# Patient Record
Sex: Female | Born: 1940
Health system: Southern US, Community
[De-identification: ages and names within clinical notes are randomized; demographics above are authoritative.]

## PROBLEM LIST (undated history)

## (undated) DIAGNOSIS — F411 Generalized anxiety disorder: Secondary | ICD-10-CM

## (undated) DIAGNOSIS — I10 Essential (primary) hypertension: Secondary | ICD-10-CM

## (undated) DIAGNOSIS — J189 Pneumonia, unspecified organism: Secondary | ICD-10-CM

## (undated) DIAGNOSIS — F329 Major depressive disorder, single episode, unspecified: Secondary | ICD-10-CM

## (undated) DIAGNOSIS — N289 Disorder of kidney and ureter, unspecified: Secondary | ICD-10-CM

## (undated) DIAGNOSIS — F419 Anxiety disorder, unspecified: Secondary | ICD-10-CM

## (undated) DIAGNOSIS — M47812 Spondylosis without myelopathy or radiculopathy, cervical region: Secondary | ICD-10-CM

## (undated) DIAGNOSIS — F32A Depression, unspecified: Secondary | ICD-10-CM

## (undated) DIAGNOSIS — H269 Unspecified cataract: Secondary | ICD-10-CM

## (undated) HISTORY — DX: Major depressive disorder, single episode, unspecified: F32.9

## (undated) HISTORY — PX: EYE SURGERY: SHX253

## (undated) HISTORY — DX: Depression, unspecified: F32.A

## (undated) HISTORY — DX: Disorder of kidney and ureter, unspecified: N28.9

## (undated) HISTORY — DX: Generalized anxiety disorder: F41.1

## (undated) HISTORY — PX: TONSILLECTOMY: SUR1361

## (undated) HISTORY — DX: Anxiety disorder, unspecified: F41.9

## (undated) HISTORY — DX: Unspecified cataract: H26.9

## (undated) HISTORY — PX: CLAVICLE SURGERY: SHX598

## (undated) HISTORY — DX: Essential (primary) hypertension: I10

## (undated) HISTORY — DX: Spondylosis without myelopathy or radiculopathy, cervical region: M47.812

## (undated) HISTORY — PX: COLONOSCOPY: SHX174

---

## 1998-01-04 ENCOUNTER — Other Ambulatory Visit: Admission: RE | Admit: 1998-01-04 | Discharge: 1998-01-04 | Payer: Self-pay | Admitting: *Deleted

## 1998-11-09 ENCOUNTER — Ambulatory Visit (HOSPITAL_COMMUNITY): Admission: RE | Admit: 1998-11-09 | Discharge: 1998-11-09 | Payer: Self-pay | Admitting: Gastroenterology

## 1999-04-19 ENCOUNTER — Other Ambulatory Visit: Admission: RE | Admit: 1999-04-19 | Discharge: 1999-04-19 | Payer: Self-pay | Admitting: *Deleted

## 2000-01-24 ENCOUNTER — Encounter: Admission: RE | Admit: 2000-01-24 | Discharge: 2000-01-24 | Payer: Self-pay | Admitting: *Deleted

## 2000-01-24 ENCOUNTER — Encounter: Payer: Self-pay | Admitting: *Deleted

## 2000-10-31 ENCOUNTER — Other Ambulatory Visit: Admission: RE | Admit: 2000-10-31 | Discharge: 2000-10-31 | Payer: Self-pay | Admitting: *Deleted

## 2001-05-15 ENCOUNTER — Encounter: Payer: Self-pay | Admitting: *Deleted

## 2001-05-15 ENCOUNTER — Encounter: Admission: RE | Admit: 2001-05-15 | Discharge: 2001-05-15 | Payer: Self-pay | Admitting: *Deleted

## 2001-10-16 ENCOUNTER — Other Ambulatory Visit: Admission: RE | Admit: 2001-10-16 | Discharge: 2001-10-16 | Payer: Self-pay | Admitting: *Deleted

## 2002-11-05 ENCOUNTER — Other Ambulatory Visit: Admission: RE | Admit: 2002-11-05 | Discharge: 2002-11-05 | Payer: Self-pay | Admitting: Family Medicine

## 2002-11-24 ENCOUNTER — Encounter: Admission: RE | Admit: 2002-11-24 | Discharge: 2002-11-24 | Payer: Self-pay

## 2003-10-05 ENCOUNTER — Other Ambulatory Visit: Admission: RE | Admit: 2003-10-05 | Discharge: 2003-10-05 | Payer: Self-pay | Admitting: Obstetrics and Gynecology

## 2011-11-13 ENCOUNTER — Other Ambulatory Visit: Payer: Self-pay | Admitting: Family Medicine

## 2011-11-13 NOTE — Telephone Encounter (Signed)
Pt left medicines in [place],  Needs a few pills to get through to Friday when she can come to see Dr. Charna Elizabeth 50-12.5  [facility]  Phone [phone]

## 2011-11-18 ENCOUNTER — Other Ambulatory Visit: Payer: Self-pay | Admitting: Family Medicine

## 2011-11-30 ENCOUNTER — Encounter: Payer: Self-pay | Admitting: Physician Assistant

## 2011-11-30 ENCOUNTER — Ambulatory Visit (INDEPENDENT_AMBULATORY_CARE_PROVIDER_SITE_OTHER): Payer: Medicare Other | Admitting: Physician Assistant

## 2011-11-30 VITALS — BP 146/85 | HR 68 | Temp 97.8°F | Resp 18 | Ht 61.5 in | Wt 139.0 lb

## 2011-11-30 DIAGNOSIS — N289 Disorder of kidney and ureter, unspecified: Secondary | ICD-10-CM | POA: Insufficient documentation

## 2011-11-30 DIAGNOSIS — I10 Essential (primary) hypertension: Secondary | ICD-10-CM

## 2011-11-30 DIAGNOSIS — F341 Dysthymic disorder: Secondary | ICD-10-CM

## 2011-11-30 DIAGNOSIS — F329 Major depressive disorder, single episode, unspecified: Secondary | ICD-10-CM

## 2011-11-30 LAB — POCT URINALYSIS DIPSTICK
Bilirubin, UA: NEGATIVE
Ketones, UA: NEGATIVE
Leukocytes, UA: NEGATIVE
Spec Grav, UA: 1.01

## 2011-11-30 LAB — POCT UA - MICROSCOPIC ONLY
Bacteria, U Microscopic: NEGATIVE
Crystals, Ur, HPF, POC: NEGATIVE
WBC, Ur, HPF, POC: NEGATIVE

## 2011-11-30 LAB — POCT CBC
Granulocyte percent: 52.8 %G (ref 37–80)
HCT, POC: 38.5 % (ref 37.7–47.9)
Hemoglobin: 12.3 g/dL (ref 12.2–16.2)
Lymph, poc: 2.6 (ref 0.6–3.4)
MCH, POC: 30.7 pg (ref 27–31.2)
MCHC: 31.9 g/dL (ref 31.8–35.4)
MCV: 96 fL (ref 80–97)
MID (cbc): 0.9 (ref 0–0.9)
MPV: 8.3 fL (ref 0–99.8)
POC Granulocyte: 3.9 (ref 2–6.9)
POC LYMPH PERCENT: 35.7 %L (ref 10–50)
POC MID %: 11.5 %M (ref 0–12)
Platelet Count, POC: 330 10*3/uL (ref 142–424)
RBC: 4.01 M/uL — AB (ref 4.04–5.48)
RDW, POC: 12.5 %
WBC: 7.4 10*3/uL (ref 4.6–10.2)

## 2011-11-30 MED ORDER — LOSARTAN POTASSIUM-HCTZ 50-12.5 MG PO TABS: 1.0000 | ORAL_TABLET | Freq: Every day | ORAL | Status: DC

## 2011-11-30 MED ORDER — CITALOPRAM HYDROBROMIDE 40 MG PO TABS: 40.0000 mg | ORAL_TABLET | Freq: Every day | ORAL | Status: DC

## 2011-11-30 NOTE — Progress Notes (Signed)
Subjective:    Patient ID: Allison Chavez, female    DOB: Jan 20, 1941, 71 y.o.   MRN: [id number]  HPI  Presents for follow-up of HTN and anxiety and depression.  Last seen here 08/29/2010.  I last saw her in 09/2009. Had a mammogram last month. She discontinued the cholesterol medication after a physician friend told her she didn't need it and a physician here told her of the potential adverse effects of the statins. She took zolpidem briefly, but decided it "wasn't for me." Has been taking citalopram intermittently x 6-8 weeks, notices that she feels "wound up."  Would like to restart it, but doesn't plan to restart the tranxene if she doesn't have to. All three of her children are living in the [place] area now.  Her daughter is in the process of converting to Judaism, which the patient is happy with.  She has a great relationship with her granddaughters. She is still in relationship with her 3rd husband, though they have not lived together for some years. He's doing some neat work Agricultural consultant at [employer], non[place] [place] of the [place]. 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  Review of Systems No chest pain, SOB, HA, dizziness, vision change, N/V, diarrhea, dysuria, or rash.  Intermittent cramps in the outer aspects of both legs at night.     Objective:   Physical Exam  Vital signs noted. Well-developed, well nourished WF who is awake, alert and oriented, in NAD. HEENT: [place]/AT, PERRL, EOMI.  Sclera and conjunctiva are clear.  Funduscopic exam is normal bilaterally.EAC are patent, TMs are normal in appearance. Nasal mucosa is pink and moist. OP is clear. Neck: supple, non-tender, no lymphadenopathey, thyromegaly. Heart: RRR, no murmur Lungs: CTA Extremities: no cyanosis, clubbing or edema. Skin: warm and dry without rash.  Results for orders placed in visit on 11/30/11  POCT CBC      Component  Value Range   WBC 7.4  4.6 - 10.2 (K/uL)   Lymph, poc 2.6  0.6 - 3.4    POC LYMPH PERCENT 35.7  10 - 50 (%L)   MID (cbc) 0.9  0 - 0.9    POC MID % 11.5  0 - 12 (%M)   POC Granulocyte 3.9  2 - 6.9    Granulocyte percent 52.8  37 - 80 (%G)   RBC 4.01 (*) 4.04 - 5.48 (M/uL)   Hemoglobin 12.3  12.2 - 16.2 (g/dL)   HCT, POC 40.9  81.1 - 47.9 (%)   MCV 96.0  80 - 97 (fL)   MCH, POC 30.7  27 - 31.2 (pg)   MCHC 31.9  31.8 - 35.4 (g/dL)   RDW, POC 91.4     Platelet Count, POC 330  142 - 424 (K/uL)   MPV 8.3  0 - 99.8 (fL)  POCT UA - MICROSCOPIC ONLY      Component Value Range   WBC, Ur, HPF, POC neg     RBC, urine, microscopic 0-1     Bacteria, U Microscopic neg     Mucus, UA neg     Epithelial cells, urine per micros neg     Crystals, Ur, HPF, POC neg     Casts, Ur, LPF, POC neg     Yeast, UA neg    POCT URINALYSIS DIPSTICK      Component Value Range   Color, UA yellow     Clarity, UA  clear     Glucose, UA neg     Bilirubin, UA neg     Ketones, UA neg     Spec Grav, UA 1.010     Blood, UA trace-lysed     pH, UA 7.0     Protein, UA neg     Urobilinogen, UA 0.2     Nitrite, UA neg     Leukocytes, UA Negative          Assessment & Plan:   1. HTN (hypertension)  losartan-hydrochlorothiazide (HYZAAR) 50-12.5 MG per tablet, POCT CBC, POCT UA - Microscopic Only, POCT urinalysis dipstick, Comprehensive metabolic panel, Lipid panel, TSH  2. Anxiety and depression  citalopram (CELEXA) 40 MG tablet   Recheck and CPE in 3 months. If sBP remains above 140, will increase losartanHCT dose.

## 2011-11-30 NOTE — Patient Instructions (Signed)
Regular exercise, healthy eating, regular sunshine and fun!

## 2011-12-01 ENCOUNTER — Encounter: Payer: Self-pay | Admitting: Physician Assistant

## 2011-12-01 LAB — COMPREHENSIVE METABOLIC PANEL
ALT: 15 U/L (ref 0–35)
Albumin: 4.4 g/dL (ref 3.5–5.2)
CO2: 27 mEq/L (ref 19–32)
Calcium: 9.4 mg/dL (ref 8.4–10.5)
Chloride: 102 mEq/L (ref 96–112)
Glucose, Bld: 87 mg/dL (ref 70–99)
Potassium: 4.5 mEq/L (ref 3.5–5.3)
Sodium: 137 mEq/L (ref 135–145)
Total Protein: 6.8 g/dL (ref 6.0–8.3)

## 2011-12-01 LAB — LIPID PANEL
Cholesterol: 212 mg/dL — ABNORMAL HIGH (ref 0–200)
Triglycerides: 70 mg/dL (ref <150)
VLDL: 14 mg/dL (ref 0–40)

## 2011-12-05 NOTE — Progress Notes (Signed)
CPE scheduled with Dr. Audria Nine for 12/26/11. Allison Chavez

## 2011-12-26 ENCOUNTER — Encounter: Payer: Medicare Other | Admitting: Family Medicine

## 2012-02-18 ENCOUNTER — Telehealth: Payer: Self-pay

## 2012-02-18 NOTE — Telephone Encounter (Signed)
There should be 3 refills. If not we can authorize a refill to get her through until August, but have her check with the pharmacy.

## 2012-02-18 NOTE — Telephone Encounter (Signed)
PT CALLED AND STATES SHE WILL BE OUT OF GENERIC CELEXA BEFORE HER APPT ON 03/07/12. PT IS ASKING OF CHELLE CAN REFILL UNTIL THE APPT PT USES [facility] ON [address]

## 2012-02-19 NOTE — Telephone Encounter (Signed)
Called [facility] to check on refills, spoke with Rosanne Ashing she has refills and one is ready for her to pick up now. Spoke with pt advised to pick up RX

## 2012-03-07 ENCOUNTER — Ambulatory Visit (INDEPENDENT_AMBULATORY_CARE_PROVIDER_SITE_OTHER): Payer: Medicare Other | Admitting: Family Medicine

## 2012-03-07 ENCOUNTER — Encounter: Payer: Self-pay | Admitting: Family Medicine

## 2012-03-07 VITALS — BP 114/77 | HR 64 | Temp 97.5°F | Resp 16 | Ht 62.0 in | Wt 138.0 lb

## 2012-03-07 DIAGNOSIS — Z0000 Encounter for general adult medical examination without abnormal findings: Secondary | ICD-10-CM

## 2012-03-07 DIAGNOSIS — I10 Essential (primary) hypertension: Secondary | ICD-10-CM | POA: Insufficient documentation

## 2012-03-07 DIAGNOSIS — F341 Dysthymic disorder: Secondary | ICD-10-CM

## 2012-03-07 DIAGNOSIS — F418 Other specified anxiety disorders: Secondary | ICD-10-CM

## 2012-03-07 HISTORY — DX: Essential (primary) hypertension: I10

## 2012-03-07 LAB — POCT URINALYSIS DIPSTICK
Bilirubin, UA: NEGATIVE
Glucose, UA: NEGATIVE
Ketones, UA: NEGATIVE
Leukocytes, UA: NEGATIVE
Protein, UA: NEGATIVE

## 2012-03-07 LAB — IFOBT (OCCULT BLOOD): IFOBT: NEGATIVE

## 2012-03-07 MED ORDER — IBUPROFEN 600 MG PO TABS: 600.0000 mg | ORAL_TABLET | Freq: Three times a day (TID) | ORAL | Status: AC | PRN

## 2012-03-07 MED ORDER — ZOLPIDEM TARTRATE 5 MG PO TABS: 5.0000 mg | ORAL_TABLET | Freq: Every evening | ORAL | Status: DC | PRN

## 2012-03-07 NOTE — Patient Instructions (Signed)
Keeping You Healthy  Get These Tests  Blood Pressure- Have your blood pressure checked by your healthcare provider at least once a year.  Normal blood pressure is 120/80.  Weight- Have your body mass index (BMI) calculated to screen for obesity.  BMI is a measure of body fat based on height and weight.  You can calculate your own BMI at www.nhlbisupport.com/bmi/  Cholesterol- Have your cholesterol checked every year.  Diabetes- Have your blood sugar checked every year if you have high blood pressure, high cholesterol, a family history of diabetes or if you are overweight.  Pap Smear- Have a pap smear every 1 to 3 years if you have been sexually active.  If you are older than 65 and recent pap smears have been normal you may not need additional pap smears.  In addition, if you have had a hysterectomy  For benign disease additional pap smears are not necessary.  Mammogram-Yearly mammograms are essential for early detection of breast cancer  Screening for Colon Cancer- Colonoscopy starting at age 50. Screening may begin sooner depending on your family history and other health conditions.  Follow up colonoscopy as directed by your Gastroenterologist.  Screening for Osteoporosis- Screening begins at age 65 with bone density scanning, sooner if you are at higher risk for developing Osteoporosis.  Get these medicines  Calcium with Vitamin D- Your body requires 1200-1500 mg of Calcium a day and 800-1000 IU of Vitamin D a day.  You can only absorb 500 mg of Calcium at a time therefore Calcium must be taken in 2 or 3 separate doses throughout the day.  Hormones- Hormone therapy has been associated with increased risk for certain cancers and heart disease.  Talk to your healthcare provider about if you need relief from menopausal symptoms.  Aspirin- Ask your healthcare provider about taking Aspirin to prevent Heart Disease and Stroke.  Get these Immuniztions  Flu shot- Every fall  Pneumonia  shot- Once after the age of 65; if you are younger ask your healthcare provider if you need a pneumonia shot.  Tetanus- Every ten years.  Zostavax- Once after the age of 60 to prevent shingles.  Take these steps  Don't smoke- Your healthcare provider can help you quit. For tips on how to quit, ask your healthcare provider or go to www.smokefree.gov or call 1-800 QUIT-NOW.  Be physically active- Exercise 5 days a week for a minimum of 30 minutes.  If you are not already physically active, start slow and gradually work up to 30 minutes of moderate physical activity.  Try walking, dancing, bike riding, swimming, etc.  Eat a healthy diet- Eat a variety of healthy foods such as fruits, vegetables, whole grains, low fat milk, low fat cheeses, yogurt, lean meats, chicken, fish, eggs, dried beans, tofu, etc.  For more information go to www.thenutritionsource.org  Dental visit- Brush and floss teeth twice daily; visit your dentist twice a year.  Eye exam- Visit your Optometrist or Ophthalmologist yearly.  Drink alcohol in moderation- Limit alcohol intake to one drink or less a day.  Never drink and drive.  Depression- Your emotional health is as important as your physical health.  If you're feeling down or losing interest in things you normally enjoy, please talk to your healthcare provider.  Seat Belts- can save your life; always wear one  Smoke/Carbon Monoxide detectors- These detectors need to be installed on the appropriate level of your home.  Replace batteries at least once a year.  Violence- If anyone   is threatening or hurting you, please tell your healthcare provider.  Living Will/ Health care power of attorney- Discuss with your healthcare provider and family.    Depression  Depression is a strong emotion of feeling unhappy that can last for weeks, months, or even longer. Depression causes problems with the ability to function in life. It upsets your:   Relationships.   Sleep.     Eating habits.   Work habits.  HOME CARE  Take all medicine as told by your doctor.   Talk with a therapist, counselor, or friend.   Eat a healthy diet.   Exercise regularly.   Do not drink alcohol or use drugs.  GET HELP RIGHT AWAY IF: You start to have thoughts about hurting yourself or others. MAKE SURE YOU:  Understand these instructions.   Will watch your condition.   Will get help right away if you are not doing well or get worse.  Document Released: 08/18/2010 Document Revised: 07/05/2011 Document Reviewed: 08/18/2010 [facility] Patient Information 2012 [place], [place].   Anxiety and Panic Attacks Anxiety is your body's way of reacting to real danger or something you think is a danger. It may be fear or worry over a situation like losing your job. Sometimes the cause is not known. A panic attack is made up of physical signs like sweating, shaking, or chest pain. Anxiety and panic attacks may start suddenly. They may be strong. They may come at any time of day, even while sleeping. They may come at any time of life. Panic attacks are scary, but they do not harm you physically.  HOME CARE  Avoid any known causes of your anxiety.   Try to relax. Yoga may help. Tell yourself everything will be okay.   Exercise often.   Get expert advice and help (therapy) to stop anxiety or attacks from happening.   Avoid caffeine, alcohol, and drugs.   Only take medicine as told by your doctor.  GET HELP RIGHT AWAY IF:  Your attacks seem different than normal attacks.   Your problems are getting worse or concern you.  MAKE SURE YOU:  Understand these instructions.   Will watch your condition.   Will get help right away if you are not doing well or get worse.  Document Released: 08/18/2010 Document Revised: 07/05/2011 Document Reviewed: 08/18/2010 [facility] Patient Information 2012 [place], [place].

## 2012-03-18 NOTE — Progress Notes (Signed)
Subjective:    Patient ID: Allison Chavez, female    DOB: 1940/09/10, 71 y.o.   MRN: [id number]  HPI  This 71 y.o. Cauc female is here for Annual Wellness Visit- Medicare (no PAP). She has HTN  and depression with anxiety and is compliant with medications without side effects. She has a  history of elevated lipids.  She is a nonsmoker and does consume alcohol in moderate amount. She is not sexually active  though she is married.    Last PAP: ? (normal)- has been in last 10 years  Last MMG: 2013 (normal)  Last Colonoscopy: 2008 (normal)   Review of Systems  Constitutional: Negative for activity change, appetite change, fatigue and unexpected weight change.  Eyes: Negative for visual disturbance.  Respiratory: Negative for chest tightness and shortness of breath.   Cardiovascular: Negative for chest pain and palpitations.  Gastrointestinal: Negative for abdominal pain.  Neurological: Negative for dizziness, syncope, weakness, light-headedness, numbness and headaches.  All other systems reviewed and are negative.       Objective:   Physical Exam  Nursing note and vitals reviewed. Constitutional: She is oriented to person, place, and time. She appears well-developed and well-nourished. No distress.  HENT:  Head: Normocephalic and atraumatic.  Right Ear: Hearing, tympanic membrane, external ear and ear canal normal.  Left Ear: Hearing, tympanic membrane, external ear and ear canal normal.  Nose: Nose normal. No nasal deformity or septal deviation.  Mouth/Throat: Uvula is midline, oropharynx is clear and moist and mucous membranes are normal. No oral lesions. No dental caries.  Eyes: Conjunctivae, EOM and lids are normal. Pupils are equal, round, and reactive to light. Right eye exhibits no discharge. Left eye exhibits no discharge. Right conjunctiva is not injected. Left conjunctiva is not injected. No scleral icterus.  Fundoscopic exam:      The right eye shows no exudate, no  hemorrhage and no papilledema.       The left eye shows no exudate, no hemorrhage and no papilledema.  Neck: Normal range of motion. Neck supple. No JVD present. No thyromegaly present.  Cardiovascular: Normal rate, regular rhythm, normal heart sounds and intact distal pulses.  Exam reveals no gallop and no friction rub.   No murmur heard. Pulmonary/Chest: Effort normal and breath sounds normal. No respiratory distress. She has no rales. Right breast exhibits no inverted nipple, no mass, no nipple discharge, no skin change and no tenderness. Left breast exhibits no inverted nipple, no mass, no nipple discharge, no skin change and no tenderness.  Abdominal: Soft. Bowel sounds are normal. She exhibits no distension and no mass. There is no hepatosplenomegaly. There is no tenderness. There is no guarding and no CVA tenderness.  Genitourinary: Rectal exam shows external hemorrhoid. Rectal exam shows no fissure, no mass, no tenderness and anal tone normal. Guaiac negative stool. There is no rash, tenderness or lesion on the right labia. There is no rash, tenderness or lesion on the left labia.  Musculoskeletal: Normal range of motion. She exhibits no edema and no tenderness.  Lymphadenopathy:    She has no cervical adenopathy.  Neurological: She is alert and oriented to person, place, and time. She has normal reflexes. No cranial nerve deficit. She exhibits normal muscle tone. Coordination normal.  Skin: Skin is warm and dry. No rash noted. No pallor.  Psychiatric: She has a normal mood and affect. Her behavior is normal. Judgment and thought content normal.    BECK'S SCALE: score= 1  Assessment & Plan:   1. Routine general medical examination at a health care facility  POCT urinalysis dipstick, IFOBT POC (occult bld, rslt in office)  2. HTN (hypertension) - stable Continue Losartan- HCTZ  50-12.5 mg daily CMET in May 2013- normal labs Lipids in May 2013- elevated TC=212 , HDL= 89 LDL=109    3. Depression with anxiety  Continue Citalopram 40 mg daily

## 2012-04-09 ENCOUNTER — Other Ambulatory Visit: Payer: Self-pay | Admitting: Physician Assistant

## 2012-05-25 ENCOUNTER — Ambulatory Visit (INDEPENDENT_AMBULATORY_CARE_PROVIDER_SITE_OTHER): Payer: Medicare Other | Admitting: Family Medicine

## 2012-05-25 VITALS — BP 153/76 | HR 66 | Temp 98.7°F | Resp 16 | Ht 62.0 in | Wt 138.0 lb

## 2012-05-25 DIAGNOSIS — S61209A Unspecified open wound of unspecified finger without damage to nail, initial encounter: Secondary | ICD-10-CM

## 2012-05-25 DIAGNOSIS — M79609 Pain in unspecified limb: Secondary | ICD-10-CM

## 2012-05-25 DIAGNOSIS — M79646 Pain in unspecified finger(s): Secondary | ICD-10-CM

## 2012-05-25 NOTE — Progress Notes (Signed)
  Subjective:    Patient ID: Allison Chavez, female    DOB: [birth year], 71 y.o.   MRN: [id number]  HPI Allison Chavez is a 71 y.o. female  Today at 2 pm - trying to cut out pit of [place]. Knife cut under knuckle on undersurface of L 3rd finger. Trouble in keeping it from bleeding - with pressure stops, then restarts.  Not taking any blood thinners.  R hand dominant. Last tetanus within 10/25/09.    Review of Systems  Constitutional: Negative for fever and chills.  Skin: Positive for wound. Negative for rash.       Objective:   Physical Exam  Constitutional: She is oriented to person, place, and time. She appears well-developed and well-nourished.  HENT:  Head: Atraumatic.  Pulmonary/Chest: Effort normal.  Musculoskeletal:       Hands: Neurological: She is alert and oriented to person, place, and time.       nvi distally -   Skin: Skin is warm and dry.  Psychiatric: She has a normal mood and affect. Her behavior is normal.          Assessment & Plan:  Allison Chavez is a 71 y.o. female 1. Pain in finger   2. Wound, open, finger    Up to date on td.  Repaired per procedure note.  Aftercare and rtc precautions discussed. Anticipate sr in 10 days  Patient Instructions  See wound care below.  Keep clean/covered at work, but open to air during most of day when not in dirty environment. Return to the clinic or go to the nearest emergency room if any of your symptoms worsen or new symptoms occur.    WOUND CARE Please return in 10 days to have your stitches/staples removed or sooner if you have concerns. Marland Kitchen Keep area clean and dry for 24 hours. Do not remove bandage, if applied. . After 24 hours, remove bandage and wash wound gently with mild soap and warm water. Reapply a new bandage after cleaning wound, if directed. . Continue daily cleansing with soap and water until stitches/staples are removed. . Do not apply any ointments or creams to the wound while stitches/staples  are in place, as this may cause delayed healing. . Notify the office if you experience any of the following signs of infection: Swelling, redness, pus drainage, streaking, fever >101.0 F . Notify the office if you experience excessive bleeding that does not stop after 15-20 minutes of constant, firm pressure.

## 2012-05-25 NOTE — Patient Instructions (Addendum)
See wound care below.  Keep clean/covered at work, but open to air during most of day when not in dirty environment. Return to the clinic or go to the nearest emergency room if any of your symptoms worsen or new symptoms occur.    WOUND CARE Please return in 10 days to have your stitches/staples removed or sooner if you have concerns. Marland Kitchen Keep area clean and dry for 24 hours. Do not remove bandage, if applied. . After 24 hours, remove bandage and wash wound gently with mild soap and warm water. Reapply a new bandage after cleaning wound, if directed. . Continue daily cleansing with soap and water until stitches/staples are removed. . Do not apply any ointments or creams to the wound while stitches/staples are in place, as this may cause delayed healing. . Notify the office if you experience any of the following signs of infection: Swelling, redness, pus drainage, streaking, fever >101.0 F . Notify the office if you experience excessive bleeding that does not stop after 15-20 minutes of constant, firm pressure.

## 2012-05-26 NOTE — Progress Notes (Signed)
Patient ID: ANNALYSA MOHAMMAD MRN: [id number], DOB: Jan 16, 1941, 71 y.o. Date of Encounter: 05/26/2012, 8:03 AM   PROCEDURE NOTE: Verbal consent obtained. Sterile technique employed. Numbing: Anesthesia obtained with 2% lidocaine plain.  Cleansed with soap and water. Irrigated.  Wound explored, no deep structures involved, no foreign bodies.   Wound repaired with # 1 HM.  Hemostasis obtained. Wound cleansed and dressed.  Wound care instructions including precautions covered with patient. Handout given.  Anticipate suture removal in 10 days  Rhoderick Moody, PA-C 05/26/2012 8:03 AM

## 2012-06-04 ENCOUNTER — Ambulatory Visit (INDEPENDENT_AMBULATORY_CARE_PROVIDER_SITE_OTHER): Payer: Medicare Other | Admitting: Emergency Medicine

## 2012-06-04 VITALS — BP 116/73 | HR 73 | Temp 98.7°F | Resp 16

## 2012-06-04 DIAGNOSIS — Z4889 Encounter for other specified surgical aftercare: Secondary | ICD-10-CM

## 2012-06-04 NOTE — Progress Notes (Signed)
  Subjective:    Patient ID: Allison Chavez, female    DOB: 10-15-1940, 71 y.o.   MRN: [id number]  Suture / Staple Removal The sutures were placed 7 to 10 days ago. She tried regular soap and water washings since the wound repair. The treatment provided significant relief. Her temperature was unmeasured prior to arrival. There has been no drainage from the wound. There is no redness present. There is no swelling present. The pain has no pain.      Review of Systems  Constitutional: Negative.   Musculoskeletal: Negative.   Skin: Negative.        Objective:   Physical Exam  Constitutional: She appears well-developed and well-nourished.  Musculoskeletal: Normal range of motion.  Skin: Skin is warm and dry. No rash noted. No erythema.          Assessment & Plan:   One suture removed from finger.  FULL ROM

## 2012-06-05 NOTE — Progress Notes (Signed)
Reviewed and agree.

## 2012-06-06 ENCOUNTER — Encounter: Payer: Self-pay | Admitting: Family Medicine

## 2012-06-06 ENCOUNTER — Ambulatory Visit (INDEPENDENT_AMBULATORY_CARE_PROVIDER_SITE_OTHER): Payer: Medicare Other | Admitting: Family Medicine

## 2012-06-06 VITALS — BP 126/82 | HR 61 | Temp 98.4°F | Resp 16 | Ht 61.75 in | Wt 137.2 lb

## 2012-06-06 DIAGNOSIS — F329 Major depressive disorder, single episode, unspecified: Secondary | ICD-10-CM

## 2012-06-06 DIAGNOSIS — F32A Depression, unspecified: Secondary | ICD-10-CM

## 2012-06-06 DIAGNOSIS — R1011 Right upper quadrant pain: Secondary | ICD-10-CM

## 2012-06-06 DIAGNOSIS — I10 Essential (primary) hypertension: Secondary | ICD-10-CM

## 2012-06-06 DIAGNOSIS — Z23 Encounter for immunization: Secondary | ICD-10-CM

## 2012-06-06 MED ORDER — CITALOPRAM HYDROBROMIDE 40 MG PO TABS: ORAL_TABLET | ORAL | Status: DC

## 2012-06-11 NOTE — Progress Notes (Signed)
S:  Pt is here for Depression and HTN follow-up; no problems with current medication and no new cardiovascular symptoms. Current Citalopram dose is currently 60 mg which works well for pt.   Main complaint today is achiness in RUQ and right flank for several months. It is intermittent and may be related to certain foods though pt cannot say for sure. She tries to avoid fatty and greasy foods. She has not noticed any change in color of stools (but not really looking at [facility] color). She denies BRBPR and melena. She has some mild nausea. She denies fever/ chills, epig pain, dyspepsia, reflux, dysuria or other GU symptoms. She does not recall any acute trauma or overexertion.  ROS: As per HPI.  O: Filed Vitals:   06/06/12 0831  BP: 126/82  Pulse: 61  Temp: 98.4 F (36.9 C)  Resp: 16   GEN: In NAD; WN,WD. HEENT: [place]/AT; EOMI w/ clear conj/scl. Oroph benign. NECK: No LAN or TMG. BACK: No CVAT. COR: RRR. LUNGS: Normal resp rate and effort. ABD: Normal BS; soft with mild RUQ tenderness; no distention,rebound or guarding  Murphy's sign equivocal. No HSM . NEURO: A&O x 3; CNs intact. Otherwise nonfocal. Mentation- pleasant mood and appropriate affect.  A/P: 1. Abdominal pain, acute, right upper quadrant  US Abdomen Limited RUQ  2. Depression - stable. Continue Citalopram 60 mg daily  3. HTN (hypertension) -stable Continue Losartan- HCTZ 50-12.5 mg 1 tablet daily  4. Need for prophylactic vaccination and inoculation against influenza  Flu vaccine greater than or equal to 3yo preservative free IM

## 2012-06-12 ENCOUNTER — Ambulatory Visit
Admission: RE | Admit: 2012-06-12 | Discharge: 2012-06-12 | Disposition: A | Discharge status: Home / Self Care | Payer: Medicare Other | Source: Ambulatory Visit | Attending: Family Medicine | Admitting: Family Medicine

## 2012-06-12 DIAGNOSIS — R1011 Right upper quadrant pain: Secondary | ICD-10-CM

## 2012-06-13 NOTE — Progress Notes (Signed)
Quick Note:  Your recent imaging study (abd ultrasound) is normal. ______

## 2012-09-27 ENCOUNTER — Other Ambulatory Visit: Payer: Self-pay | Admitting: Family Medicine

## 2012-09-27 NOTE — Telephone Encounter (Signed)
Please pull paper chart. Not on med list

## 2012-09-29 NOTE — Telephone Encounter (Signed)
Chart pulled to PA pool [id number]

## 2012-09-29 NOTE — Telephone Encounter (Signed)
Please advise on this refill. Last seen by Dr. Audria Nine but this medication was prescribed by Dr. Milus Glazier in 07/2010. Likely will need a OV, but will leave to your discretion.

## 2012-10-01 ENCOUNTER — Telehealth: Payer: Self-pay | Admitting: Radiology

## 2012-10-01 NOTE — Telephone Encounter (Signed)
Please advise on Ambien 5mg  rx for patient

## 2012-10-02 MED ORDER — ZOLPIDEM TARTRATE 5 MG PO TABS: 5.0000 mg | ORAL_TABLET | Freq: Every evening | ORAL | Status: DC | PRN

## 2012-10-02 NOTE — Telephone Encounter (Signed)
I called Zolpidem refill to pt's pharmacy. You can notify her.

## 2012-10-03 ENCOUNTER — Ambulatory Visit: Payer: Medicare Other | Admitting: Family Medicine

## 2012-10-29 ENCOUNTER — Ambulatory Visit (INDEPENDENT_AMBULATORY_CARE_PROVIDER_SITE_OTHER): Payer: Medicare Other | Admitting: Family Medicine

## 2012-10-29 ENCOUNTER — Encounter: Payer: Self-pay | Admitting: Family Medicine

## 2012-10-29 VITALS — BP 118/72 | HR 77 | Temp 98.0°F | Resp 16 | Ht 61.5 in | Wt 137.0 lb

## 2012-10-29 DIAGNOSIS — Z760 Encounter for issue of repeat prescription: Secondary | ICD-10-CM

## 2012-10-29 DIAGNOSIS — I10 Essential (primary) hypertension: Secondary | ICD-10-CM

## 2012-10-29 MED ORDER — LOSARTAN POTASSIUM-HCTZ 50-12.5 MG PO TABS: 1.0000 | ORAL_TABLET | Freq: Every day | ORAL | Status: DC

## 2012-10-31 NOTE — Progress Notes (Signed)
S:  This 72 y.o. Cauc female has HTN and returns for f/u. She is compliant w/ medication and does not report any side effects. 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 She works part-time but is considering hour reduction; she continues to work mainly for financial reasons. Her mortgage "won't be paid off until age 15"; she prefers to live alone. Her husband maintains a separate household near his job in the music department at a local community college. Pt denies fatigue, diaphoresis, abnormal weight change, CP or tightness, palpitations, edema, SOB or DOE, cough, n/v, constipation, myalgias, HA, dizziness, numbness, weakness or syncope. No mental status changes.  ROS: AS per HPI.  Filed Vitals:   10/29/12 1145  BP: 118/72  Pulse: 77  Temp: 98 F (36.7 C)  Resp: 16   GEN: In NAD; WN,WD. HENT: [place]/AT; EOMI w/ clear conj/ sclerae. EACs/nose/ oroph normal. COR: RRR; no m/g/r. LUNGS: CTA; normal resp rate and effort. SKIN: W&D; no pallor. NEURO: A&O x 3; CNs intact. Nonfocal.  A/P: HTN (hypertension) -  I am concerned that pt's BP is low normal; I have advised her to maintain adequate hydration.  Plan: losartan-hydrochlorothiazide (HYZAAR) 50-12.5 MG per tablet  Encounter for medication refill  Meds ordered this encounter  Medications  . losartan-hydrochlorothiazide (HYZAAR) 50-12.5 MG per tablet    Sig: Take 1 tablet by mouth daily.    Dispense:  90 tablet    Refill:  3

## 2013-02-04 ENCOUNTER — Other Ambulatory Visit: Payer: Self-pay | Admitting: Family Medicine

## 2013-03-08 ENCOUNTER — Other Ambulatory Visit: Payer: Self-pay | Admitting: Family Medicine

## 2013-03-09 NOTE — Telephone Encounter (Signed)
Clorazepate 7.5 mg refill phoned to pt's pharmacy.

## 2013-03-25 ENCOUNTER — Ambulatory Visit (INDEPENDENT_AMBULATORY_CARE_PROVIDER_SITE_OTHER): Payer: Medicare Other | Admitting: Family Medicine

## 2013-03-25 VITALS — BP 128/74 | HR 66 | Temp 98.2°F | Resp 18 | Ht 62.5 in | Wt 138.0 lb

## 2013-03-25 DIAGNOSIS — H01139 Eczematous dermatitis of unspecified eye, unspecified eyelid: Secondary | ICD-10-CM

## 2013-03-25 DIAGNOSIS — R21 Rash and other nonspecific skin eruption: Secondary | ICD-10-CM

## 2013-03-25 MED ORDER — DESONIDE 0.05 % EX CREA: TOPICAL_CREAM | Freq: Two times a day (BID) | CUTANEOUS | Status: DC

## 2013-03-25 NOTE — Progress Notes (Signed)
[facility]:  Office Visit  Chief Complaint:  Chief Complaint  Patient presents with  . Rash    forehead area off and on x10 days     HPI: Allison Chavez is a 72 y.o. female who complains of 10 day history of rash on both her eyelids, she doe snot know where it comes from. She has tried new soaps, new make up, shampoo but she has not been able to determine which one. She has not tried any medicines for this. She has a dermatology appt in October. She has no itching, no swelling.   Past Medical History  Diagnosis Date  . Hypertension   . Anxiety   . Depression   . DJD (degenerative joint disease) of cervical spine   . DJD of shoulder, right   . Renal insufficiency, mild   . Cataract    Past Surgical History  Procedure Laterality Date  . Tonsillectomy     History   Social History  . Marital Status: Married    Spouse Name: N/A    Number of Children: N/A  . Years of Education: N/A   Social History Main Topics  . Smoking status: Former Smoker -- 0.30 packs/day for 45 years    Types: Cigarettes    Quit date: 11/29/2001  . Smokeless tobacco: None  . Alcohol Use: Yes  . Drug Use: No  . Sexual Activity: Yes    Partners: Male    Birth Control/ Protection: Post-menopausal   Other Topics Concern  . None   Social History Narrative   Has not lived with husband (3rd marriage) for many years, though they maintain an intermittently sexual relationship. They talk almost daily.  He lives with his female partner.   Family History  Problem Relation Age of Onset  . Cancer Father     Brain  . Diverticulitis Father   . Diabetes Sister   . Cancer Sister     colon   Allergies  Allergen Reactions  . Ace Inhibitors     Cough   . Sulfa Antibiotics Hives  . Wellbutrin [Bupropion] Rash    Only with generic, immediate release   Prior to Admission medications   Medication Sig Start Date End Date Taking? Authorizing Provider  citalopram (CELEXA) 40 MG tablet Take  1 1/2 tablets daily or as directed. 06/06/12  Yes Maurice March, MD  clorazepate (TRANXENE) 7.5 MG tablet TAKE (1) TABLET DAILY AS NEEDED. 03/08/13  Yes Maurice March, MD  losartan-hydrochlorothiazide [facility]) 50-12.5 MG per tablet Take 1 tablet by mouth daily. 10/29/12  Yes Maurice March, MD  zolpidem (AMBIEN) 5 MG tablet TAKE 1 TABLET AT BEDTIME IF NEEDED FOR SLEEP. 02/04/13  Yes Maurice March, MD     ROS: The patient denies fevers, chills, night sweats, unintentional weight loss, chest pain, palpitations, wheezing, dyspnea on exertion, nausea, vomiting, abdominal pain, dysuria, hematuria, melena, numbness, weakness, or tingling.   All other systems have been reviewed and were otherwise negative with the exception of those mentioned in the HPI and as above.    PHYSICAL EXAM: Filed Vitals:   03/25/13 1441  BP: 128/74  Pulse: 66  Temp: 98.2 F (36.8 C)  Resp: 18   Filed Vitals:   03/25/13 1441  Height: 5' 2.5" (1.588 m)  Weight: 138 lb (62.596 kg)   Body mass index is 24.82 kg/(m^2).  General: Alert, no acute distress HEENT:  Normocephalic, atraumatic, oropharynx patent. EOMI, PERRLA Cardiovascular:  Regular  rate and rhythm, no rubs murmurs or gallops.  No Carotid bruits, radial pulse intact. No pedal edema.  Respiratory: Clear to auscultation bilaterally.  No wheezes, rales, or rhonchi.  No cyanosis, no use of accessory musculature GI: No organomegaly, abdomen is soft and non-tender, positive bowel sounds.  No masses. Skin: + eczematous rash on upper eyelids and also neck Neurologic: Facial musculature symmetric. Psychiatric: Patient is appropriate throughout our interaction. Lymphatic: No cervical lymphadenopathy Musculoskeletal: Gait intact.   LABS: Results for orders placed in visit on 03/07/12  POCT URINALYSIS DIPSTICK      Result Value Range   Color, UA pale yellow     Clarity, UA clear     Glucose, UA neg     Bilirubin, UA neg     Ketones, UA  neg     Spec Grav, UA <=1.005     Blood, UA small     pH, UA 6.5     Protein, UA neg     Urobilinogen, UA 0.2     Nitrite, UA neg     Leukocytes, UA Negative    IFOBT (OCCULT BLOOD)      Result Value Range   IFOBT Negative       EKG/XRAY:   Primary read interpreted by Dr. Conley Rolls at [facility].   ASSESSMENT/PLAN: Encounter Diagnoses  Name Primary?  . Eczematous dermatitis of eyelid, unspecified laterality Yes  . Rash and nonspecific skin eruption    Rx desonide cream, apply x 2 weeks max prn DC all new detergents, makeup, shampoo  Use hypoallergenic soaps, lotions Gross sideeffects, risk and benefits, and alternatives of medications d/w patient. Patient is aware that all medications have potential sideeffects and we are unable to predict every sideeffect or drug-drug interaction that may occur.  Lin Hackmann PHUONG, DO 03/25/2013 3:10 PM

## 2013-04-08 ENCOUNTER — Ambulatory Visit (INDEPENDENT_AMBULATORY_CARE_PROVIDER_SITE_OTHER): Payer: Medicare Other | Admitting: Family Medicine

## 2013-04-08 ENCOUNTER — Encounter: Payer: Self-pay | Admitting: Family Medicine

## 2013-04-08 VITALS — BP 130/82 | HR 60 | Temp 97.9°F | Resp 16 | Ht 61.75 in | Wt 136.6 lb

## 2013-04-08 DIAGNOSIS — Z139 Encounter for screening, unspecified: Secondary | ICD-10-CM

## 2013-04-08 DIAGNOSIS — Z0000 Encounter for general adult medical examination without abnormal findings: Secondary | ICD-10-CM

## 2013-04-08 DIAGNOSIS — F418 Other specified anxiety disorders: Secondary | ICD-10-CM

## 2013-04-08 DIAGNOSIS — I10 Essential (primary) hypertension: Secondary | ICD-10-CM

## 2013-04-08 LAB — POCT URINALYSIS DIPSTICK
Glucose, UA: NEGATIVE
Nitrite, UA: NEGATIVE
Urobilinogen, UA: 0.2

## 2013-04-08 LAB — BASIC METABOLIC PANEL
Calcium: 9 mg/dL (ref 8.4–10.5)
Sodium: 127 mEq/L — ABNORMAL LOW (ref 135–145)

## 2013-04-08 LAB — IFOBT (OCCULT BLOOD): IFOBT: NEGATIVE

## 2013-04-08 MED ORDER — CLORAZEPATE DIPOTASSIUM 7.5 MG PO TABS: ORAL_TABLET | ORAL | Status: DC

## 2013-04-08 MED ORDER — CITALOPRAM HYDROBROMIDE 40 MG PO TABS: ORAL_TABLET | ORAL | Status: DC

## 2013-04-08 MED ORDER — ZOLPIDEM TARTRATE 5 MG PO TABS: ORAL_TABLET | ORAL | Status: DC

## 2013-04-08 NOTE — Progress Notes (Signed)
Subjective:    Patient ID: Allison Chavez, female    DOB: November 16, 1940, 72 y.o.   MRN: [id number]  HPI  This 72 y.o Cauc female is here for [facility] Annual Subsequent CPE. She no longer gets  MMG or PAPs. Labs performed previously are reviewed and pt sees no need to repeat Lipids.  Immunizations are current. She has chronic anxiety disorder which she acknowledges requires  chronic medications. She thinks she is doing much better since resigning herself to taking meds.  No medication adverse reactions are reported. She completed HANDS Questionnaire for  Depression Screening w/ score= 0.  HCM: IMM- current.           CRS- current (2007; next due 2017).  Patient Active Problem List   Diagnosis Date Noted  . HTN (hypertension) 03/07/2012  . Depression with anxiety 03/07/2012  . Renal insufficiency, mild     PMHx, Soc Hx and Fam Hx reviewed.  Medications reconciled.   Review of Systems  Constitutional: Negative.   HENT: Negative.   Eyes: Negative.   Respiratory: Negative.   Cardiovascular: Negative.   Gastrointestinal: Negative.   Endocrine: Negative.   Genitourinary: Negative.   Musculoskeletal: Negative.   Skin: Negative.   Allergic/Immunologic: Negative.   Neurological: Negative.   Hematological: Negative.   Psychiatric/Behavioral: Negative.        Objective:   Physical Exam  Nursing note and vitals reviewed. Constitutional: She is oriented to person, place, and time. Vital signs are normal. She appears well-developed and well-nourished. No distress.  HENT:  Head: Normocephalic and atraumatic.  Right Ear: Hearing, external ear and ear canal normal. Tympanic membrane is scarred. Tympanic membrane is not erythematous and not bulging. No middle ear effusion. No decreased hearing is noted.  Left Ear: Hearing, external ear and ear canal normal. Tympanic membrane is scarred. Tympanic membrane is not erythematous and not bulging.  No middle ear effusion. No decreased hearing is noted.   Nose: Nose normal. No mucosal edema, rhinorrhea, nasal deformity or septal deviation.  Mouth/Throat: Uvula is midline, oropharynx is clear and moist and mucous membranes are normal. No oral lesions. Normal dentition.  Eyes: Conjunctivae, EOM and lids are normal. Pupils are equal, round, and reactive to light. No scleral icterus.  Fundoscopic exam:      The right eye shows no papilledema. The right eye shows red reflex.       The left eye shows no papilledema. The left eye shows red reflex.  Pt has periodic vision eval and no diseases found.  Neck: Trachea normal, normal range of motion, full passive range of motion without pain and phonation normal. Neck supple. No JVD present. No spinous process tenderness and no muscular tenderness present. Carotid bruit is not present. No erythema and normal range of motion present. No mass and no thyromegaly present.  Cardiovascular: Normal rate, regular rhythm, S1 normal, S2 normal, normal heart sounds and normal pulses.   No extrasystoles are present. PMI is not displaced.  Exam reveals no gallop, no distant heart sounds and no friction rub.   No murmur heard. Pulmonary/Chest: Effort normal and breath sounds normal. No respiratory distress. Right breast exhibits no inverted nipple, no mass, no nipple discharge, no skin change and no tenderness. Left breast exhibits no inverted nipple, no mass, no nipple discharge, no skin change and no tenderness. Breasts are symmetrical.  Abdominal: Soft. Normal appearance and normal aorta. She exhibits no distension, no abdominal bruit, no pulsatile midline mass and no mass. There is no  hepatosplenomegaly. There is no tenderness. There is no guarding and no CVA tenderness. No hernia.  Genitourinary: Rectum normal. Rectal exam shows no external hemorrhoid, no fissure, no mass, no tenderness and anal tone normal. Guaiac negative stool.  NEFG; pelvic exam deferred.  Musculoskeletal: Normal range of motion. She exhibits no edema  and no tenderness.  Lymphadenopathy:       Head (right side): No submental, no submandibular, no posterior auricular and no occipital adenopathy present.       Head (left side): No submental, no submandibular, no posterior auricular and no occipital adenopathy present.    She has no cervical adenopathy.    She has no axillary adenopathy.       Right: No inguinal and no supraclavicular adenopathy present.       Left: No inguinal and no supraclavicular adenopathy present.  Neurological: She is alert and oriented to person, place, and time. She has normal strength and normal reflexes. She is not disoriented. She displays no atrophy and no tremor. No cranial nerve deficit or sensory deficit. She exhibits normal muscle tone. Coordination and gait normal.  Skin: Skin is warm, dry and intact. No bruising, no ecchymosis and no rash noted. She is not diaphoretic. No cyanosis or erythema. No pallor. Nails show no clubbing.  Psychiatric: She has a normal mood and affect. Her speech is normal and behavior is normal. Judgment and thought content normal. Cognition and memory are normal.    Results for orders placed in visit on 04/08/13  IFOBT (OCCULT BLOOD)      Result Value Range   IFOBT Negative    POCT URINALYSIS DIPSTICK      Result Value Range   Color, UA yellow     Clarity, UA clear     Glucose, UA neg     Bilirubin, UA neg     Ketones, UA neg     Spec Grav, UA 1.010     Blood, UA trace     pH, UA 7.0     Protein, UA neg     Urobilinogen, UA 0.2     Nitrite, UA neg     Leukocytes, UA Negative      ECG: NSR; nonspecific ST-TW changes. No ectopy.     Assessment & Plan:  Routine general medical examination at a health care facility - Plan: POCT urinalysis dipstick  HTN (hypertension) -Stable and well controlled on current medications.   Plan: EKG 12-Lead, Basic metabolic panel  Depression with anxiety- Stable on current medications; no changes at this time.  Screening - Plan: IFOBT  POC (occult bld, rslt in office)  Meds ordered this encounter  Medications  . citalopram (CELEXA) 40 MG tablet    Sig: Take 1 1/2 tablets daily or as directed.    Dispense:  135 tablet    Refill:  3  . clorazepate (TRANXENE) 7.5 MG tablet    Sig: TAKE (1) TABLET DAILY AS NEEDED.    Dispense:  30 tablet    Refill:  3  . zolpidem (AMBIEN) 5 MG tablet    Sig: TAKE 1 TABLET AT BEDTIME IF NEEDED FOR SLEEP.    Dispense:  15 tablet    Refill:  3

## 2013-04-08 NOTE — Patient Instructions (Signed)
Keeping You Healthy  Get These Tests  Blood Pressure- Have your blood pressure checked by your healthcare provider at least once a year.  Normal blood pressure is 120/80.  Weight- Have your body mass index (BMI) calculated to screen for obesity.  BMI is a measure of body fat based on height and weight.  You can calculate your own BMI at https://www.west-esparza.com/  Cholesterol- Have your cholesterol checked every year.   Diabetes- Have your blood sugar checked every year if you have high blood pressure, high cholesterol, a family history of diabetes or if you are overweight.  Pap Smear- Have a pap smear every 1 to 3 years if you have been sexually active.  If you are older than 65 and recent pap smears have been normal you may not need additional pap smears.  In addition, if you have had a hysterectomy  For benign disease additional pap smears are not necessary.  Mammogram-Yearly mammograms are essential for early detection of breast cancer. You no longer need to have this test. I found no abnormality on breast exam today.  Screening for Colon Cancer- Colonoscopy starting at age 32. Screening may begin sooner depending on your family history and other health conditions.  Follow up colonoscopy as directed by your Gastroenterologist. Due in 2017.  Screening for Osteoporosis- Screening begins at age 29 with bone density scanning, sooner if you are at higher risk for developing Osteoporosis.  Get these medicines  Calcium with Vitamin D- Your body requires 1200-1500 mg of Calcium a day and [phone] IU of Vitamin D a day.  You can only absorb 500 mg of Calcium at a time therefore Calcium must be taken in 2 or 3 separate doses throughout the day. Continue taking this as you have Osteopenia.  Hormones- Hormone therapy has been associated with increased risk for certain cancers and heart disease.  Talk to your healthcare provider about if you need relief from menopausal symptoms.  Aspirin- Ask your  healthcare provider about taking Aspirin to prevent Heart Disease and Stroke.  Get these Immuniztions  Flu shot- Every fall Due in November if you decide to get this vaccine.  Pneumonia shot- Once after the age of 49; if you are younger ask your healthcare provider if you need a pneumonia shot. Done in 2008.  Tetanus- Every ten years. This is current.  Zostavax- Once after the age of 60 to prevent shingles. Done in 2011.  Take these steps  Don't smoke- Your healthcare provider can help you quit. For tips on how to quit, ask your healthcare provider or go to www.smokefree.gov or call 1-800 QUIT-NOW.  Be physically active- Exercise 5 days a week for a minimum of 30 minutes.  If you are not already physically active, start slow and gradually work up to 30 minutes of moderate physical activity.  Try walking, dancing, bike riding, swimming, etc.  Eat a healthy diet- Eat a variety of healthy foods such as fruits, vegetables, whole grains, low fat milk, low fat cheeses, yogurt, lean meats, chicken, fish, eggs, dried beans, tofu, etc.  For more information go to www.thenutritionsource.org  Dental visit- Brush and floss teeth twice daily; visit your dentist twice a year.  Eye exam- Visit your Optometrist or Ophthalmologist yearly.  Drink alcohol in moderation- Limit alcohol intake to one drink or less a day.  Never drink and drive.  Depression- Your emotional health is as important as your physical health.  If you're feeling down or losing interest in things you normally  enjoy, please talk to your healthcare provider.  Seat Belts- can save your life; always wear one  Smoke/Carbon Monoxide detectors- These detectors need to be installed on the appropriate level of your home.  Replace batteries at least once a year.  Violence- If anyone is threatening or hurting you, please tell your healthcare provider.  Living Will/ Health care power of attorney- Discuss with your healthcare provider and  family.

## 2013-04-12 NOTE — Progress Notes (Signed)
Quick Note:  Please contact pt and advise that the following labs are abnormal...  Sodium and chloride are below normal; advise pt that she can have a little more salt in her diet. All other labs are normal.  Copy to pt. ______

## 2013-04-13 ENCOUNTER — Encounter: Payer: Self-pay | Admitting: Family Medicine

## 2013-05-14 ENCOUNTER — Other Ambulatory Visit: Payer: Self-pay | Admitting: Family Medicine

## 2013-05-26 ENCOUNTER — Other Ambulatory Visit: Payer: Self-pay

## 2013-05-26 MED ORDER — IBUPROFEN 600 MG PO TABS: 600.0000 mg | ORAL_TABLET | Freq: Three times a day (TID) | ORAL | Status: DC | PRN

## 2013-05-26 NOTE — Telephone Encounter (Signed)
Ibuprofen refills authorized. Please notify pt.

## 2013-05-26 NOTE — Telephone Encounter (Signed)
Dr Audria Nine, you have seen pt recently, but I don't see ibuprofen 600 mg , take 1 tab TID prn on pt's med list. Do you want to RF for pt? I have pended it w/ 4 RFs for your review.

## 2013-05-27 NOTE — Telephone Encounter (Signed)
LMOM for pt that RFs were sent

## 2013-05-30 ENCOUNTER — Other Ambulatory Visit: Payer: Self-pay | Admitting: Family Medicine

## 2013-06-02 ENCOUNTER — Telehealth: Payer: Self-pay

## 2013-06-02 NOTE — Telephone Encounter (Signed)
Pharm requests RF of zolpidem 5 mg. Do you want to RF? I noticed that quantity is written for #15 which may be why pt is running out of RFs. Did you want #30 for 30 days, or was #15 for 30 intentional?

## 2013-06-09 MED ORDER — ZOLPIDEM TARTRATE 5 MG PO TABS: ORAL_TABLET | ORAL | Status: DC

## 2013-06-09 NOTE — Telephone Encounter (Signed)
I phoned refill for Zolpidem 5 mg  #30 w/ 3 refills to pt's pharmacy.

## 2013-08-31 ENCOUNTER — Ambulatory Visit (INDEPENDENT_AMBULATORY_CARE_PROVIDER_SITE_OTHER): Payer: Medicare Other | Admitting: Internal Medicine

## 2013-08-31 ENCOUNTER — Encounter: Payer: Self-pay | Admitting: Internal Medicine

## 2013-08-31 VITALS — BP 122/64 | HR 71 | Temp 97.8°F | Resp 16 | Ht 61.0 in | Wt 140.0 lb

## 2013-08-31 DIAGNOSIS — L309 Dermatitis, unspecified: Secondary | ICD-10-CM

## 2013-08-31 DIAGNOSIS — R21 Rash and other nonspecific skin eruption: Secondary | ICD-10-CM

## 2013-08-31 DIAGNOSIS — L42 Pityriasis rosea: Secondary | ICD-10-CM

## 2013-08-31 DIAGNOSIS — L259 Unspecified contact dermatitis, unspecified cause: Secondary | ICD-10-CM

## 2013-08-31 LAB — POCT SKIN KOH: SKIN KOH, POC: NEGATIVE

## 2013-08-31 MED ORDER — CETIRIZINE HCL 10 MG PO TABS: 10.0000 mg | ORAL_TABLET | Freq: Every day | ORAL | Status: DC

## 2013-08-31 NOTE — Progress Notes (Signed)
   Subjective:    Patient ID: Allison Chavez, female    DOB: 1941-03-18, 73 y.o.   MRN: [id number]  HPI Pt complains of pruritic rash on back, both legs, buttocks. She was seen by Dr. Marin Comment in August for a rash of the face, she states this is under control now, but seems to have spread. She was given Desonide cream for the face rash, which seemed to help. She states that some are irritated and "crusty". She does have a history of eczema, but does not think this is eczema. She states she would have gone to her Dermatologist; Dr. Radford Pax, but he recently retired.  The rash present for months. Patches and bumps trunk to thighs and now some distal extremitiy. Wants dermatology referral.   Review of Systems     Objective:   Physical Exam  Vitals reviewed. Constitutional: She is oriented to person, place, and time. She appears well-developed and well-nourished. No distress.  HENT:  Head: Normocephalic.  Eyes: EOM are normal.  Neck: Normal range of motion.  Cardiovascular: Normal rate.   Pulmonary/Chest: Effort normal.  Neurological: She is alert and oriented to person, place, and time. She exhibits normal muscle tone. Coordination normal.  Skin: Rash noted. Rash is maculopapular. There is erythema.  Discreet lesions in fern shape pattern across back. Many nodular lesions legs and arms--itchy red bumps.  Some scaling  KOH done  Psychiatric: She has a normal mood and affect. Her behavior is normal. Thought content normal.    Results for orders placed in visit on 08/31/13  POCT SKIN KOH      Result Value Range   Skin KOH, POC Negative           Assessment & Plan:  Scaly itchy plaques Nodular bumps red Refer to dermatology Dr. Christean Leaf group

## 2013-08-31 NOTE — Progress Notes (Signed)
   Subjective:    Patient ID: Allison Chavez, female    DOB: 02/28/1941, 73 y.o.   MRN: [id number]  HPI    Review of Systems     Objective:   Physical Exam        Assessment & Plan:

## 2013-08-31 NOTE — Patient Instructions (Signed)
Eczema Eczema, also called atopic dermatitis, is a skin disorder that causes inflammation of the skin. It causes a red rash and dry, scaly skin. The skin becomes very itchy. Eczema is generally worse during the cooler winter months and often improves with the warmth of summer. Eczema usually starts showing signs in infancy. Some children outgrow eczema, but it may last through adulthood.  CAUSES  The exact cause of eczema is not known, but it appears to run in families. People with eczema often have a family history of eczema, allergies, asthma, or hay fever. Eczema is not contagious. Flare-ups of the condition may be caused by:   Contact with something you are sensitive or allergic to.   Stress. SIGNS AND SYMPTOMS  Dry, scaly skin.   Red, itchy rash.   Itchiness. This may occur before the skin rash and may be very intense.  DIAGNOSIS  The diagnosis of eczema is usually made based on symptoms and medical history. TREATMENT  Eczema cannot be cured, but symptoms usually can be controlled with treatment and other strategies. A treatment plan might include:  Controlling the itching and scratching.   Use over-the-counter antihistamines as directed for itching. This is especially useful at night when the itching tends to be worse.   Use over-the-counter steroid creams as directed for itching.   Avoid scratching. Scratching makes the rash and itching worse. It may also result in a skin infection (impetigo) due to a break in the skin caused by scratching.   Keeping the skin well moisturized with creams every day. This will seal in moisture and help prevent dryness. Lotions that contain alcohol and water should be avoided because they can dry the skin.   Limiting exposure to things that you are sensitive or allergic to (allergens).   Recognizing situations that cause stress.   Developing a plan to manage stress.  HOME CARE INSTRUCTIONS   Only take over-the-counter or  prescription medicines as directed by your health care provider.   Do not use anything on the skin without checking with your health care provider.   Keep baths or showers short (5 minutes) in warm (not hot) water. Use mild cleansers for bathing. These should be unscented. You may add nonperfumed bath oil to the bath water. It is best to avoid soap and bubble bath.   Immediately after a bath or shower, when the skin is still damp, apply a moisturizing ointment to the entire body. This ointment should be a petroleum ointment. This will seal in moisture and help prevent dryness. The thicker the ointment, the better. These should be unscented.   Keep fingernails cut short. Children with eczema may need to wear soft gloves or mittens at night after applying an ointment.   Dress in clothes made of cotton or cotton blends. Dress lightly, because heat increases itching.   A child with eczema should stay away from anyone with fever blisters or cold sores. The virus that causes fever blisters (herpes simplex) can cause a serious skin infection in children with eczema. SEEK MEDICAL CARE IF:   Your itching interferes with sleep.   Your rash gets worse or is not better within 1 week after starting treatment.   You see pus or soft yellow scabs in the rash area.   You have a fever.   You have a rash flare-up after contact with someone who has fever blisters.  Document Released: 07/13/2000 Document Revised: 05/06/2013 Document Reviewed: 02/16/2013 [facility] Patient Information 2014 [place]. Pityriasis Rosea  Pityriasis rosea is a rash which is probably caused by a virus. It generally starts as a scaly, red patch on the trunk (the area of the body that a t-shirt would cover) but does not appear on sun exposed areas. The rash is usually preceded by an initial larger spot called the "herald patch" a week or more before the rest of the rash appears. Generally within one to two days the rash  appears rapidly on the trunk, upper arms, and sometimes the upper legs. The rash usually appears as flat, oval patches of scaly pink color. The rash can also be raised and one is able to feel it with a finger. The rash can also be finely crinkled and may slough off leaving a ring of scale around the spot. Sometimes a mild sore throat is present with the rash. It usually affects children and young adults in the spring and autumn. Women are more frequently affected than men. TREATMENT  Pityriasis rosea is a self-limited condition. This means it goes away within 4 to 8 weeks without treatment. The spots may persist for several months, especially in darker-colored skin after the rash has resolved and healed. Benadryl and steroid creams may be used if itching is a problem. SEEK MEDICAL CARE IF:   Your rash does not go away or persists longer than three months.  You develop fever and joint pain.  You develop severe headache and confusion.  You develop breathing difficulty, vomiting and/or extreme weakness. Document Released: 08/22/2001 Document Revised: 10/08/2011 Document Reviewed: 09/10/2008 [facility] Patient Information 2014 [place].

## 2013-10-07 ENCOUNTER — Encounter: Payer: Self-pay | Admitting: Family Medicine

## 2013-10-07 ENCOUNTER — Ambulatory Visit (INDEPENDENT_AMBULATORY_CARE_PROVIDER_SITE_OTHER): Payer: Medicare Other | Admitting: Family Medicine

## 2013-10-07 VITALS — BP 136/74 | HR 65 | Temp 97.7°F | Resp 16 | Ht 61.5 in | Wt 139.5 lb

## 2013-10-07 DIAGNOSIS — E7800 Pure hypercholesterolemia, unspecified: Secondary | ICD-10-CM

## 2013-10-07 DIAGNOSIS — F341 Dysthymic disorder: Secondary | ICD-10-CM

## 2013-10-07 DIAGNOSIS — I10 Essential (primary) hypertension: Secondary | ICD-10-CM

## 2013-10-07 DIAGNOSIS — E871 Hypo-osmolality and hyponatremia: Secondary | ICD-10-CM

## 2013-10-07 DIAGNOSIS — F418 Other specified anxiety disorders: Secondary | ICD-10-CM

## 2013-10-07 LAB — COMPLETE METABOLIC PANEL WITH GFR
ALT: 14 U/L (ref 0–35)
AST: 17 U/L (ref 0–37)
Albumin: 4 g/dL (ref 3.5–5.2)
Alkaline Phosphatase: 57 U/L (ref 39–117)
BILIRUBIN TOTAL: 0.8 mg/dL (ref 0.2–1.2)
BUN: 22 mg/dL (ref 6–23)
CO2: 25 mEq/L (ref 19–32)
CREATININE: 1.07 mg/dL (ref 0.50–1.10)
Calcium: 9 mg/dL (ref 8.4–10.5)
Chloride: 90 mEq/L — ABNORMAL LOW (ref 96–112)
GFR, Est African American: 61 mL/min
GFR, Est Non African American: 53 mL/min — ABNORMAL LOW
Glucose, Bld: 81 mg/dL (ref 70–99)
Potassium: 4.3 mEq/L (ref 3.5–5.3)
Sodium: 126 mEq/L — ABNORMAL LOW (ref 135–145)
Total Protein: 6.5 g/dL (ref 6.0–8.3)

## 2013-10-07 LAB — LIPID PANEL
CHOLESTEROL: 247 mg/dL — AB (ref 0–200)
HDL: 104 mg/dL (ref >39)
LDL CALC: 134 mg/dL — AB (ref 0–99)
TRIGLYCERIDES: 46 mg/dL (ref <150)
Total CHOL/HDL Ratio: 2.4 Ratio
VLDL: 9 mg/dL (ref 0–40)

## 2013-10-07 MED ORDER — LOSARTAN POTASSIUM-HCTZ 50-12.5 MG PO TABS: 1.0000 | ORAL_TABLET | Freq: Every day | ORAL | Status: DC

## 2013-10-07 NOTE — Patient Instructions (Signed)
For sleep- try over-the-counter MELATONIN 3 mg; it comes as a minty flavored melt that you put on your tongue at bedtime.  The brand is NATURE MADE.  It may be available at [facility]. If not, ask the pharmacist what brand would he/she recommend.  I will se you in 6 months for your annual Medicare physical exam.

## 2013-10-08 ENCOUNTER — Encounter: Payer: Self-pay | Admitting: Family Medicine

## 2013-10-08 DIAGNOSIS — G4700 Insomnia, unspecified: Secondary | ICD-10-CM

## 2013-10-08 HISTORY — DX: Insomnia, unspecified: G47.00

## 2013-10-08 NOTE — Progress Notes (Signed)
S:  This 73 y.o. Cauc female is here for HTN follow-up; medication compliance is excellent and she has no adverse med reactions. She has had low sodium in the past so she has liberalized her sodium intake. Nutrition is good and she is increasing activity in the yard as the weather warms. She denies any fatigue, diaphoresis, vision disturbances, CP or tightness, Palpitations, edema, SOB or DOE, cough, abd/ back pain, HA, dizziness, asymmetric numbness or weakness or syncope.  Pt has been seen by DERM and rash treated w/ steroid topical. Cetirizine was somewhat effective but made her sleepy. She continues to use Tranxene prn for intermittent insomnia. She is separated from her 3rd husband but they maintain a close relationship. He currently lives with hs female partner.Current antidepressant, Citalopram, is very effective. Pt has good mood, no crying spells, maintains pleasure in her daily activities, has no thoughts of hopelessness/ self harm, SI/HI. Pt is active outside the home.  Pt has mild lipid disorder but declines to start a statin.There is not a strong family hx of heart disease.  Patient Active Problem List   Diagnosis Date Noted  . HTN (hypertension) 03/07/2012  . Depression with anxiety 03/07/2012  . Renal insufficiency, mild     Prior to Admission medications   Medication Sig Start Date End Date Taking? Authorizing Provider  citalopram (CELEXA) 40 MG tablet Take 1 1/2 tablets daily or as directed. 04/08/13  Yes Barton Fanny, MD  clorazepate (TRANXENE) 7.5 MG tablet TAKE (1) TABLET DAILY AS NEEDED. 04/08/13  Yes Barton Fanny, MD  desonide (DESOWEN) 0.05 % cream Apply topically 2 (two) times daily. 03/25/13  Yes Thao P Le, DO  losartan-hydrochlorothiazide (HYZAAR) 50-12.5 MG per tablet Take 1 tablet by mouth daily. 10/07/13  Yes Barton Fanny, MD  triamcinolone cream (KENALOG) 0.1 % Apply 1 application topically 2 (two) times daily.   Yes Historical Provider, MD  zolpidem  (AMBIEN) 5 MG tablet TAKE 1 TABLET AT BEDTIME IF NEEDED FOR SLEEP. 06/09/13  Yes Barton Fanny, MD  cetirizine (ZYRTEC) 10 MG tablet Take 1 tablet (10 mg total) by mouth daily. 08/31/13   Orma Flaming, MD   PMHx, Surg Hx, Soc and Fam Hx reviewed.  ROS: As per HPI.  O: Filed Vitals:   10/07/13 0849  BP: 136/74  Pulse: 65  Temp: 97.7 F (36.5 C)  Resp: 16   GEN: in NAD; WN,WD. HENT: [place]/AT; EOMI w/ clear conj/sclerae. Otherwise unremarkable. NECK: Supple w/o LAN or TMG. COR: RRR. Normal S1 and S2. LUNGS: CTA; normal resp rate and effort. ABD: Normal appearnace; soft, NT, no guarding. No HSM or masses.  MS: MAEs; no deformities or atrophy. Trace ankle edema. NEURO: A&O x 3; CNs intact. Nonfocal.  A/P: HTN (hypertension) -  Stable and well controlled on current medications. Plan: losartan-hydrochlorothiazide (HYZAAR) 50-12.5 MG per tablet, COMPLETE METABOLIC PANEL WITH GFR  Hyponatremia- Recheck CMET.  Depression with anxiety- Stable on Citalopram 30-40 mg daily. Use Tranxene prn. Pt states she rarely uses Ambien for sleep.  Pure hypercholesterolemia - Plan: Lipid panel  Meds ordered this encounter  Medications          . losartan-hydrochlorothiazide (HYZAAR) 50-12.5 MG per tablet    Sig: Take 1 tablet by mouth daily.    Dispense:  90 tablet    Refill:  3

## 2013-10-09 NOTE — Progress Notes (Signed)
Quick Note:  Please advise pt regarding following labs... Your sodium is still low but all other results on the metabolic panel are normal. Return to the clinic within the next month so we can discuss this problem and what other evaluation you may need.  Your cholesterol numbers (total and LDL "bad") are above normal. But your HDL "good" cholesterol is so high = 104 that your heart disease risk remain below average!  Copy to pt. ______

## 2013-10-12 ENCOUNTER — Telehealth: Payer: Self-pay

## 2013-10-12 NOTE — Telephone Encounter (Signed)
Pt is returning our call for lab results

## 2013-12-09 ENCOUNTER — Encounter: Payer: Self-pay | Admitting: Family Medicine

## 2013-12-09 ENCOUNTER — Ambulatory Visit (INDEPENDENT_AMBULATORY_CARE_PROVIDER_SITE_OTHER): Payer: Medicare Other | Admitting: Family Medicine

## 2013-12-09 VITALS — BP 132/70 | HR 61 | Temp 98.2°F | Resp 16 | Ht 61.75 in | Wt 142.8 lb

## 2013-12-09 DIAGNOSIS — E871 Hypo-osmolality and hyponatremia: Secondary | ICD-10-CM

## 2013-12-09 NOTE — Progress Notes (Signed)
S:  This 73 y.o. Cauc female has HTN, well controlled and stable. She has chronically low sodium and admits drinking significant amounts of water. She is asymptomatic and takes Citalopram 1 tablet daily. She has depression and anxiety, which she has accepted and symptoms lessen during spring and summer months. Salt-restriction- pt is not as stringent.  Pt has appt w/ Dr. Katy Fitch but is concerned about small hard cyst on L eyelid.    Patient Active Problem List   Diagnosis Date Noted  . Insomnia 10/08/2013  . HTN (hypertension) 03/07/2012  . Depression with anxiety 03/07/2012  . Renal insufficiency, mild     Prior to Admission medications   Medication Sig Start Date End Date Taking? Authorizing Provider  cetirizine (ZYRTEC) 10 MG tablet Take 1 tablet (10 mg total) by mouth daily. 08/31/13  Yes Orma Flaming, MD  citalopram (CELEXA) 40 MG tablet Take 1 1/2 tablets daily or as directed. 04/08/13  Yes Barton Fanny, MD  clorazepate (TRANXENE) 7.5 MG tablet TAKE (1) TABLET DAILY AS NEEDED. 04/08/13  Yes Barton Fanny, MD  desonide (DESOWEN) 0.05 % cream Apply topically 2 (two) times daily. 03/25/13  Yes Thao P Le, DO  losartan-hydrochlorothiazide (HYZAAR) 50-12.5 MG per tablet Take 1 tablet by mouth daily. 10/07/13  Yes Barton Fanny, MD  triamcinolone cream (KENALOG) 0.1 % Apply 1 application topically 2 (two) times daily.   Yes Historical Provider, MD  zolpidem (AMBIEN) 5 MG tablet TAKE 1 TABLET AT BEDTIME IF NEEDED FOR SLEEP. 06/09/13  Yes Barton Fanny, MD   PMHX, Surg Hx, Soc and Fam Hx reviewed.  ROS: As per HPI.  O: Filed Vitals:   12/09/13 1021  BP: 132/70  Pulse: 61  Temp: 98.2 F (36.8 C)  Resp: 16   GEN: In NAD; WN, WD. HENT: [place]/AT; EOMI w/ clear conj/sclerae. L eye-Tiny pinpoint papule above lash line of upper eyelid. Otherwise unremarkable. COR: RRR. LUNGS: Unlabored resp. SKIN: W&D; intact w/ good turgor. No diaphoresis, erythema or pallor. NEURO: A7O  x 3; CNs intact. Nonfocal.  A/P: Hyponatremia - Suspect medication side effect (Citalopram) and salt restriction w/ excess water intake.         Plan: Basic metabolic panel

## 2013-12-09 NOTE — Patient Instructions (Signed)
Hyponatremia  Hyponatremia is when the salt (sodium) in your blood is low. When salt becomes low, your cells take in extra water and puff up (swell). The puffiness can happen in the whole body. It mostly affects the brain and is very serious.  HOME CARE  Only take medicine as told by your doctor.  Follow any diet instructions you were given. This includes limiting how much fluid you drink.  Keep all doctor visits for tests as told.  Avoid alcohol and drugs. GET HELP RIGHT AWAY IF:  You start to twitch and shake (seize).  You pass out (faint).  You continue to have watery poop (diarrhea) or you throw up (vomit).  You feel sick to your stomach (nauseous).  You are tired (fatigued), have a headache, are confused, or feel weak.  Your problems that first brought you to the doctor come back.  You have trouble following your diet instructions. MAKE SURE YOU:   Understand these instructions.  Will watch your condition.  Will get help right away if you are not doing well or get worse. Document Released: 03/28/2011 Document Revised: 10/08/2011 Document Reviewed: 03/28/2011 [employer] Patient Information 2014 [employer].  

## 2013-12-10 LAB — BASIC METABOLIC PANEL
BUN: 15 mg/dL (ref 6–23)
CALCIUM: 8.7 mg/dL (ref 8.4–10.5)
CHLORIDE: 96 meq/L (ref 96–112)
CO2: 25 meq/L (ref 19–32)
CREATININE: 1.02 mg/dL (ref 0.50–1.10)
Glucose, Bld: 84 mg/dL (ref 70–99)
Potassium: 4.3 mEq/L (ref 3.5–5.3)
Sodium: 131 mEq/L — ABNORMAL LOW (ref 135–145)

## 2013-12-10 NOTE — Progress Notes (Signed)
Quick Note:  Please advise pt regarding following labs... Sodium is almost back to normal. All other labs normal.  Copy to pt. ______

## 2013-12-18 ENCOUNTER — Telehealth: Payer: Self-pay | Admitting: Radiology

## 2013-12-18 NOTE — Telephone Encounter (Signed)
Called about lab results/ patient had left message on voice mil

## 2014-01-16 ENCOUNTER — Other Ambulatory Visit: Payer: Self-pay | Admitting: Family Medicine

## 2014-01-17 NOTE — Telephone Encounter (Signed)
Clorazepate 7.5 mg tablet  #30 w/ 1 RF phoned to pt's pharmacy.

## 2014-02-22 ENCOUNTER — Other Ambulatory Visit: Payer: Self-pay | Admitting: Family Medicine

## 2014-02-23 NOTE — Telephone Encounter (Signed)
Zolpidem refill phoned to pharmacy.

## 2014-02-26 ENCOUNTER — Ambulatory Visit (INDEPENDENT_AMBULATORY_CARE_PROVIDER_SITE_OTHER): Payer: Medicare Other | Admitting: Family Medicine

## 2014-02-26 VITALS — BP 126/70 | HR 69 | Temp 97.6°F | Resp 16 | Ht 61.5 in | Wt 141.8 lb

## 2014-02-26 DIAGNOSIS — R319 Hematuria, unspecified: Secondary | ICD-10-CM

## 2014-02-26 DIAGNOSIS — R309 Painful micturition, unspecified: Secondary | ICD-10-CM

## 2014-02-26 DIAGNOSIS — R350 Frequency of micturition: Secondary | ICD-10-CM

## 2014-02-26 DIAGNOSIS — N3090 Cystitis, unspecified without hematuria: Secondary | ICD-10-CM

## 2014-02-26 DIAGNOSIS — R300 Dysuria: Secondary | ICD-10-CM

## 2014-02-26 LAB — POCT UA - MICROSCOPIC ONLY
Bacteria, U Microscopic: NEGATIVE
Casts, Ur, LPF, POC: NEGATIVE
Crystals, Ur, HPF, POC: NEGATIVE
Epithelial cells, urine per micros: NEGATIVE
Mucus, UA: NEGATIVE
Yeast, UA: NEGATIVE

## 2014-02-26 LAB — POCT URINALYSIS DIPSTICK
Bilirubin, UA: NEGATIVE
Glucose, UA: NEGATIVE
Ketones, UA: NEGATIVE
Leukocytes, UA: NEGATIVE
Nitrite, UA: NEGATIVE
Protein, UA: NEGATIVE
Spec Grav, UA: 1.01
Urobilinogen, UA: 0.2
pH, UA: 8

## 2014-02-26 MED ORDER — CIPROFLOXACIN HCL 250 MG PO TABS: 250.0000 mg | ORAL_TABLET | Freq: Two times a day (BID) | ORAL | Status: DC

## 2014-02-26 NOTE — Progress Notes (Signed)
Chief Complaint:  Chief Complaint  Patient presents with  . Urinary Frequency    painful to urinate  x 2-3 days    HPI: Allison Chavez is a 73 y.o. female who is here for  3 day history of urinary frequency and urgency. She has tried otc cranberry juice. She is not getting better. Has had no fevers, chills, n/v/abd pain// She feels like it is going from her urethera up. She has no history of bladder prolapse. Has a hsitoryof cycstitis and has had a urethral widening due to stenosis in the past by Dr Gaynelle Arabian,.   Past Medical History  Diagnosis Date  . Hypertension   . Anxiety   . Depression   . DJD (degenerative joint disease) of cervical spine   . DJD of shoulder, right   . Renal insufficiency, mild   . Cataract    Past Surgical History  Procedure Laterality Date  . Tonsillectomy    . Eye surgery     History   Social History  . Marital Status: Married    Spouse Name: N/A    Number of Children: N/A  . Years of Education: N/A   Occupational History  . 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 Smoking status: Former Smoker -- 0.30 packs/day for 45 years    Types: Cigarettes    Quit date: 11/29/2001  . Smokeless tobacco: None  . Alcohol Use: Yes     Comment: 2-5  . Drug Use: No  . Sexual Activity: Yes    Partners: Male    Birth Control/ Protection: Post-menopausal   Other Topics Concern  . None   Social History Narrative   Has not lived with husband (3rd marriage) for many years, though they maintain an intermittently sexual relationship. They talk almost daily.  He lives with his female partner. Education: College/Other. Exercise: Walks 5 days a week for 1 hour.   Family History  Problem Relation Age of Onset  . Cancer Father     Brain  . Diverticulitis Father   . Diabetes Sister   . Cancer Sister     colon  . Cancer Maternal Grandfather   . Stroke Paternal Grandmother   . Heart disease Paternal Grandfather     Allergies  Allergen Reactions  . Ace Inhibitors     Cough   . Sulfa Antibiotics Hives  . Wellbutrin [Bupropion] Rash    Only with generic, immediate release   Prior to Admission medications   Medication Sig Start Date End Date Taking? Authorizing Provider  citalopram (CELEXA) 40 MG tablet Take 1 1/2 tablets daily or as directed. 04/08/13  Yes Barton Fanny, MD  clorazepate (TRANXENE) 7.5 MG tablet TAKE (1) TABLET DAILY AS NEEDED.   Yes Barton Fanny, MD  desonide (DESOWEN) 0.05 % cream Apply topically 2 (two) times daily. 03/25/13  Yes Azelie Noguera P Sharnise Blough, DO  losartan-hydrochlorothiazide (HYZAAR) 50-12.5 MG per tablet Take 1 tablet by mouth daily. 10/07/13  Yes Barton Fanny, MD  triamcinolone cream (KENALOG) 0.1 % Apply 1 application topically 2 (two) times daily.   Yes Historical Provider, MD  zolpidem (AMBIEN) 5 MG tablet TAKE 1 TABLET AT BEDTIME IF NEEDED FOR SLEEP.   Yes Barton Fanny, MD  cetirizine (ZYRTEC) 10 MG tablet Take 1 tablet (10 mg total) by mouth daily. 08/31/13   Orma Flaming, MD     ROS: The patient denies  fevers, chills, night sweats, unintentional weight loss, chest pain, palpitations, wheezing, dyspnea on exertion, nausea, vomiting, abdominal pain,  hematuria, melena, numbness, weakness, or tingling.   All other systems have been reviewed and were otherwise negative with the exception of those mentioned in the HPI and as above.    PHYSICAL EXAM: Filed Vitals:   02/26/14 1041  BP: 126/70  Pulse: 69  Temp: 97.6 F (36.4 C)  Resp: 16   Filed Vitals:   02/26/14 1041  Height: 5' 1.5" (1.562 m)  Weight: 141 lb 12.8 oz (64.32 kg)   Body mass index is 26.36 kg/(m^2).  General: Alert, no acute distress HEENT:  Normocephalic, atraumatic, oropharynx patent. EOMI, PERRLA Cardiovascular:  Regular rate and rhythm, no rubs murmurs or gallops.  No Carotid bruits, radial pulse intact. No pedal edema.  Respiratory: Clear to auscultation bilaterally.  No  wheezes, rales, or rhonchi.  No cyanosis, no use of accessory musculature GI: No organomegaly, abdomen is soft and non-tender, positive bowel sounds.  No masses. Skin: No rashes. Neurologic: Facial musculature symmetric. Psychiatric: Patient is appropriate throughout our interaction. Lymphatic: No cervical lymphadenopathy Musculoskeletal: Gait intact.   LABS: Results for orders placed in visit on 02/26/14  POCT URINALYSIS DIPSTICK      Result Value Ref Range   Color, UA light yellow     Clarity, UA clear     Glucose, UA neg     Bilirubin, UA neg     Ketones, UA neg     Spec Grav, UA 1.010     Blood, UA trace-intact     pH, UA 8.0     Protein, UA neg     Urobilinogen, UA 0.2     Nitrite, UA neg     Leukocytes, UA Negative    POCT UA - MICROSCOPIC ONLY      Result Value Ref Range   WBC, Ur, HPF, POC 1-2     RBC, urine, microscopic 1-2     Bacteria, U Microscopic neg     Mucus, UA neg     Epithelial cells, urine per micros neg     Crystals, Ur, HPF, POC neg     Casts, Ur, LPF, POC neg     Yeast, UA neg     Renal tubular cells         EKG/XRAY:   Primary read interpreted by Dr. Marin Comment at [facility].   ASSESSMENT/PLAN: Encounter Diagnoses  Name Primary?  . Urinary frequency   . Painful urination Yes  . Cystitis   . Hematuria    Rx Cipro with precautions since has sulfa allergies since symptomatic She will use for 3 days, urine cx pending  F/u prn   Gross sideeffects, risk and benefits, and alternatives of medications d/w patient. Patient is aware that all medications have potential sideeffects and we are unable to predict every sideeffect or drug-drug interaction that may occur.  Emilyrose Darrah, [place], DO 02/26/2014 11:53 AM

## 2014-02-26 NOTE — Patient Instructions (Signed)
AZO otc for bladder spasms

## 2014-02-27 LAB — URINE CULTURE
Colony Count: NO GROWTH
Organism ID, Bacteria: NO GROWTH

## 2014-03-02 ENCOUNTER — Telehealth: Payer: Self-pay | Admitting: Family Medicine

## 2014-03-02 NOTE — Telephone Encounter (Signed)
Lm about normal urin cx

## 2014-04-14 ENCOUNTER — Ambulatory Visit (INDEPENDENT_AMBULATORY_CARE_PROVIDER_SITE_OTHER): Payer: Medicare Other | Admitting: Family Medicine

## 2014-04-14 ENCOUNTER — Encounter: Payer: Self-pay | Admitting: Family Medicine

## 2014-04-14 VITALS — BP 112/76 | HR 67 | Temp 98.5°F | Resp 16 | Ht 61.75 in | Wt 139.8 lb

## 2014-04-14 DIAGNOSIS — Z0000 Encounter for general adult medical examination without abnormal findings: Secondary | ICD-10-CM

## 2014-04-14 DIAGNOSIS — I10 Essential (primary) hypertension: Secondary | ICD-10-CM

## 2014-04-14 DIAGNOSIS — G4700 Insomnia, unspecified: Secondary | ICD-10-CM

## 2014-04-14 DIAGNOSIS — Z760 Encounter for issue of repeat prescription: Secondary | ICD-10-CM

## 2014-04-14 LAB — CBC WITH DIFFERENTIAL/PLATELET
Basophils Absolute: 0.1 10*3/uL (ref 0.0–0.1)
Basophils Relative: 1 % (ref 0–1)
EOS PCT: 4 % (ref 0–5)
Eosinophils Absolute: 0.2 10*3/uL (ref 0.0–0.7)
HEMATOCRIT: 36.3 % (ref 36.0–46.0)
Hemoglobin: 12.4 g/dL (ref 12.0–15.0)
LYMPHS ABS: 1.3 10*3/uL (ref 0.7–4.0)
Lymphocytes Relative: 23 % (ref 12–46)
MCH: 31.9 pg (ref 26.0–34.0)
MCHC: 34.2 g/dL (ref 30.0–36.0)
MCV: 93.3 fL (ref 78.0–100.0)
Monocytes Absolute: 0.4 10*3/uL (ref 0.1–1.0)
Monocytes Relative: 7 % (ref 3–12)
NEUTROS ABS: 3.8 10*3/uL (ref 1.7–7.7)
Neutrophils Relative %: 65 % (ref 43–77)
Platelets: 312 10*3/uL (ref 150–400)
RBC: 3.89 MIL/uL (ref 3.87–5.11)
RDW: 13.6 % (ref 11.5–15.5)
WBC: 5.8 10*3/uL (ref 4.0–10.5)

## 2014-04-14 LAB — THYROID PANEL WITH TSH
FREE THYROXINE INDEX: 1.8 (ref 1.4–3.8)
T3 Uptake: 27 % (ref 22.0–35.0)
T4, Total: 6.5 ug/dL (ref 4.5–12.0)
TSH: 3.78 u[IU]/mL (ref 0.350–4.500)

## 2014-04-14 LAB — IFOBT (OCCULT BLOOD): IFOBT: NEGATIVE

## 2014-04-14 LAB — BASIC METABOLIC PANEL
BUN: 13 mg/dL (ref 6–23)
CALCIUM: 9.2 mg/dL (ref 8.4–10.5)
CO2: 26 mEq/L (ref 19–32)
Chloride: 98 mEq/L (ref 96–112)
Creat: 1.05 mg/dL (ref 0.50–1.10)
Glucose, Bld: 79 mg/dL (ref 70–99)
POTASSIUM: 4.2 meq/L (ref 3.5–5.3)
SODIUM: 133 meq/L — AB (ref 135–145)

## 2014-04-14 MED ORDER — CITALOPRAM HYDROBROMIDE 40 MG PO TABS: ORAL_TABLET | ORAL | Status: DC

## 2014-04-14 MED ORDER — ZOLPIDEM TARTRATE 5 MG PO TABS: ORAL_TABLET | ORAL | Status: DC

## 2014-04-14 MED ORDER — CLORAZEPATE DIPOTASSIUM 7.5 MG PO TABS: ORAL_TABLET | ORAL | Status: DC

## 2014-04-14 NOTE — Progress Notes (Signed)
Subjective:    Patient ID: Allison Chavez, female    DOB: [birth year], 73 y.o.   MRN: [id number]  HPI  This 73 y.o. Cauc female is here for [facility] Subsequent Annual CPE. She has no complaints and is compliant with medications. She declines mammogram but other HCM is current.    Patient Active Problem List   Diagnosis Date Noted  . Insomnia 10/08/2013  . HTN (hypertension) 03/07/2012  . Depression with anxiety 03/07/2012  . Renal insufficiency, mild     Prior to Admission medications   Medication Sig Start Date End Date Taking? Authorizing Provider  cetirizine (ZYRTEC) 10 MG tablet Take 1 tablet (10 mg total) by mouth daily. 08/31/13  Yes Orma Flaming, MD  citalopram (CELEXA) 40 MG tablet Take 1 1/2 tablets daily or as directed. 04/14/14  Yes Barton Fanny, MD  clorazepate (TRANXENE) 7.5 MG tablet TAKE (1) TABLET DAILY AS NEEDED. 04/14/14  Yes Barton Fanny, MD  desonide (DESOWEN) 0.05 % cream Apply topically 2 (two) times daily. 03/25/13  Yes Thao P Le, DO  losartan-hydrochlorothiazide (HYZAAR) 50-12.5 MG per tablet Take 1 tablet by mouth daily. 10/07/13  Yes Barton Fanny, MD  triamcinolone cream (KENALOG) 0.1 % Apply 1 application topically 2 (two) times daily.   Yes Historical Provider, MD  zolpidem (AMBIEN) 5 MG tablet TAKE 1 TABLET AT BEDTIME IF NEEDED FOR SLEEP. 04/14/14  Yes Barton Fanny, MD    History   Social History  . Marital Status: Married    Spouse Name: N/A    Number of Children: N/A  . Years of Education: N/A   Occupational History  . 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 Smoking status: Former Smoker -- 0.30 packs/day for 45 years    Types: Cigarettes    Quit date: 11/29/2001  . Smokeless tobacco: Not on file  . Alcohol Use: Yes     Comment: 2-5  . Drug Use: No  . Sexual Activity: Yes    Partners: Male    Birth Control/ Protection: Post-menopausal   Other Topics Concern  . Not on file   Social  History Narrative   Has not lived with husband (3rd marriage) for many years, though they maintain an intermittently sexual relationship. They talk almost daily.  He lives with his female partner. Education: College/Other. Exercise: Walks 5 days a week for 1 hour.    Family History  Problem Relation Age of Onset  . Cancer Father     Brain  . Diverticulitis Father   . Diabetes Sister   . Cancer Sister     colon  . Cancer Maternal Grandfather   . Stroke Paternal Grandmother   . Heart disease Paternal Grandfather      Review of Systems  Psychiatric/Behavioral: Positive for sleep disturbance.  All other systems reviewed and are negative.      Objective:   Physical Exam  Nursing note and vitals reviewed. Constitutional: She is oriented to person, place, and time. Vital signs are normal. She appears well-developed and well-nourished. No distress.  HENT:  Head: Normocephalic and atraumatic.  Right Ear: Hearing, tympanic membrane, external ear and ear canal normal.  Left Ear: Hearing, tympanic membrane, external ear and ear canal normal.  Nose: Nose normal. No nasal deformity or septal deviation.  Mouth/Throat: Uvula is midline, oropharynx is clear and moist and mucous membranes are normal. No oral lesions. No uvula swelling or dental  caries.  Eyes: Conjunctivae, EOM and lids are normal. Pupils are equal, round, and reactive to light. No scleral icterus.  Fundoscopic exam not done; pt wears corrective lenses.  Neck: Trachea normal, normal range of motion, full passive range of motion without pain and phonation normal. Neck supple. No JVD present. No spinous process tenderness and no muscular tenderness present. Carotid bruit is not present. No mass and no thyromegaly present.  Cardiovascular: Normal rate, regular rhythm, S1 normal, S2 normal, normal heart sounds, intact distal pulses and normal pulses.   No extrasystoles are present. PMI is not displaced.  Exam reveals no gallop and no  friction rub.   No murmur heard. Pulmonary/Chest: Effort normal and breath sounds normal. No respiratory distress. She has no decreased breath sounds. She has no wheezes. She has no rhonchi. She has no rales. Right breast exhibits no inverted nipple, no mass, no nipple discharge, no skin change and no tenderness. Left breast exhibits no inverted nipple, no mass, no nipple discharge, no skin change and no tenderness. Breasts are symmetrical.  Abdominal: Soft. Normal appearance, normal aorta and bowel sounds are normal. She exhibits no distension, no pulsatile midline mass and no mass. There is no hepatosplenomegaly. There is no tenderness. There is no guarding and no CVA tenderness.  Genitourinary:  Deferred.  Musculoskeletal:       Cervical back: Normal.       Thoracic back: Normal.       Lumbar back: Normal.  Major joints w/ mild degenerative changes. No muscle atrophy or deformities.  Lymphadenopathy:       Head (right side): No submental, no submandibular, no tonsillar, no preauricular, no posterior auricular and no occipital adenopathy present.       Head (left side): No submental, no submandibular, no tonsillar, no preauricular, no posterior auricular and no occipital adenopathy present.    She has no cervical adenopathy.    She has no axillary adenopathy.       Right: No inguinal and no supraclavicular adenopathy present.       Left: No inguinal and no supraclavicular adenopathy present.  Neurological: She is alert and oriented to person, place, and time. She has normal strength and normal reflexes. She displays no atrophy and no tremor. No cranial nerve deficit or sensory deficit. She exhibits normal muscle tone. She displays a negative Romberg sign. Coordination and gait normal. She displays no Babinski's sign on the right side. She displays no Babinski's sign on the left side.  Skin: Skin is warm, dry and intact. No ecchymosis, no lesion and no rash noted. She is not diaphoretic. No  cyanosis or erythema. No pallor. Nails show no clubbing.  Psychiatric: She has a normal mood and affect. Her speech is normal and behavior is normal. Judgment and thought content normal. Cognition and memory are normal.       Assessment & Plan:  Routine general medical examination at a health care facility- Pt declines to have mammogram scheduled.  Essential hypertension - Stable on Losartan- HCTZ 50-12.5 mg  1 tab daily. Plan: Basic metabolic panel, CBC with Differential, Thyroid Panel With TSH  Insomnia - Refill Zolpidem for prn use.   Plan: Thyroid Panel With TSH  Issue of repeat prescriptions   Meds ordered this encounter  Medications  . citalopram (CELEXA) 40 MG tablet    Sig: Take 1 1/2 tablets daily or as directed.    Dispense:  135 tablet    Refill:  3  . clorazepate (TRANXENE) 7.5  MG tablet    Sig: TAKE (1) TABLET DAILY AS NEEDED.    Dispense:  30 tablet    Refill:  2  . zolpidem (AMBIEN) 5 MG tablet    Sig: TAKE 1 TABLET AT BEDTIME IF NEEDED FOR SLEEP.    Dispense:  30 tablet    Refill:  2

## 2014-04-14 NOTE — Patient Instructions (Addendum)
Keeping You Healthy  Get These Tests  Blood Pressure- Have your blood pressure checked by your healthcare provider at least once a year.  Normal blood pressure is 120/80.  Weight- Have your body mass index (BMI) calculated to screen for obesity.  BMI is a measure of body fat based on height and weight.  You can calculate your own BMI at www.nhlbisupport.com/bmi/  Cholesterol- Have your cholesterol checked every year.  Diabetes- Have your blood sugar checked every year if you have high blood pressure, high cholesterol, a family history of diabetes or if you are overweight.  Pap Smear- Have a pap smear every 1 to 3 years if you have been sexually active.  If you are older than 65 and recent pap smears have been normal you may not need additional pap smears.  In addition, if you have had a hysterectomy  For benign disease additional pap smears are not necessary.  Mammogram-Yearly mammograms are essential for early detection of breast cancer  Screening for Colon Cancer- Colonoscopy starting at age 50. Screening may begin sooner depending on your family history and other health conditions.  Follow up colonoscopy as directed by your Gastroenterologist.  Screening for Osteoporosis- Screening begins at age 65 with bone density scanning, sooner if you are at higher risk for developing Osteoporosis.  Get these medicines  Calcium with Vitamin D- Your body requires 1200-1500 mg of Calcium a day and 800-1000 IU of Vitamin D a day.  You can only absorb 500 mg of Calcium at a time therefore Calcium must be taken in 2 or 3 separate doses throughout the day.  Hormones- Hormone therapy has been associated with increased risk for certain cancers and heart disease.  Talk to your healthcare provider about if you need relief from menopausal symptoms.  Aspirin- Ask your healthcare provider about taking Aspirin to prevent Heart Disease and Stroke.  Get these Immuniztions  Flu shot- Every fall  Pneumonia  shot- Once after the age of 65; if you are younger ask your healthcare provider if you need a pneumonia shot.  Tetanus- Every ten years.  Zostavax- Once after the age of 60 to prevent shingles.  Take these steps  Don't smoke- Your healthcare provider can help you quit. For tips on how to quit, ask your healthcare provider or go to www.smokefree.gov or call 1-800 QUIT-NOW.  Be physically active- Exercise 5 days a week for a minimum of 30 minutes.  If you are not already physically active, start slow and gradually work up to 30 minutes of moderate physical activity.  Try walking, dancing, bike riding, swimming, etc.  Eat a healthy diet- Eat a variety of healthy foods such as fruits, vegetables, whole grains, low fat milk, low fat cheeses, yogurt, lean meats, chicken, fish, eggs, dried beans, tofu, etc.  For more information go to www.thenutritionsource.org  Dental visit- Brush and floss teeth twice daily; visit your dentist twice a year.  Eye exam- Visit your Optometrist or Ophthalmologist yearly.  Drink alcohol in moderation- Limit alcohol intake to one drink or less a day.  Never drink and drive.  Depression- Your emotional health is as important as your physical health.  If you're feeling down or losing interest in things you normally enjoy, please talk to your healthcare provider.  Seat Belts- can save your life; always wear one  Smoke/Carbon Monoxide detectors- These detectors need to be installed on the appropriate level of your home.  Replace batteries at least once a year.  Violence- If anyone   is threatening or hurting you, please tell your healthcare provider.  Living Will/ Health care power of attorney- Discuss with your healthcare provider and family.        Mediterranean Diet  Why follow it? Research shows.   Those who follow the Mediterranean diet have a reduced risk of heart disease    The diet is associated with a reduced incidence of Parkinson's and Alzheimer's  diseases   People following the diet may have longer life expectancies and lower rates of chronic diseases    The Dietary Guidelines for Americans recommends the Mediterranean diet as an eating plan to promote health and prevent disease  What Is the Mediterranean Diet?    Healthy eating plan based on typical foods and recipes of Mediterranean-style cooking   The diet is primarily a plant based diet; these foods should make up a majority of meals   Starches - Plant based foods should make up a majority of meals - They are an important sources of vitamins, minerals, energy, antioxidants, and fiber - Choose whole grains, foods high in fiber and minimally processed items  - Typical grain sources include wheat, oats, barley, corn, brown rice, bulgar, farro, millet, polenta, couscous  - Various types of beans include chickpeas, lentils, fava beans, black beans, white beans   Fruits  Veggies - Large quantities of antioxidant rich fruits & veggies; 6 or more servings  - Vegetables can be eaten raw or lightly drizzled with oil and cooked  - Vegetables common to the traditional Mediterranean Diet include: artichokes, arugula, beets, broccoli, brussel sprouts, cabbage, carrots, celery, collard greens, cucumbers, eggplant, kale, leeks, lemons, lettuce, mushrooms, okra, onions, peas, peppers, potatoes, pumpkin, radishes, rutabaga, shallots, spinach, sweet potatoes, turnips, zucchini - Fruits common to the Mediterranean Diet include: apples, apricots, avocados, cherries, clementines, dates, figs, grapefruits, grapes, melons, nectarines, oranges, peaches, pears, pomegranates, strawberries, tangerines  Fats - Replace butter and margarine with healthy oils, such as olive oil, canola oil, and tahini  - Limit nuts to no more than a handful a day  - Nuts include walnuts, almonds, pecans, pistachios, pine nuts  - Limit or avoid candied, honey roasted or heavily salted nuts - Olives are central to the [employer] - can be eaten whole or used in a variety of dishes   Meats Protein - Limiting red meat: no more than a few times a month - When eating red meat: choose lean cuts and keep the portion to the size of deck of cards - Eggs: approx. 0 to 4 times a week  - Fish and lean poultry: at least 2 a week  - Healthy protein sources include, chicken, [place], lean beef, lamb - Increase intake of seafood such as tuna, salmon, trout, mackerel, shrimp, scallops - Avoid or limit high fat processed meats such as sausage and bacon  Dairy - Include moderate amounts of low fat dairy products  - Focus on healthy dairy such as fat free yogurt, skim milk, low or reduced fat cheese - Limit dairy products higher in fat such as whole or 2% milk, cheese, ice cream  Alcohol - Moderate amounts of red wine is ok  - No more than 5 oz daily for women (all ages) and men older than age 62  - No more than 10 oz of wine daily for men younger than 30  Other - Limit sweets and other desserts  - Use herbs and spices instead of salt to flavor foods  - Herbs and spices common  to the traditional Mediterranean Diet include: basil, bay leaves, chives, cloves, cumin, fennel, garlic, lavender, marjoram, mint, oregano, parsley, pepper, rosemary, sage, savory, sumac, tarragon, thyme   It's not just a diet, it's a lifestyle:    The Mediterranean diet includes lifestyle factors typical of those in the region    Foods, drinks and meals are best eaten with others and savored   Daily physical activity is important for overall good health   This could be strenuous exercise like running and aerobics   This could also be more leisurely activities such as walking, housework, yard-work, or taking the stairs   Moderation is the key; a balanced and healthy diet accommodates most foods and drinks   Consider portion sizes and frequency of consumption of certain foods   Meal Ideas & Options:    Breakfast:  o Whole wheat toast or whole wheat English  muffins with peanut butter & hard boiled egg o Steel cut oats topped with apples & cinnamon and skim milk  o Fresh fruit: banana, strawberries, melon, berries, peaches  o Smoothies: strawberries, bananas, greek yogurt, peanut butter o Low fat greek yogurt with blueberries and granola  o Egg white omelet with spinach and mushrooms o Breakfast couscous: whole wheat couscous, apricots, skim milk, cranberries    Sandwiches:  o Hummus and grilled vegetables (peppers, zucchini, squash) on whole wheat bread   o Grilled chicken on whole wheat pita with lettuce, tomatoes, cucumbers or tzatziki  o Tuna salad on whole wheat bread: tuna salad made with greek yogurt, olives, red peppers, capers, green onions o Garlic rosemary lamb pita: lamb sauted with garlic, rosemary, salt & pepper; add lettuce, cucumber, greek yogurt to pita - flavor with lemon juice and black pepper    Seafood:  o Mediterranean grilled salmon, seasoned with garlic, basil, parsley, lemon juice and black pepper o Shrimp, lemon, and spinach whole-grain pasta salad made with low fat greek yogurt  o Seared scallops with lemon orzo  o Seared tuna steaks seasoned salt, pepper, coriander topped with tomato mixture of olives, tomatoes, olive oil, minced garlic, parsley, green onions and cappers    Meats:  o Herbed greek chicken salad with kalamata olives, cucumber, feta  o Red bell peppers stuffed with spinach, bulgur, lean ground beef (or lentils) & topped with feta   o Kebabs: skewers of chicken, tomatoes, onions, zucchini, squash  o [place] burgers: made with red onions, mint, dill, lemon juice, feta cheese topped with roasted red peppers   Vegetarian o Cucumber salad: cucumbers, artichoke hearts, celery, red onion, feta cheese, tossed in olive oil & lemon juice  o Hummus and whole grain pita points with a greek salad (lettuce, tomato, feta, olives, cucumbers, red onion) o Lentil soup with celery, carrots made with vegetable broth,  garlic, salt and pepper  o Tabouli salad: parsley, bulgur, mint, scallions, cucumbers, tomato, radishes, lemon juice, olive oil, salt and pepper.

## 2014-04-14 NOTE — Addendum Note (Signed)
Addended by: Constance Goltz on: 04/14/2014 05:01 PM   Modules accepted: Orders

## 2014-04-16 NOTE — Progress Notes (Signed)
Quick Note:  Please advise pt regarding following labs... Your lab results are normal. Thyroid function results are in the normal range. Sodium is a little below normal. This could be related to medications that you are taking. You can add a little salt to your diet to help normalize this value. Kidney function is normal.  Stool test for blood is negative.  Contact the clinic if you have any questions or concerns.  Copy to pt. ______

## 2014-09-09 IMAGING — US US ABDOMEN LIMITED
1 series · 14 of 25 positions shown · non-contrast
Comparison: None.

CLINICAL DATA: Right upper quadrant pain

LIMITED ABDOMINAL ULTRASOUND - RIGHT UPPER QUADRANT

[Series 1: us abdomen limited · 0.22mm/px · 14 of 40 slices shown]
[im 1/40]
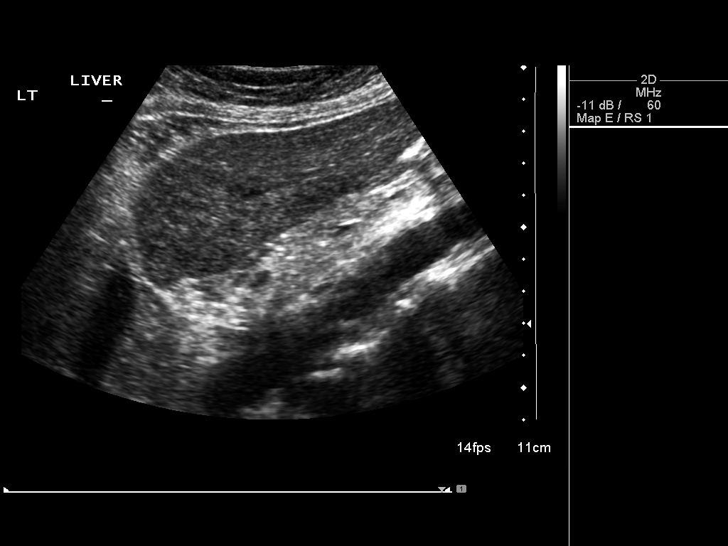
[im 4/40]
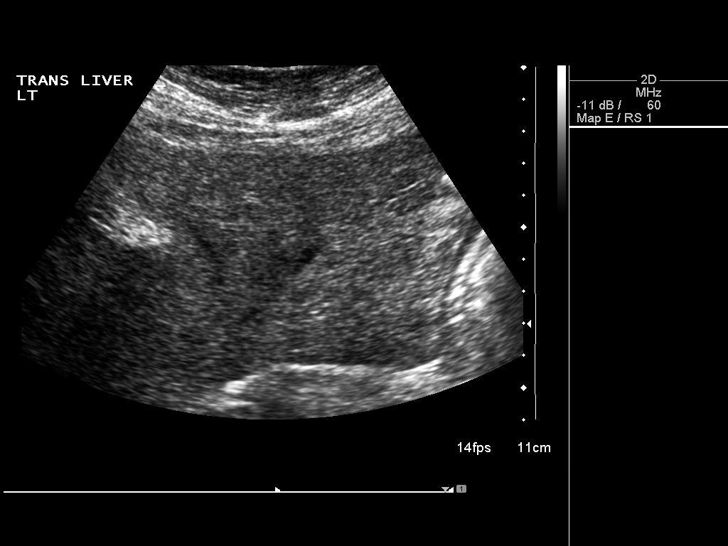
[im 7/40]
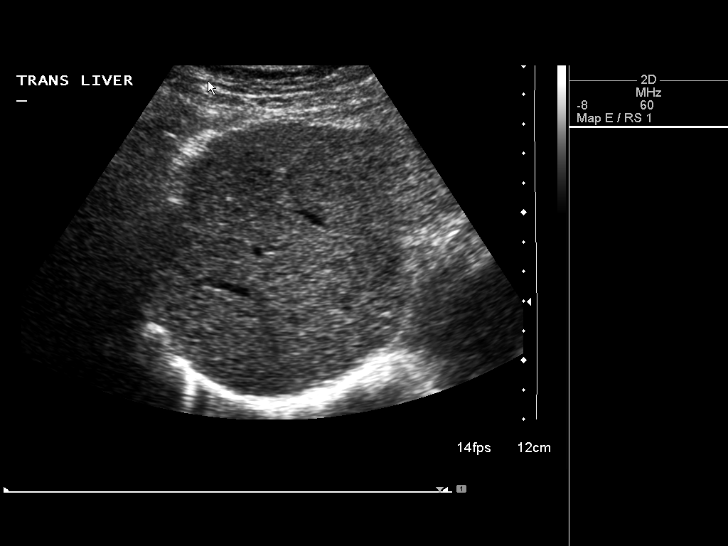
[im 10/40]
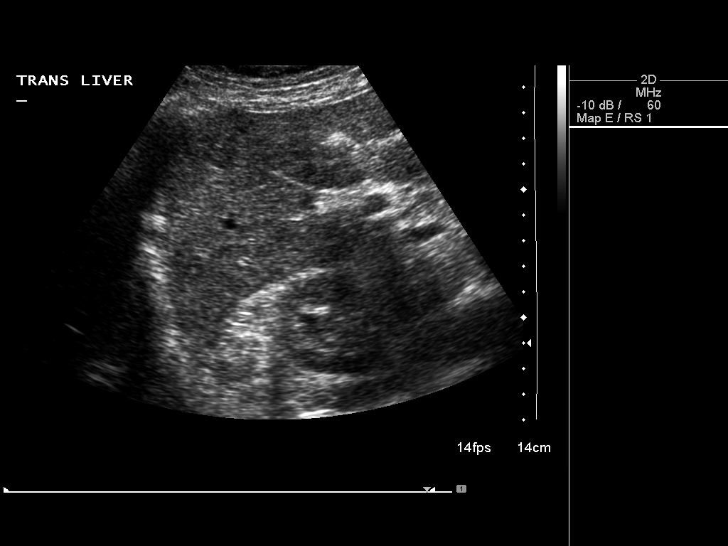
[im 14/40]
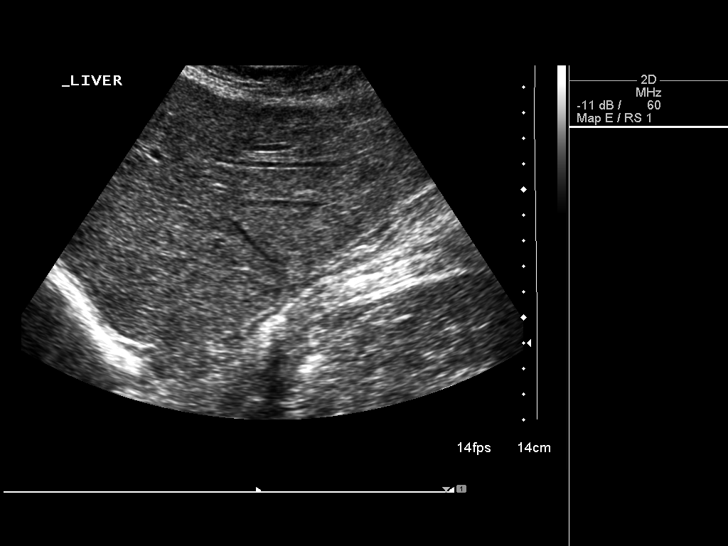
[im 15/40]
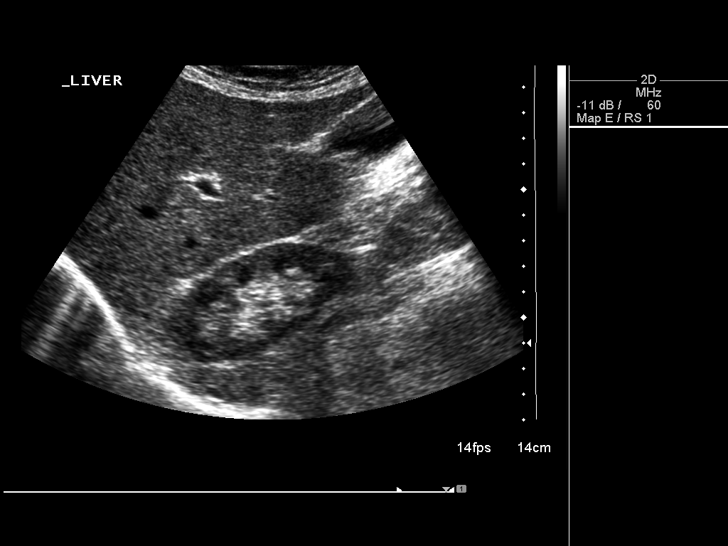
[im 18/40]
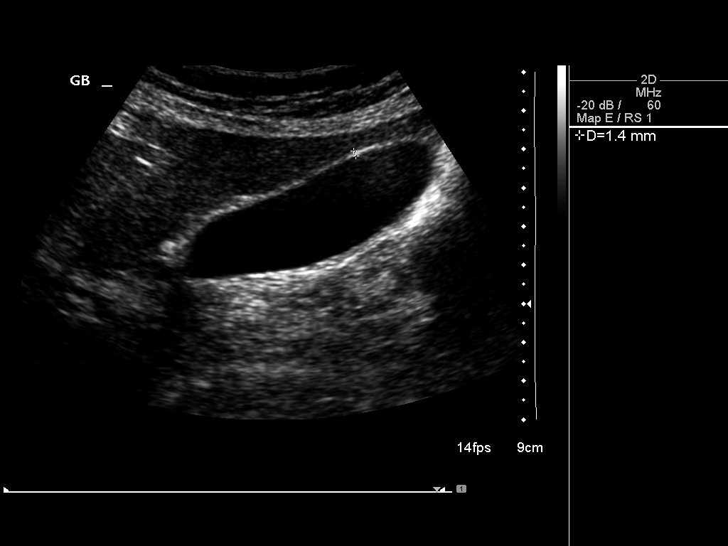
[im 22/40]
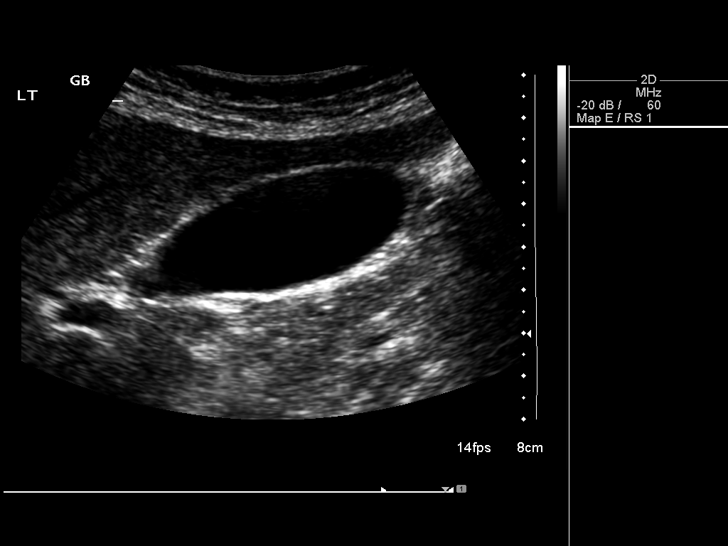
[im 25/40]
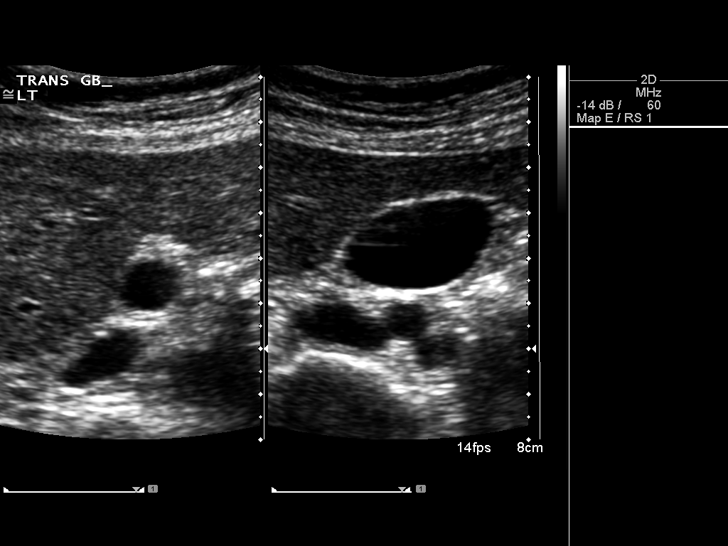
[im 27/40]
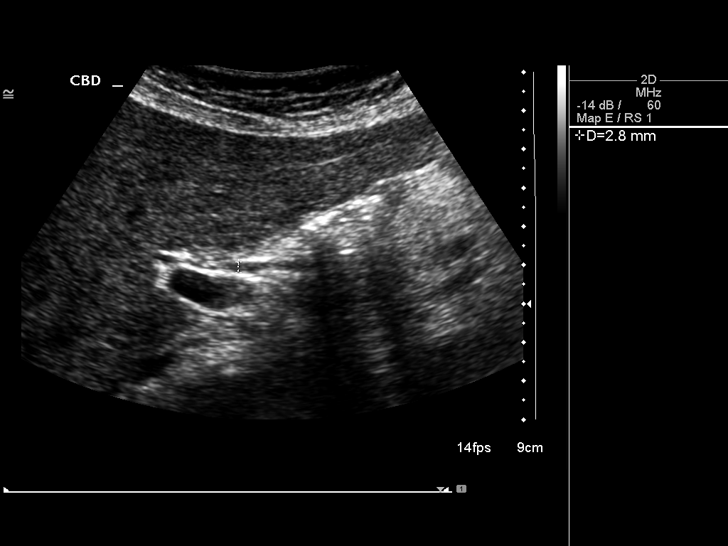
[im 30/40]
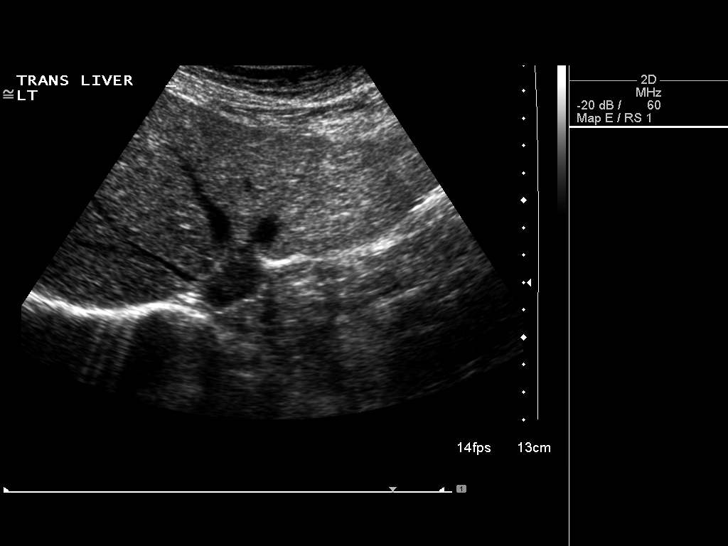
[im 33/40]
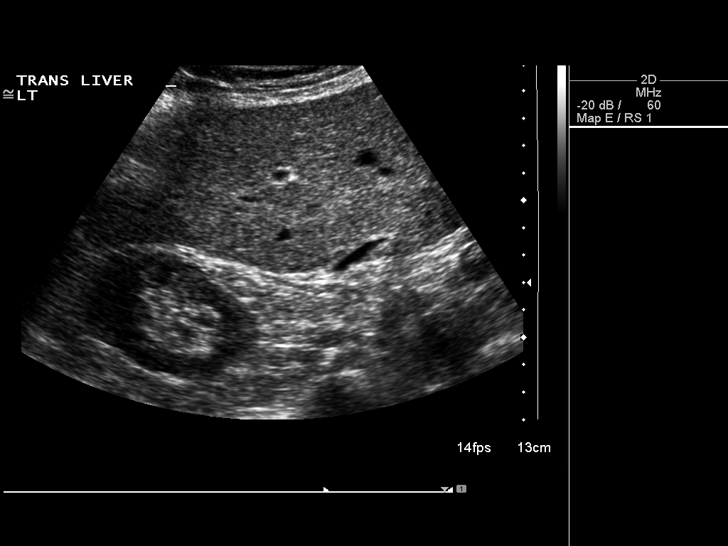
[im 36/40]
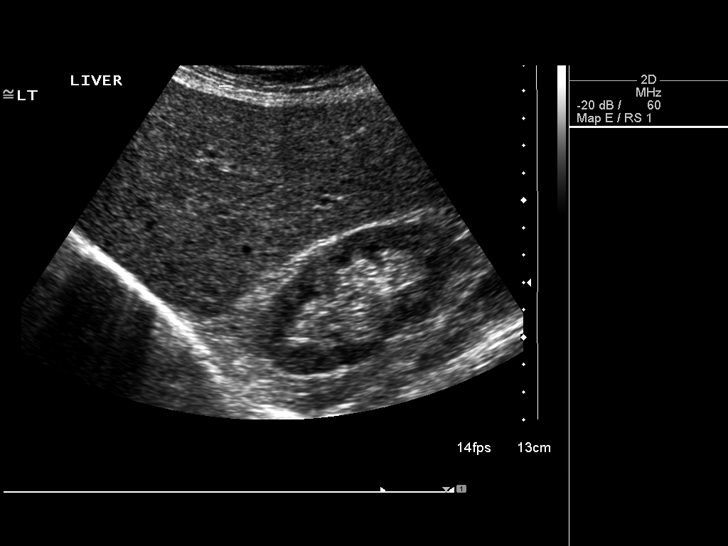
[im 40/40]
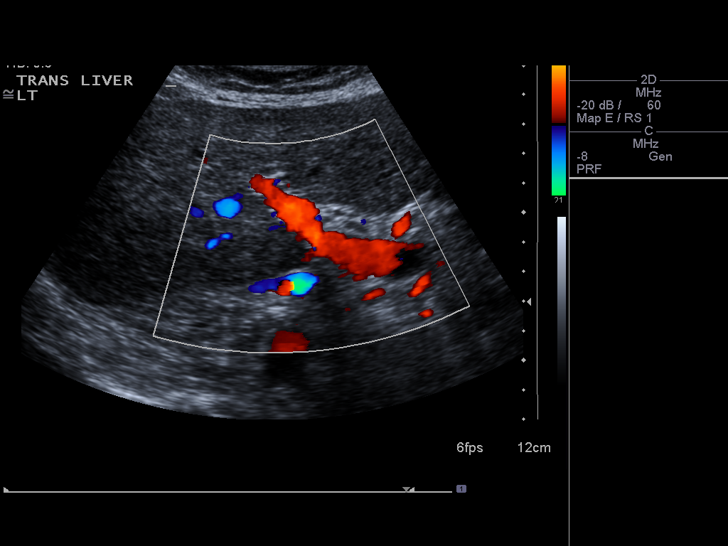

[14 of 25 positions shown; findings below may reference images not displayed]

FINDINGS: Gallbladder:  The gallbladder is visualized and no gallstones are
noted.  There is no pain over the gallbladder with compression.

Common bile duct:  The common bile duct is normal measuring 2.8 mm
in diameter.

Liver:  The liver has a normal echogenic pattern.  No ductal
dilatation is seen.
IMPRESSION: Negative.  No gallstones.

## 2014-09-22 ENCOUNTER — Emergency Department (HOSPITAL_COMMUNITY): Payer: Medicare Other

## 2014-09-22 ENCOUNTER — Observation Stay (HOSPITAL_COMMUNITY)
Admission: EM | Admit: 2014-09-22 | Discharge: 2014-09-24 | Disposition: A | Discharge status: Home / Self Care | Payer: Medicare Other | Attending: Orthopedic Surgery | Admitting: Orthopedic Surgery

## 2014-09-22 ENCOUNTER — Observation Stay (HOSPITAL_COMMUNITY): Payer: Medicare Other

## 2014-09-22 ENCOUNTER — Encounter (HOSPITAL_COMMUNITY): Payer: Self-pay | Admitting: *Deleted

## 2014-09-22 DIAGNOSIS — M25522 Pain in left elbow: Secondary | ICD-10-CM | POA: Diagnosis not present

## 2014-09-22 DIAGNOSIS — S42202A Unspecified fracture of upper end of left humerus, initial encounter for closed fracture: Secondary | ICD-10-CM | POA: Diagnosis not present

## 2014-09-22 DIAGNOSIS — Z882 Allergy status to sulfonamides status: Secondary | ICD-10-CM | POA: Insufficient documentation

## 2014-09-22 DIAGNOSIS — M25519 Pain in unspecified shoulder: Secondary | ICD-10-CM | POA: Diagnosis not present

## 2014-09-22 DIAGNOSIS — F419 Anxiety disorder, unspecified: Secondary | ICD-10-CM | POA: Diagnosis not present

## 2014-09-22 DIAGNOSIS — Z888 Allergy status to other drugs, medicaments and biological substances status: Secondary | ICD-10-CM | POA: Insufficient documentation

## 2014-09-22 DIAGNOSIS — S59902A Unspecified injury of left elbow, initial encounter: Secondary | ICD-10-CM | POA: Diagnosis not present

## 2014-09-22 DIAGNOSIS — S42222A 2-part displaced fracture of surgical neck of left humerus, initial encounter for closed fracture: Secondary | ICD-10-CM | POA: Diagnosis not present

## 2014-09-22 DIAGNOSIS — R936 Abnormal findings on diagnostic imaging of limbs: Secondary | ICD-10-CM | POA: Diagnosis not present

## 2014-09-22 DIAGNOSIS — Z8739 Personal history of other diseases of the musculoskeletal system and connective tissue: Secondary | ICD-10-CM | POA: Diagnosis present

## 2014-09-22 DIAGNOSIS — S42292A Other displaced fracture of upper end of left humerus, initial encounter for closed fracture: Secondary | ICD-10-CM | POA: Diagnosis not present

## 2014-09-22 DIAGNOSIS — S43015A Anterior dislocation of left humerus, initial encounter: Secondary | ICD-10-CM | POA: Diagnosis not present

## 2014-09-22 DIAGNOSIS — W1830XA Fall on same level, unspecified, initial encounter: Secondary | ICD-10-CM | POA: Diagnosis not present

## 2014-09-22 DIAGNOSIS — S5002XA Contusion of left elbow, initial encounter: Secondary | ICD-10-CM | POA: Diagnosis not present

## 2014-09-22 DIAGNOSIS — Z9889 Other specified postprocedural states: Secondary | ICD-10-CM

## 2014-09-22 DIAGNOSIS — M479 Spondylosis, unspecified: Secondary | ICD-10-CM | POA: Diagnosis not present

## 2014-09-22 DIAGNOSIS — IMO0001 Reserved for inherently not codable concepts without codable children: Secondary | ICD-10-CM

## 2014-09-22 DIAGNOSIS — S42252A Displaced fracture of greater tuberosity of left humerus, initial encounter for closed fracture: Principal | ICD-10-CM | POA: Insufficient documentation

## 2014-09-22 DIAGNOSIS — Z87891 Personal history of nicotine dependence: Secondary | ICD-10-CM | POA: Insufficient documentation

## 2014-09-22 DIAGNOSIS — Y9259 Other trade areas as the place of occurrence of the external cause: Secondary | ICD-10-CM | POA: Diagnosis not present

## 2014-09-22 DIAGNOSIS — R52 Pain, unspecified: Secondary | ICD-10-CM

## 2014-09-22 DIAGNOSIS — H269 Unspecified cataract: Secondary | ICD-10-CM | POA: Diagnosis not present

## 2014-09-22 DIAGNOSIS — Z419 Encounter for procedure for purposes other than remedying health state, unspecified: Secondary | ICD-10-CM

## 2014-09-22 DIAGNOSIS — S42302A Unspecified fracture of shaft of humerus, left arm, initial encounter for closed fracture: Secondary | ICD-10-CM | POA: Diagnosis not present

## 2014-09-22 DIAGNOSIS — I10 Essential (primary) hypertension: Secondary | ICD-10-CM | POA: Diagnosis not present

## 2014-09-22 DIAGNOSIS — S43005A Unspecified dislocation of left shoulder joint, initial encounter: Secondary | ICD-10-CM | POA: Diagnosis present

## 2014-09-22 LAB — CBC
HEMATOCRIT: 33.6 % — AB (ref 36.0–46.0)
Hemoglobin: 11.5 g/dL — ABNORMAL LOW (ref 12.0–15.0)
MCH: 32.8 pg (ref 26.0–34.0)
MCHC: 34.2 g/dL (ref 30.0–36.0)
MCV: 95.7 fL (ref 78.0–100.0)
Platelets: 245 10*3/uL (ref 150–400)
RBC: 3.51 MIL/uL — AB (ref 3.87–5.11)
RDW: 13.2 % (ref 11.5–15.5)
WBC: 11.5 10*3/uL — ABNORMAL HIGH (ref 4.0–10.5)

## 2014-09-22 LAB — BASIC METABOLIC PANEL
ANION GAP: 9 (ref 5–15)
BUN: 22 mg/dL (ref 6–23)
CALCIUM: 8.2 mg/dL — AB (ref 8.4–10.5)
CO2: 21 mmol/L (ref 19–32)
CREATININE: 1.01 mg/dL (ref 0.50–1.10)
Chloride: 105 mmol/L (ref 96–112)
GFR, EST AFRICAN AMERICAN: 63 mL/min — AB (ref >90)
GFR, EST NON AFRICAN AMERICAN: 55 mL/min — AB (ref >90)
GLUCOSE: 103 mg/dL — AB (ref 70–99)
Potassium: 4.5 mmol/L (ref 3.5–5.1)
Sodium: 135 mmol/L (ref 135–145)

## 2014-09-22 MED ORDER — CITALOPRAM HYDROBROMIDE 40 MG PO TABS
60.0000 mg | ORAL_TABLET | Freq: Every day | ORAL | Status: DC
  Administered 2014-09-22 – 2014-09-24 (×2): 60 mg via ORAL
  Filled 2014-09-22 (×6): qty 1

## 2014-09-22 MED ORDER — HYDROMORPHONE HCL 1 MG/ML IJ SOLN
0.5000 mg | INTRAMUSCULAR | Status: DC | PRN
  Administered 2014-09-23: 0.5 mg via INTRAVENOUS
  Filled 2014-09-22: qty 1

## 2014-09-22 MED ORDER — ACETAMINOPHEN 650 MG RE SUPP: 650.0000 mg | Freq: Four times a day (QID) | RECTAL | Status: DC | PRN

## 2014-09-22 MED ORDER — ALUM & MAG HYDROXIDE-SIMETH 200-200-20 MG/5ML PO SUSP: 30.0000 mL | Freq: Four times a day (QID) | ORAL | Status: DC | PRN

## 2014-09-22 MED ORDER — ACETAMINOPHEN 325 MG PO TABS: 650.0000 mg | ORAL_TABLET | Freq: Four times a day (QID) | ORAL | Status: DC | PRN

## 2014-09-22 MED ORDER — MORPHINE SULFATE 4 MG/ML IJ SOLN
4.0000 mg | Freq: Once | INTRAMUSCULAR | Status: AC
  Administered 2014-09-22: 4 mg via INTRAVENOUS
  Filled 2014-09-22: qty 1

## 2014-09-22 MED ORDER — NALOXONE HCL 1 MG/ML IJ SOLN
2.0000 mg | Freq: Once | INTRAMUSCULAR | Status: DC
  Filled 2014-09-22: qty 2

## 2014-09-22 MED ORDER — PROPOFOL 10 MG/ML IV BOLUS
20.0000 mg | Freq: Once | INTRAVENOUS | Status: AC
  Administered 2014-09-22: 20 mg via INTRAVENOUS

## 2014-09-22 MED ORDER — HYDROMORPHONE HCL 1 MG/ML IJ SOLN
1.0000 mg | Freq: Once | INTRAMUSCULAR | Status: AC
  Administered 2014-09-22: 1 mg via INTRAVENOUS
  Filled 2014-09-22: qty 1

## 2014-09-22 MED ORDER — LOSARTAN POTASSIUM-HCTZ 50-12.5 MG PO TABS: 1.0000 | ORAL_TABLET | Freq: Every day | ORAL | Status: DC

## 2014-09-22 MED ORDER — HYDROCODONE-ACETAMINOPHEN 5-325 MG PO TABS
1.0000 | ORAL_TABLET | Freq: Once | ORAL | Status: AC
  Administered 2014-09-22: 1 via ORAL
  Filled 2014-09-22: qty 1

## 2014-09-22 MED ORDER — ZOLPIDEM TARTRATE 5 MG PO TABS: 5.0000 mg | ORAL_TABLET | Freq: Every evening | ORAL | Status: DC | PRN

## 2014-09-22 MED ORDER — NALOXONE HCL 0.4 MG/ML IJ SOLN: 0.4000 mg | Freq: Once | INTRAMUSCULAR | Status: DC

## 2014-09-22 MED ORDER — PROPOFOL 10 MG/ML IV BOLUS
0.5000 mg/kg | Freq: Once | INTRAVENOUS | Status: DC
  Filled 2014-09-22: qty 1

## 2014-09-22 MED ORDER — ONDANSETRON HCL 4 MG/2ML IJ SOLN
4.0000 mg | Freq: Four times a day (QID) | INTRAMUSCULAR | Status: DC | PRN
  Administered 2014-09-23: 4 mg via INTRAVENOUS
  Filled 2014-09-22: qty 2

## 2014-09-22 MED ORDER — ONDANSETRON HCL 4 MG PO TABS: 4.0000 mg | ORAL_TABLET | Freq: Four times a day (QID) | ORAL | Status: DC | PRN

## 2014-09-22 MED ORDER — PROPOFOL 10 MG/ML IV BOLUS
40.0000 mg | Freq: Once | INTRAVENOUS | Status: AC
Start: 2014-09-22 — End: 2014-09-22
  Administered 2014-09-22: 40 mg via INTRAVENOUS

## 2014-09-22 MED ORDER — HYDROCHLOROTHIAZIDE 12.5 MG PO CAPS
12.5000 mg | ORAL_CAPSULE | Freq: Every day | ORAL | Status: DC
  Administered 2014-09-24: 12.5 mg via ORAL
  Filled 2014-09-22 (×2): qty 1

## 2014-09-22 MED ORDER — METHOCARBAMOL 1000 MG/10ML IJ SOLN
500.0000 mg | Freq: Four times a day (QID) | INTRAVENOUS | Status: DC | PRN
  Administered 2014-09-22 – 2014-09-23 (×2): 500 mg via INTRAVENOUS
  Filled 2014-09-22 (×4): qty 5

## 2014-09-22 MED ORDER — METHOCARBAMOL 500 MG PO TABS: 500.0000 mg | ORAL_TABLET | Freq: Four times a day (QID) | ORAL | Status: DC | PRN

## 2014-09-22 MED ORDER — ETOMIDATE 2 MG/ML IV SOLN: 0.3000 mg/kg | Freq: Once | INTRAVENOUS | Status: DC

## 2014-09-22 MED ORDER — LOSARTAN POTASSIUM 50 MG PO TABS
50.0000 mg | ORAL_TABLET | Freq: Every day | ORAL | Status: DC
  Administered 2014-09-24: 50 mg via ORAL
  Filled 2014-09-22 (×2): qty 1

## 2014-09-22 MED ORDER — OXYCODONE HCL 5 MG PO TABS
5.0000 mg | ORAL_TABLET | ORAL | Status: DC | PRN
  Administered 2014-09-22 – 2014-09-23 (×2): 5 mg via ORAL
  Filled 2014-09-22 (×2): qty 1

## 2014-09-22 MED ORDER — MORPHINE SULFATE 10 MG/ML IJ SOLN
10.0000 mg | Freq: Once | INTRAMUSCULAR | Status: AC
  Administered 2014-09-22: 10 mg via INTRAVENOUS
  Filled 2014-09-22: qty 1

## 2014-09-22 MED ORDER — LORATADINE 10 MG PO TABS
10.0000 mg | ORAL_TABLET | Freq: Every day | ORAL | Status: DC
  Filled 2014-09-22 (×2): qty 1

## 2014-09-22 MED ORDER — ETOMIDATE 2 MG/ML IV SOLN
10.0000 mg | Freq: Once | INTRAVENOUS | Status: AC
  Administered 2014-09-22: 10 mg via INTRAVENOUS
  Filled 2014-09-22: qty 10

## 2014-09-22 MED ORDER — SODIUM CHLORIDE 0.9 % IV SOLN: INTRAVENOUS | Status: DC

## 2014-09-22 NOTE — ED Notes (Signed)
Jacubowitz, MD at bedside.  

## 2014-09-22 NOTE — ED Notes (Signed)
Sedation End

## 2014-09-22 NOTE — ED Notes (Signed)
Pt states, "I was thinking about a sweet person and my head was in the clouds a little bit and the curb was wet and I slipped."  Pt c/o left shoulder, left scapula, and left elbow pain.

## 2014-09-22 NOTE — ED Notes (Signed)
Sedation Start. (unable to chart 2nd sedation under sedation flow sheet Altha Harm, Charge RN aware)
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
## 2014-09-22 NOTE — ED Notes (Signed)
Blanchie Dessert, MD, Hart Robinsons, MD (orthopedics), and Ebony Hail, RN at bedside for procedure. Pt on monitor, O2, and IV fluids. Code cart at bedside in preparation for sedation and procedure.

## 2014-09-22 NOTE — ED Provider Notes (Signed)
CSN: [id number]     Arrival date & time 09/22/14  1142 History   First MD Initiated Contact with Patient 09/22/14 1200     Chief Complaint  Patient presents with  . Fall  . Shoulder Injury     (Consider location/radiation/quality/duration/timing/severity/associated sxs/prior Treatment) Patient is a 74 y.o. female presenting with fall and shoulder injury.  Fall  Shoulder Injury   Patient states she tripped on a curb when she was not watching where she walked at 10:20 AM today injuring her left shoulder and left elbow. Pain is worse with movement improved with remaining still no other injury. No treatment prior to coming here. Past Medical History  Diagnosis Date  . Hypertension   . Anxiety   . Depression   . DJD (degenerative joint disease) of cervical spine   . DJD of shoulder, right   . Renal insufficiency, mild   . Cataract    Past Surgical History  Procedure Laterality Date  . Tonsillectomy    . Eye surgery     Family History  Problem Relation Age of Onset  . Cancer Father     Brain  . Diverticulitis Father   . Diabetes Sister   . Cancer Sister     colon  . Cancer Maternal Grandfather   . Stroke Paternal Grandmother   . Heart disease Paternal Grandfather    History  Substance Use Topics  . Smoking status: Former Smoker -- 0.30 packs/day for 45 years    Types: Cigarettes    Quit date: 11/29/2001  . Smokeless tobacco: Not on file  . Alcohol Use: Yes     Comment: 2-5   OB History    No data available     Review of Systems  Constitutional: Negative.   HENT: Negative.   Respiratory: Negative.   Cardiovascular: Negative.   Gastrointestinal: Negative.   Musculoskeletal: Positive for arthralgias.       Left shoulder pain and left elbow pain  Skin: Negative.   Neurological: Negative.   Psychiatric/Behavioral: Negative.   All other systems reviewed and are negative.     Allergies  Ace inhibitors; Sulfa antibiotics; and Wellbutrin  Home Medications    Prior to Admission medications   Medication Sig Start Date End Date Taking? Authorizing Provider  cetirizine (ZYRTEC) 10 MG tablet Take 1 tablet (10 mg total) by mouth daily. 08/31/13   Orma Flaming, MD  citalopram (CELEXA) 40 MG tablet Take 1 1/2 tablets daily or as directed. 04/14/14   Barton Fanny, MD  clorazepate (TRANXENE) 7.5 MG tablet TAKE (1) TABLET DAILY AS NEEDED. 04/14/14   Barton Fanny, MD  desonide (DESOWEN) 0.05 % cream Apply topically 2 (two) times daily. 03/25/13   Thao P Le, DO  losartan-hydrochlorothiazide (HYZAAR) 50-12.5 MG per tablet Take 1 tablet by mouth daily. 10/07/13   Barton Fanny, MD  triamcinolone cream (KENALOG) 0.1 % Apply 1 application topically 2 (two) times daily.    Historical Provider, MD  zolpidem (AMBIEN) 5 MG tablet TAKE 1 TABLET AT BEDTIME IF NEEDED FOR SLEEP. 04/14/14   Barton Fanny, MD   BP 167/73 mmHg  Pulse 72  Temp(Src) 97.8 F (36.6 C) (Oral)  Resp 20  Ht '5\' 2"'  (1.575 m)  Wt 140 lb (63.504 kg)  BMI 25.60 kg/m2  SpO2 95% Physical Exam  Constitutional: She appears well-developed and well-nourished.  HENT:  Head: Normocephalic and atraumatic.  Eyes: Conjunctivae are normal. Pupils are equal, round, and reactive to light.  Neck: Neck supple. No tracheal deviation present. No thyromegaly present.  Cardiovascular: Normal rate and regular rhythm.   No murmur heard. Pulmonary/Chest: Effort normal and breath sounds normal.  Abdominal: Soft. Bowel sounds are normal. She exhibits no distension. There is no tenderness.  Musculoskeletal: Normal range of motion. She exhibits no edema or tenderness.  Entire spine nontender. Pelvis stable nontender. Left upper extremity skin intact. No deformity no swelling. Limited range of shoulder and elbow secondary to pain. Radial pulse 2+. Good capillary refill.  Neurological: She is alert. Coordination normal.  Skin: Skin is warm and dry. No rash noted.  Psychiatric: She has a normal mood  and affect.  Nursing note and vitals reviewed.   ED Course  Procedural sedation Date/Time: 09/22/2014 2:30 PM Performed by: Orlie Dakin Authorized by: Orlie Dakin Consent: Verbal consent obtained. Written consent obtained. Risks and benefits: risks, benefits and alternatives were discussed Consent given by: patient Patient understanding: patient states understanding of the procedure being performed Patient consent: the patient's understanding of the procedure matches consent given Procedure consent: procedure consent matches procedure scheduled Relevant documents: relevant documents present and verified Test results: test results available and properly labeled Site marked: the operative site was not marked Imaging studies: imaging studies available Required items: required blood products, implants, devices, and special equipment available Patient identity confirmed: verbally with patient, arm band, provided demographic data and hospital-assigned identification number Time out: Immediately prior to procedure a "time out" was called to verify the correct patient, procedure, equipment, support staff and site/side marked as required. Preparation: Patient was prepped and draped in the usual sterile fashion. Local anesthesia used: no Patient sedated: yes Sedatives: etomidate Analgesia: morphine Sedation start date/time: 09/22/2014 3:27 PM Sedation end date/time: 09/22/2014 3:05 PM Vitals: Vital signs were monitored during sedation. Patient tolerance: Patient tolerated the procedure well with no immediate complications  Reduction of dislocation Date/Time: 09/22/2014 2:30 PM Performed by: Orlie Dakin Authorized by: Orlie Dakin Consent: Verbal consent obtained. Written consent obtained. Risks and benefits: risks, benefits and alternatives were discussed Consent given by: patient Patient understanding: patient states understanding of the procedure being performed Patient  consent: the patient's understanding of the procedure matches consent given Procedure consent: procedure consent matches procedure scheduled Relevant documents: relevant documents present and verified Test results: test results available and properly labeled Site marked: the operative site was not marked Imaging studies: imaging studies available Required items: required blood products, implants, devices, and special equipment available Patient identity confirmed: verbally with patient, arm band, provided demographic data and hospital-assigned identification number Preparation: Patient was prepped and draped in the usual sterile fashion. Local anesthesia used: no Patient sedated: yes Sedation type: moderate (conscious) sedation Sedatives: etomidate Analgesia: morphine Sedation start date/time: 09/22/2014 2:56 PM Sedation end date/time: 09/22/2014 3:05 PM Comments: Unsuccessful closed reduction attempt   (including critical care time) Labs Review Labs Reviewed - No data to display  Imaging Review No results found.   EKG Interpretation None     X-rays viewed by me  Spoke Dr. Theda Sers , on [facility]. He will consult on case given failed closed reduction attempt of left shoulder  4 PM patient's pain is under control after treatment with intravenous opioids and placement in shoulder immobilizer.Pt signed out to Dr. Maryan Rued at 4 pm Results for orders placed or performed in visit on 34/28/76  Basic metabolic panel  Result Value Ref Range   Sodium 133 (L) 135 - 145 mEq/L   Potassium 4.2 3.5 - 5.3 mEq/L  Chloride 98 96 - 112 mEq/L   CO2 26 19 - 32 mEq/L   Glucose, Bld 79 70 - 99 mg/dL   BUN 13 6 - 23 mg/dL   Creat 1.05 0.50 - 1.10 mg/dL   Calcium 9.2 8.4 - 10.5 mg/dL  CBC with Differential  Result Value Ref Range   WBC 5.8 4.0 - 10.5 K/uL   RBC 3.89 3.87 - 5.11 MIL/uL   Hemoglobin 12.4 12.0 - 15.0 g/dL   HCT 36.3 36.0 - 46.0 %   MCV 93.3 78.0 - 100.0 fL    MCH 31.9 26.0 - 34.0 pg   MCHC 34.2 30.0 - 36.0 g/dL   RDW 13.6 11.5 - 15.5 %   Platelets 312 150 - 400 K/uL   Neutrophils Relative % 65 43 - 77 %   Neutro Abs 3.8 1.7 - 7.7 K/uL   Lymphocytes Relative 23 12 - 46 %   Lymphs Abs 1.3 0.7 - 4.0 K/uL   Monocytes Relative 7 3 - 12 %   Monocytes Absolute 0.4 0.1 - 1.0 K/uL   Eosinophils Relative 4 0 - 5 %   Eosinophils Absolute 0.2 0.0 - 0.7 K/uL   Basophils Relative 1 0 - 1 %   Basophils Absolute 0.1 0.0 - 0.1 K/uL   Smear Review Criteria for review not met   Thyroid Panel With TSH  Result Value Ref Range   T4, Total 6.5 4.5 - 12.0 ug/dL   T3 Uptake 27 22.0 - 35.0 %   Free Thyroxine Index 1.8 1.4 - 3.8   TSH 3.780 0.350 - 4.500 uIU/mL  IFOBT POC (occult bld, rslt in office)  Result Value Ref Range   IFOBT Negative    Dg Elbow Complete Left  09/22/2014   CLINICAL DATA:  74 year old female who [place] with acute pain. Initial encounter.  EXAM: LEFT ELBOW - COMPLETE 3+ VIEW  COMPARISON:  Left shoulder series from today reported separately.  FINDINGS: No joint effusion identified. Joint spaces and alignment at the left elbow are preserved. The radial head appears intact. No acute fracture or dislocation identified.  IMPRESSION: No acute fracture or dislocation identified about the left elbow.   Electronically Signed   By: Genevie Ann M.D.   On: 09/22/2014 13:19   Dg Shoulder Left  09/22/2014   CLINICAL DATA:  74 year old [place] Pain. Initial encounter.  EXAM: LEFT SHOULDER - 2+ VIEW  COMPARISON:  None.  FINDINGS: Comminuted left shoulder fracture dislocation. Proximal humeral head fracture with anterior dislocation and impaction of the humeral head on the glenoid. Laterally displaced butterfly fragment.  No definite left scapula or acute left clavicle fracture. Possible healed chronic left clavicle midshaft fracture.  Visible left ribs and lung parenchyma within normal limits.  IMPRESSION: Comminuted left  shoulder fracture dislocation with humeral head fracture, anterior dislocation, and impaction on the glenoid.   Electronically Signed   By: Genevie Ann M.D.   On: 09/22/2014 13:18   Dg Shoulder Left Port  09/22/2014   CLINICAL DATA:  74 year old female with left shoulder fracture dislocation, reduction attempt. Initial encounter.  EXAM: LEFT SHOULDER - 1 VIEW  COMPARISON:  1256 hours today.  FINDINGS: Portable scapula Y-view at 1520 hours. Persistent anterior subacromial glenohumeral joint dislocation.  IMPRESSION: Persistent anterior glenohumeral joint dislocation.   Electronically Signed   By: Genevie Ann M.D.   On: 09/22/2014 15:41    MDM  Diagnosis #1 fall #2 fracture dislocation of left  humerus #3 contusion left elbow Final diagnoses:  None        Orlie Dakin, MD 09/22/14 [phone]

## 2014-09-22 NOTE — ED Notes (Signed)
Bed: TT01 Expected date:  Expected time:  Means of arrival:  Comments: Fall/shoulder pain

## 2014-09-22 NOTE — ED Provider Notes (Signed)
Procedural sedation Performed by: Blanchie Dessert Consent: Verbal consent obtained. Risks and benefits: risks, benefits and alternatives were discussed Required items: required blood products, implants, devices, and special equipment available Patient identity confirmed: arm band and provided demographic data Time out: Immediately prior to procedure a "time out" was called to verify the correct patient, procedure, equipment, support staff and site/side marked as required.  Sedation type: moderate (conscious) sedation NPO time confirmed and considedered  Sedatives: PROPOFOL  Physician Time at Bedside: 30  Vitals: Vital signs were monitored during sedation. Cardiac Monitor, pulse oximeter Patient tolerance: Patient tolerated the procedure well with no immediate complications. Comments: Pt with uneventful recovered. Returned to pre-procedural sedation baseline   Dr. Theda Sers present at bedside and attempting reduction of left shoulder dislocation. Patient was sedated without any complications. Shoulder was unable to be reduced.  Blanchie Dessert, MD 09/22/14 [phone]

## 2014-09-22 NOTE — ED Notes (Signed)
Pt's friend at bedside

## 2014-09-22 NOTE — ED Notes (Signed)
Ortho tech called for procedure.

## 2014-09-22 NOTE — Progress Notes (Signed)
Clinical Social Work Department BRIEF PSYCHOSOCIAL ASSESSMENT 09/22/2014  Patient:  Allison Chavez, Allison Chavez     Account Number:  [id number]     [place] date:  09/22/2014  Clinical Social Worker:  Tilda Burrow, CLINICAL SOCIAL WORKER  Date/Time:  09/22/2014 08:32 AM  Referred by:  CSW  Date Referred:  09/22/2014 Referred for  Other - See comment   Other Referral:   Interview type:  Patient Other interview type:    PSYCHOSOCIAL DATA Living Status:  ALONE Admitted from facility:   Level of care:   Primary support name:  Lattie Haw Primary support relationship to patient:  FRIEND Degree of support available:   Pt stated that Lisa/Friend can be considered one of her primary supports. Lattie Haw was present at bedside on the 6th floor.    CURRENT CONCERNS Current Concerns  Adjustment to Illness   Other Concerns:    SOCIAL WORK ASSESSMENT / PLAN CSW met with pt at bedside. Friend was present. Pt confirms that she presents to [facility] due to slipping on the curb today. Pt stated " I was thinking about something else and I did not look down."    Pt informed CSW that she lives at home alone in [place].    Pt informed CSW that prior to coming into [facility] today she has been able to complete her ADL's independnelty. Pt stated that her left shoulder is injured. Pt stated that she is right handed. CSW asked pt if she were interested in going to a facility for rehab. However, the pt declined.    Physian came to bedside while in ED. However, the pt is now on the 6th floor. Pt appeared be confident in the fact of not wanting to go to a facility. Pt wishes to return home.    CSW asked pt if she had any questions. However, the pt stated that she does not have any questions at this time.   Assessment/plan status:  No Further Intervention Required Other assessment/ plan:   Patient stated that she does not have any questions. Patient states that she is not interested in going to a facility for rehab. There are not  other social work needs at this time.   Information/referral to community resources:    PATIENT'S/FAMILY'S RESPONSE TO PLAN OF CARE: Friend and pt states that her left arm is injured. However, because she is right handed they stated that they beleive the pt will be fine returning home.     Pt has great self determination and appears to be confident with the decision to return home.       Willette Brace [phone] ED CSW 09/22/2014 9:11 PM

## 2014-09-22 NOTE — ED Notes (Signed)
Pt transported to radiology via wheelchair with tech. 

## 2014-09-23 ENCOUNTER — Observation Stay (HOSPITAL_COMMUNITY): Payer: Medicare Other

## 2014-09-23 ENCOUNTER — Observation Stay (HOSPITAL_COMMUNITY): Payer: Medicare Other | Admitting: Anesthesiology

## 2014-09-23 ENCOUNTER — Encounter (HOSPITAL_COMMUNITY)
Admission: EM | Disposition: A | Discharge status: Home / Self Care | Payer: Self-pay | Source: Home / Self Care | Attending: Emergency Medicine

## 2014-09-23 ENCOUNTER — Encounter (HOSPITAL_COMMUNITY): Payer: Self-pay | Admitting: Anesthesiology

## 2014-09-23 DIAGNOSIS — G8918 Other acute postprocedural pain: Secondary | ICD-10-CM | POA: Diagnosis not present

## 2014-09-23 DIAGNOSIS — S42202A Unspecified fracture of upper end of left humerus, initial encounter for closed fracture: Secondary | ICD-10-CM | POA: Diagnosis not present

## 2014-09-23 DIAGNOSIS — S43006A Unspecified dislocation of unspecified shoulder joint, initial encounter: Secondary | ICD-10-CM | POA: Diagnosis not present

## 2014-09-23 DIAGNOSIS — H269 Unspecified cataract: Secondary | ICD-10-CM | POA: Diagnosis not present

## 2014-09-23 DIAGNOSIS — F419 Anxiety disorder, unspecified: Secondary | ICD-10-CM | POA: Diagnosis not present

## 2014-09-23 DIAGNOSIS — I10 Essential (primary) hypertension: Secondary | ICD-10-CM | POA: Diagnosis not present

## 2014-09-23 DIAGNOSIS — M25512 Pain in left shoulder: Secondary | ICD-10-CM | POA: Diagnosis not present

## 2014-09-23 DIAGNOSIS — W19XXXA Unspecified fall, initial encounter: Secondary | ICD-10-CM | POA: Diagnosis not present

## 2014-09-23 DIAGNOSIS — Z9889 Other specified postprocedural states: Secondary | ICD-10-CM | POA: Diagnosis not present

## 2014-09-23 DIAGNOSIS — S42252A Displaced fracture of greater tuberosity of left humerus, initial encounter for closed fracture: Secondary | ICD-10-CM | POA: Diagnosis not present

## 2014-09-23 HISTORY — PX: ORIF SHOULDER FRACTURE: SHX5035

## 2014-09-23 SURGERY — OPEN REDUCTION INTERNAL FIXATION (ORIF) SHOULDER FRACTURE
Anesthesia: Regional | Site: Shoulder | Laterality: Left

## 2014-09-23 MED ORDER — BISACODYL 5 MG PO TBEC: 5.0000 mg | DELAYED_RELEASE_TABLET | Freq: Every day | ORAL | Status: DC | PRN

## 2014-09-23 MED ORDER — PROPOFOL 10 MG/ML IV BOLUS
INTRAVENOUS | Status: AC
  Filled 2014-09-23: qty 20

## 2014-09-23 MED ORDER — LIDOCAINE HCL (CARDIAC) 20 MG/ML IV SOLN
INTRAVENOUS | Status: AC
  Filled 2014-09-23: qty 5

## 2014-09-23 MED ORDER — ONDANSETRON HCL 4 MG PO TABS: 4.0000 mg | ORAL_TABLET | Freq: Four times a day (QID) | ORAL | Status: DC | PRN

## 2014-09-23 MED ORDER — ROPIVACAINE HCL 5 MG/ML IJ SOLN
INTRAMUSCULAR | Status: DC | PRN
  Administered 2014-09-23: 20 mL via PERINEURAL

## 2014-09-23 MED ORDER — SODIUM CHLORIDE 0.9 % IV SOLN
10.0000 mg | INTRAVENOUS | Status: DC | PRN
  Administered 2014-09-23: 20 ug/min via INTRAVENOUS

## 2014-09-23 MED ORDER — METOCLOPRAMIDE HCL 10 MG PO TABS: 5.0000 mg | ORAL_TABLET | Freq: Three times a day (TID) | ORAL | Status: DC | PRN

## 2014-09-23 MED ORDER — CEFAZOLIN SODIUM 1-5 GM-% IV SOLN
1.0000 g | Freq: Four times a day (QID) | INTRAVENOUS | Status: AC
  Administered 2014-09-23 – 2014-09-24 (×3): 1 g via INTRAVENOUS
  Filled 2014-09-23 (×3): qty 50

## 2014-09-23 MED ORDER — FENTANYL CITRATE 0.05 MG/ML IJ SOLN
INTRAMUSCULAR | Status: AC
Start: 2014-09-23 — End: 2014-09-23
  Filled 2014-09-23: qty 5

## 2014-09-23 MED ORDER — ALBUTEROL SULFATE (2.5 MG/3ML) 0.083% IN NEBU: 2.5000 mg | INHALATION_SOLUTION | RESPIRATORY_TRACT | Status: DC | PRN

## 2014-09-23 MED ORDER — POLYETHYLENE GLYCOL 3350 17 G PO PACK: 17.0000 g | PACK | Freq: Every day | ORAL | Status: DC | PRN

## 2014-09-23 MED ORDER — PROPOFOL 10 MG/ML IV BOLUS
INTRAVENOUS | Status: DC | PRN
  Administered 2014-09-23: 110 mg via INTRAVENOUS

## 2014-09-23 MED ORDER — ONDANSETRON HCL 4 MG/2ML IJ SOLN: 4.0000 mg | Freq: Four times a day (QID) | INTRAMUSCULAR | Status: DC | PRN

## 2014-09-23 MED ORDER — METOCLOPRAMIDE HCL 5 MG/ML IJ SOLN: 5.0000 mg | Freq: Three times a day (TID) | INTRAMUSCULAR | Status: DC | PRN

## 2014-09-23 MED ORDER — GLYCOPYRROLATE 0.2 MG/ML IJ SOLN
INTRAMUSCULAR | Status: DC | PRN
Start: 2014-09-23 — End: 2014-09-23
  Administered 2014-09-23: 0.4 mg via INTRAVENOUS

## 2014-09-23 MED ORDER — DEXAMETHASONE SODIUM PHOSPHATE 10 MG/ML IJ SOLN
INTRAMUSCULAR | Status: AC
  Filled 2014-09-23: qty 1

## 2014-09-23 MED ORDER — LACTATED RINGERS IV SOLN
INTRAVENOUS | Status: DC
  Administered 2014-09-23: 18:00:00 via INTRAVENOUS

## 2014-09-23 MED ORDER — DEXAMETHASONE SODIUM PHOSPHATE 4 MG/ML IJ SOLN
INTRAMUSCULAR | Status: AC
  Filled 2014-09-23: qty 1

## 2014-09-23 MED ORDER — FLEET ENEMA 7-19 GM/118ML RE ENEM: 1.0000 | ENEMA | Freq: Once | RECTAL | Status: AC | PRN

## 2014-09-23 MED ORDER — LIDOCAINE HCL (CARDIAC) 20 MG/ML IV SOLN
INTRAVENOUS | Status: DC | PRN
  Administered 2014-09-23: 40 mg via INTRAVENOUS

## 2014-09-23 MED ORDER — FENTANYL CITRATE 0.05 MG/ML IJ SOLN
INTRAMUSCULAR | Status: AC
  Administered 2014-09-23: 100 ug
  Filled 2014-09-23: qty 2

## 2014-09-23 MED ORDER — DIAZEPAM 5 MG PO TABS: 2.5000 mg | ORAL_TABLET | Freq: Four times a day (QID) | ORAL | Status: DC | PRN

## 2014-09-23 MED ORDER — ROCURONIUM BROMIDE 100 MG/10ML IV SOLN
INTRAVENOUS | Status: DC | PRN
  Administered 2014-09-23: 30 mg via INTRAVENOUS

## 2014-09-23 MED ORDER — DOCUSATE SODIUM 100 MG PO CAPS
100.0000 mg | ORAL_CAPSULE | Freq: Two times a day (BID) | ORAL | Status: DC
  Administered 2014-09-23 – 2014-09-24 (×2): 100 mg via ORAL
  Filled 2014-09-23 (×2): qty 1

## 2014-09-23 MED ORDER — 0.9 % SODIUM CHLORIDE (POUR BTL) OPTIME
TOPICAL | Status: DC | PRN
  Administered 2014-09-23: 1000 mL

## 2014-09-23 MED ORDER — ONDANSETRON HCL 4 MG/2ML IJ SOLN
INTRAMUSCULAR | Status: AC
  Filled 2014-09-23: qty 2

## 2014-09-23 MED ORDER — DEXAMETHASONE SODIUM PHOSPHATE 4 MG/ML IJ SOLN
INTRAMUSCULAR | Status: DC | PRN
  Administered 2014-09-23: 4 mg via INTRAVENOUS
  Administered 2014-09-23: 4 mg via PERINEURAL

## 2014-09-23 MED ORDER — ARTIFICIAL TEARS OP OINT
TOPICAL_OINTMENT | OPHTHALMIC | Status: AC
  Filled 2014-09-23: qty 3.5

## 2014-09-23 MED ORDER — DEXAMETHASONE SODIUM PHOSPHATE 4 MG/ML IJ SOLN
INTRAMUSCULAR | Status: AC
  Filled 2014-09-23: qty 2

## 2014-09-23 MED ORDER — LACTATED RINGERS IV SOLN
INTRAVENOUS | Status: DC
  Administered 2014-09-23: 10:00:00 via INTRAVENOUS

## 2014-09-23 MED ORDER — OXYCODONE-ACETAMINOPHEN 5-325 MG PO TABS: 1.0000 | ORAL_TABLET | ORAL | Status: DC | PRN

## 2014-09-23 MED ORDER — HYDROMORPHONE HCL 1 MG/ML IJ SOLN
1.0000 mg | INTRAMUSCULAR | Status: DC | PRN
  Administered 2014-09-23: 1 mg via INTRAVENOUS
  Filled 2014-09-23 (×3): qty 1

## 2014-09-23 MED ORDER — ROCURONIUM BROMIDE 50 MG/5ML IV SOLN
INTRAVENOUS | Status: AC
  Filled 2014-09-23: qty 1

## 2014-09-23 MED ORDER — CEFAZOLIN SODIUM-DEXTROSE 2-3 GM-% IV SOLR
INTRAVENOUS | Status: DC | PRN
  Administered 2014-09-23: 2 g via INTRAVENOUS

## 2014-09-23 MED ORDER — ONDANSETRON HCL 4 MG/2ML IJ SOLN
INTRAMUSCULAR | Status: DC | PRN
  Administered 2014-09-23: 4 mg via INTRAVENOUS

## 2014-09-23 MED ORDER — NEOSTIGMINE METHYLSULFATE 10 MG/10ML IV SOLN
INTRAVENOUS | Status: AC
  Filled 2014-09-23: qty 1

## 2014-09-23 MED ORDER — NEOSTIGMINE METHYLSULFATE 10 MG/10ML IV SOLN
INTRAVENOUS | Status: DC | PRN
  Administered 2014-09-23: 2.5 mg via INTRAVENOUS

## 2014-09-23 MED ORDER — GLYCOPYRROLATE 0.2 MG/ML IJ SOLN
INTRAMUSCULAR | Status: AC
  Filled 2014-09-23: qty 2

## 2014-09-23 MED ORDER — PHENYLEPHRINE HCL 10 MG/ML IJ SOLN
INTRAMUSCULAR | Status: AC
  Filled 2014-09-23: qty 1

## 2014-09-23 MED ORDER — HYDROMORPHONE HCL 1 MG/ML IJ SOLN
0.5000 mg | INTRAMUSCULAR | Status: DC | PRN
  Administered 2014-09-24: 1 mg via INTRAVENOUS
  Filled 2014-09-23: qty 1

## 2014-09-23 SURGICAL SUPPLY — 64 items
ADH SKN CLS APL DERMABOND .7 (GAUZE/BANDAGES/DRESSINGS) ×1
BIT DRILL CANN 3.2MM (BIT) ×1 IMPLANT
CLOSURE WOUND 1/2 X4 (GAUZE/BANDAGES/DRESSINGS) ×1
COVER SURGICAL LIGHT HANDLE (MISCELLANEOUS) ×3 IMPLANT
DERMABOND ADVANCED (GAUZE/BANDAGES/DRESSINGS) ×2
DERMABOND ADVANCED .7 DNX12 (GAUZE/BANDAGES/DRESSINGS) ×1 IMPLANT
DRAPE C-ARM 42X72 X-RAY (DRAPES) ×3 IMPLANT
DRAPE INCISE IOBAN 66X45 STRL (DRAPES) ×6 IMPLANT
DRAPE POUCH INSTRU U-SHP 10X18 (DRAPES) ×3 IMPLANT
DRAPE U-SHAPE 47X51 STRL (DRAPES) ×3 IMPLANT
DRILL BIT CANN 3.2MM (BIT) ×2
DRSG EMULSION OIL 3X3 NADH (GAUZE/BANDAGES/DRESSINGS) ×3 IMPLANT
DRSG MEPILEX BORDER 4X4 (GAUZE/BANDAGES/DRESSINGS) ×3 IMPLANT
DRSG PAD ABDOMINAL 8X10 ST (GAUZE/BANDAGES/DRESSINGS) ×3 IMPLANT
ELECT REM PT RETURN 9FT ADLT (ELECTROSURGICAL) ×3
ELECTRODE REM PT RTRN 9FT ADLT (ELECTROSURGICAL) ×1 IMPLANT
GAUZE SPONGE 4X4 12PLY STRL (GAUZE/BANDAGES/DRESSINGS) ×3 IMPLANT
GLOVE BIO SURGEON STRL SZ7.5 (GLOVE) ×3 IMPLANT
GLOVE BIO SURGEON STRL SZ8 (GLOVE) ×3 IMPLANT
GLOVE BIOGEL PI IND STRL 6.5 (GLOVE) ×2 IMPLANT
GLOVE BIOGEL PI INDICATOR 6.5 (GLOVE) ×4
GLOVE ECLIPSE 6.5 STRL STRAW (GLOVE) ×9 IMPLANT
GLOVE EUDERMIC 7 POWDERFREE (GLOVE) ×3 IMPLANT
GLOVE SS BIOGEL STRL SZ 7.5 (GLOVE) ×1 IMPLANT
GLOVE SUPERSENSE BIOGEL SZ 7.5 (GLOVE) ×2
GOWN STRL REUS W/ TWL LRG LVL3 (GOWN DISPOSABLE) ×1 IMPLANT
GOWN STRL REUS W/ TWL XL LVL3 (GOWN DISPOSABLE) ×2 IMPLANT
GOWN STRL REUS W/TWL LRG LVL3 (GOWN DISPOSABLE) ×2
GOWN STRL REUS W/TWL XL LVL3 (GOWN DISPOSABLE) ×6
GUIDEWIRE THREADED 1.6 (WIRE) ×9 IMPLANT
KIT BASIN OR (CUSTOM PROCEDURE TRAY) ×3 IMPLANT
KIT ROOM TURNOVER OR (KITS) ×3 IMPLANT
MANIFOLD NEPTUNE II (INSTRUMENTS) ×3 IMPLANT
NDL SUT 6 .5 CRC .975X.05 MAYO (NEEDLE) ×1 IMPLANT
NEEDLE 22X1 1/2 (OR ONLY) (NEEDLE) IMPLANT
NEEDLE MAYO TAPER (NEEDLE) ×2
NS IRRIG 1000ML POUR BTL (IV SOLUTION) ×3 IMPLANT
PACK SHOULDER (CUSTOM PROCEDURE TRAY) ×3 IMPLANT
PAD ARMBOARD 7.5X6 YLW CONV (MISCELLANEOUS) ×6 IMPLANT
SCREW CANN 34MM (Screw) ×3 IMPLANT
SCREW CANN 38MM (Screw) ×3 IMPLANT
SCREW CANN P THRD/30 4.5 (Screw) ×3 IMPLANT
SCREW CANN P THRD/40 4.5 (Screw) ×6 IMPLANT
SPONGE LAP 4X18 X RAY DECT (DISPOSABLE) ×6 IMPLANT
STRIP CLOSURE SKIN 1/2X4 (GAUZE/BANDAGES/DRESSINGS) ×2 IMPLANT
SUCTION FRAZIER TIP 10 FR DISP (SUCTIONS) ×3 IMPLANT
SUT BONE WAX W31G (SUTURE) IMPLANT
SUT ETHIBOND NAB CT1 #1 30IN (SUTURE) ×6 IMPLANT
SUT FIBERWIRE #2 38 T-5 BLUE (SUTURE)
SUT MNCRL AB 3-0 PS2 18 (SUTURE) ×3 IMPLANT
SUT VIC AB 0 CT1 27 (SUTURE) ×2
SUT VIC AB 0 CT1 27XBRD ANBCTR (SUTURE) ×1 IMPLANT
SUT VIC AB 1 CT1 27 (SUTURE) ×2
SUT VIC AB 1 CT1 27XBRD ANBCTR (SUTURE) ×1 IMPLANT
SUT VIC AB 2-0 CT1 27 (SUTURE) ×2
SUT VIC AB 2-0 CT1 TAPERPNT 27 (SUTURE) ×1 IMPLANT
SUT VICRYL 4-0 PS2 18IN ABS (SUTURE) IMPLANT
SUTURE FIBERWR #2 38 T-5 BLUE (SUTURE) IMPLANT
SYR CONTROL 10ML LL (SYRINGE) IMPLANT
TOWEL OR 17X24 6PK STRL BLUE (TOWEL DISPOSABLE) ×3 IMPLANT
TOWEL OR 17X26 10 PK STRL BLUE (TOWEL DISPOSABLE) ×3 IMPLANT
WASHER FOR 4.5 SCREWS (Washer) ×9 IMPLANT
WATER STERILE IRR 1000ML POUR (IV SOLUTION) ×3 IMPLANT
YANKAUER SUCT BULB TIP NO VENT (SUCTIONS) IMPLANT

## 2014-09-23 NOTE — Op Note (Signed)
09/22/2014 - 09/23/2014  12:10 PM  PATIENT:   Allison Chavez  74 y.o. female  PRE-OPERATIVE DIAGNOSIS:  LEFT SHOULDER 2 PART FRACTURE/DISLOCATION  POST-OPERATIVE DIAGNOSIS:  SAME  PROCEDURE:  ORIF LEFT SHOULDER 2 PART FRACTURE/DISLOCATION  SURGEON:  Topacio Cella, Metta Clines. M.D.  ASSISTANTS: [place] pac   ANESTHESIA:   GET + ISB  EBL: 50  SPECIMEN:  none  Drains: none   PATIENT DISPOSITION:  PACU - hemodynamically stable.    PLAN OF CARE: Admit for overnight observation  Dictation# [id number]

## 2014-09-23 NOTE — Anesthesia Procedure Notes (Addendum)
Procedure Name: Intubation Date/Time: 09/23/2014 10:56 AM Performed by: Rush Farmer E Pre-anesthesia Checklist: Patient identified, Suction available, Emergency Drugs available, Patient being monitored and Timeout performed Patient Re-evaluated:Patient Re-evaluated prior to inductionOxygen Delivery Method: Circle system utilized Preoxygenation: Pre-oxygenation with 100% oxygen Intubation Type: IV induction Ventilation: Mask ventilation without difficulty Laryngoscope Size: Mac and 3 Grade View: Grade I Tube type: Oral Tube size: 7.0 mm Number of attempts: 1 Airway Equipment and Method: Stylet and LTA kit utilized Placement Confirmation: ETT inserted through vocal cords under direct vision,  positive ETCO2 and breath sounds checked- equal and bilateral Secured at: 21 cm Tube secured with: Tape Dental Injury: Teeth and Oropharynx as per pre-operative assessment    Anesthesia Regional Block:  Interscalene brachial plexus block  Pre-Anesthetic Checklist: ,, timeout performed, Correct Patient, Correct Site, Correct Laterality, Correct Procedure, Correct Position, site marked, Risks and benefits discussed,  Surgical consent,  Pre-op evaluation,  At surgeon's request and post-op pain management  Laterality: Upper and Left  Prep: chloraprep       Needles:  Injection technique: Single-shot  Needle Type: Echogenic Stimulator Needle          Additional Needles:  Procedures: ultrasound guided (picture in chart) and nerve stimulator Interscalene brachial plexus block  Nerve Stimulator or Paresthesia:  Response: deltoid, 0.5 mA,   Additional Responses:   Narrative:  Injection made incrementally with aspirations every 5 mL.  Performed by: Personally  Anesthesiologist: Dari Carpenito, CHRIS  Additional Notes: H+P and labs reviewed, risks and benefits discussed with patient, procedure tolerated well without complications

## 2014-09-23 NOTE — Progress Notes (Signed)
09/23/14 Nursing 0400 Patient unable to void independently. I/O cath at this time for 800 cc urine

## 2014-09-23 NOTE — Progress Notes (Signed)
UR completed 

## 2014-09-23 NOTE — Anesthesia Postprocedure Evaluation (Signed)
  Anesthesia Post-op Note  Patient: Allison Chavez  Procedure(s) Performed: Procedure(s): OPEN REDUCTION INTERNAL FIXATION (ORIF) SHOULDER FRACTURE (Left)  Patient Location: PACU  Anesthesia Type:General and Regional  Level of Consciousness: awake  Airway and Oxygen Therapy: Patient Spontanous Breathing  Post-op Pain: none  Post-op Assessment: Post-op Vital signs reviewed, Patient's Cardiovascular Status Stable, Respiratory Function Stable, Patent Airway, No signs of Nausea or vomiting and Pain level controlled  Post-op Vital Signs: Reviewed and stable  Last Vitals:  Filed Vitals:   09/23/14 1345  BP: 137/63  Pulse: 75  Temp:   Resp: 11    Complications: No apparent anesthesia complications

## 2014-09-23 NOTE — Progress Notes (Signed)
PHARMACY NOTE:   CITALOPRAM DOSAGE  Patient receiving citalopram (Celexa) 60 mg daily as taken prior to admission.  Home dosage confirmed via physician office records.  Please note that because of the potential for dosage-dependent QTc prolongation and increased risk for life-threatening ventricular arrhythmias, FDA recommends limiting citalopram dosage to 20 mg daily in patients older than 72 years of age.   RECOMMEND: 1.  Consider risk vs benefit of current citalopram dosage 2.  Consider checking EKG to assess QTc interval.  Clayburn Pert, PharmD, BCPS Pager: [phone] 09/23/2014  6:02 AM

## 2014-09-23 NOTE — Anesthesia Preprocedure Evaluation (Addendum)
Anesthesia Evaluation  Patient identified by MRN, date of birth, ID band Patient awake    Reviewed: Allergy & Precautions, NPO status , Patient's Chart, lab work & pertinent test results  History of Anesthesia Complications Negative for: history of anesthetic complications  Airway Mallampati: II  TM Distance: >3 FB Neck ROM: Full    Dental  (+) Teeth Intact   Pulmonary neg shortness of breath, neg sleep apnea, neg COPDneg recent URI, former smoker,  breath sounds clear to auscultation        Cardiovascular hypertension, Pt. on medications Rhythm:Regular     Neuro/Psych PSYCHIATRIC DISORDERS Anxiety Depression negative neurological ROS     GI/Hepatic negative GI ROS, Neg liver ROS,   Endo/Other  negative endocrine ROS  Renal/GU Renal InsufficiencyRenal disease     Musculoskeletal  (+) Arthritis -, Left shoulder fracture   Abdominal   Peds  Hematology negative hematology ROS (+)   Anesthesia Other Findings   Reproductive/Obstetrics                            Anesthesia Physical Anesthesia Plan  ASA: II  Anesthesia Plan: Regional and General   Post-op Pain Management:    Induction: Intravenous  Airway Management Planned: Oral ETT  Additional Equipment: None  Intra-op Plan:   Post-operative Plan: Extubation in OR  Informed Consent: I have reviewed the patients History and Physical, chart, labs and discussed the procedure including the risks, benefits and alternatives for the proposed anesthesia with the patient or authorized representative who has indicated his/her understanding and acceptance.   Dental advisory given  Plan Discussed with: Surgeon and CRNA  Anesthesia Plan Comments:         Anesthesia Quick Evaluation

## 2014-09-23 NOTE — Transfer of Care (Signed)
Immediate Anesthesia Transfer of Care Note  Patient: Allison Chavez  Procedure(s) Performed: Procedure(s): OPEN REDUCTION INTERNAL FIXATION (ORIF) SHOULDER FRACTURE (Left)  Patient Location: PACU  Anesthesia Type:General  Level of Consciousness: awake, alert  and oriented  Airway & Oxygen Therapy: Patient Spontanous Breathing and Patient connected to nasal cannula oxygen  Post-op Assessment: Report given to RN and Post -op Vital signs reviewed and stable  Post vital signs: Reviewed and stable  Last Vitals:  Filed Vitals:   09/23/14 0500  BP: 128/58  Pulse: 82  Temp: 36.8 C  Resp: 16    Complications: No apparent anesthesia complications

## 2014-09-24 MED ORDER — OXYCODONE HCL 5 MG PO TABS: 5.0000 mg | ORAL_TABLET | ORAL | Status: DC | PRN

## 2014-09-24 MED ORDER — OXYCODONE HCL 5 MG PO TABS
5.0000 mg | ORAL_TABLET | ORAL | Status: DC | PRN
  Administered 2014-09-24: 10 mg via ORAL
  Filled 2014-09-24: qty 2

## 2014-09-24 MED ORDER — DIAZEPAM 5 MG PO TABS: 2.5000 mg | ORAL_TABLET | Freq: Four times a day (QID) | ORAL | Status: DC | PRN

## 2014-09-24 NOTE — Discharge Summary (Signed)
PATIENT ID:      Allison Chavez  MRN:     [id number] DOB/AGE:    [birth year] / 74 y.o.     DISCHARGE SUMMARY  ADMISSION DATE:    09/22/2014 DISCHARGE DATE:    ADMISSION DIAGNOSIS: LEFT SHOULDER FRACTURE Past Medical History  Diagnosis Date  . Hypertension   . Anxiety   . Depression   . DJD (degenerative joint disease) of cervical spine   . DJD of shoulder, right   . Renal insufficiency, mild   . Cataract     DISCHARGE DIAGNOSIS:   Active Problems:   Shoulder dislocation   PROCEDURE: Procedure(s): OPEN REDUCTION INTERNAL FIXATION (ORIF) SHOULDER FRACTURE on 09/22/2014 - 09/23/2014  CONSULTS:     HISTORY:  See H&P in chart.  Patient was admitted after a fall with shoulder fracture dislocation that was unable to be reduced under conscious sedation in ED x 2 .  They were brought to the operating room on 09/22/2014 - 09/23/2014 and underwent Procedure(s): OPEN REDUCTION INTERNAL FIXATION (ORIF) SHOULDER FRACTURE.    They were given perioperative antibiotics: Anti-infectives    Start     Dose/Rate Route Frequency Ordered Stop   09/23/14 1545  ceFAZolin (ANCEF) IVPB 1 g/50 mL premix     1 g 100 mL/hr over 30 Minutes Intravenous Every 6 hours 09/23/14 1531 09/24/14 0422    .  Patient underwent the above named procedure and tolerated it well. The following day they were hemodynamically stable and pain was controlled on oral analgesics. They were neurovascularly intact to the operative extremity. OT was ordered and worked with patient per protocol. They were medically and orthopaedically stable for discharge on  day 1 and home health was arranged    DIAGNOSTIC STUDIES:  RECENT RADIOGRAPHIC STUDIES :  Dg Elbow Complete Left  09/22/2014   CLINICAL DATA:  74 year old female who [place] with acute pain. Initial encounter.  EXAM: LEFT ELBOW - COMPLETE 3+ VIEW  COMPARISON:  Left shoulder series from today reported separately.  FINDINGS: No joint effusion identified. Joint  spaces and alignment at the left elbow are preserved. The radial head appears intact. No acute fracture or dislocation identified.  IMPRESSION: No acute fracture or dislocation identified about the left elbow.   Electronically Signed   By: Genevie Ann M.D.   On: 09/22/2014 13:19   Ct Shoulder Left Wo Contrast  09/23/2014   CLINICAL DATA:  Left shoulder pain after fall. Attempted reduction. Evaluate for loose body impeding reduction.  EXAM: CT OF THE LEFT SHOULDER WITHOUT CONTRAST; 3-DIMENSIONAL CT IMAGE RENDERING ON ACQUISITION WORKSTATION  TECHNIQUE: Multidetector CT imaging was performed according to the standard protocol. Multiplanar CT image reconstructions were also generated. 3-dimensional CT images were rendered by post-processing of the original CT data at the CT scanner. The 3-dimensional CT images were interpreted, and findings were reported in the accompanying complete CT report for this study.  COMPARISON:  Radiographs earlier this day  FINDINGS: Comminuted fracture of the posterior lateral humeral head. The humerus head is anterior inferiorly dislocated, and perched on the inferior glenoid. The comminuted fracture fragments are mildly displaced, and abutting the glenoid fossa. No definite glenoid fracture. There is a moderate lipohemarthrosis and associated soft tissue edema. The acromioclavicular joint is congruent. Remote fracture of the distal clavicle. There is mild breathing motion artifact in the included lungs.  IMPRESSION: Fracture dislocation of the shoulder with comminuted fracture of the posterior lateral humeral head and anterior inferior dislocation of the  humerus. The humeral head is perched on the inferior aspect of the bony glenoid, multiple fracture fragments are seen in the glenoid fossa. There is associated shoulder lipohemarthrosis.   Electronically Signed   By: Jeb Levering M.D.   On: 09/23/2014 00:23   Ct 3d Recon At Scanner  09/23/2014   CLINICAL DATA:  Left shoulder pain  after fall. Attempted reduction. Evaluate for loose body impeding reduction.  EXAM: CT OF THE LEFT SHOULDER WITHOUT CONTRAST; 3-DIMENSIONAL CT IMAGE RENDERING ON ACQUISITION WORKSTATION  TECHNIQUE: Multidetector CT imaging was performed according to the standard protocol. Multiplanar CT image reconstructions were also generated. 3-dimensional CT images were rendered by post-processing of the original CT data at the CT scanner. The 3-dimensional CT images were interpreted, and findings were reported in the accompanying complete CT report for this study.  COMPARISON:  Radiographs earlier this day  FINDINGS: Comminuted fracture of the posterior lateral humeral head. The humerus head is anterior inferiorly dislocated, and perched on the inferior glenoid. The comminuted fracture fragments are mildly displaced, and abutting the glenoid fossa. No definite glenoid fracture. There is a moderate lipohemarthrosis and associated soft tissue edema. The acromioclavicular joint is congruent. Remote fracture of the distal clavicle. There is mild breathing motion artifact in the included lungs.  IMPRESSION: Fracture dislocation of the shoulder with comminuted fracture of the posterior lateral humeral head and anterior inferior dislocation of the humerus. The humeral head is perched on the inferior aspect of the bony glenoid, multiple fracture fragments are seen in the glenoid fossa. There is associated shoulder lipohemarthrosis.   Electronically Signed   By: Jeb Levering M.D.   On: 09/23/2014 00:23   Dg Shoulder Left  09/23/2014   CLINICAL DATA:  Open reduction internal fixation of the comminuted left humeral head fracture.  EXAM: DG C-ARM 61-120 MIN; LEFT SHOULDER - 2+ VIEW  COMPARISON:  Shoulder CT 09/22/2014  FINDINGS: Intraoperative fluoroscopic images were obtained. Three surgical screws were placed in the left humeral head. Again noted is a displaced bone fragment. The left shoulder is located on these views.   IMPRESSION: Reduction and internal fixation of the left humeral head fracture.   Electronically Signed   By: Markus Daft M.D.   On: 09/23/2014 12:57   Dg Shoulder Left  09/22/2014   CLINICAL DATA:  74 year old [place] Pain. Initial encounter.  EXAM: LEFT SHOULDER - 2+ VIEW  COMPARISON:  None.  FINDINGS: Comminuted left shoulder fracture dislocation. Proximal humeral head fracture with anterior dislocation and impaction of the humeral head on the glenoid. Laterally displaced butterfly fragment.  No definite left scapula or acute left clavicle fracture. Possible healed chronic left clavicle midshaft fracture.  Visible left ribs and lung parenchyma within normal limits.  IMPRESSION: Comminuted left shoulder fracture dislocation with humeral head fracture, anterior dislocation, and impaction on the glenoid.   Electronically Signed   By: Genevie Ann M.D.   On: 09/22/2014 13:18   Dg Shoulder Left Port  09/22/2014   CLINICAL DATA:  Post left shoulder reduction  EXAM: LEFT SHOULDER - 1 VIEW  COMPARISON:  Multiple priors, most recently 09/22/2014 at 1520 hours  FINDINGS: Persistent anterior shoulder dislocation.  Associated mildly comminuted fracture involving the posterolateral humeral head.  Glenoid rim is suboptimally evaluated.  Degenerative changes of the acromioclavicular joint.  IMPRESSION: Persistent anterior shoulder dislocation.  Associated fracture involving the posterolateral humeral head.   Electronically Signed   By: Julian Hy M.D.   On: 09/22/2014  18:31   Dg Shoulder Left Port  09/22/2014   CLINICAL DATA:  74 year old female with left shoulder fracture dislocation, reduction attempt. Initial encounter.  EXAM: LEFT SHOULDER - 1 VIEW  COMPARISON:  1256 hours today.  FINDINGS: Portable scapula Y-view at 1520 hours. Persistent anterior subacromial glenohumeral joint dislocation.  IMPRESSION: Persistent anterior glenohumeral joint dislocation.   Electronically Signed   By: Genevie Ann M.D.   On: 09/22/2014 15:41   Dg C-arm 61-120 Min  09/23/2014   CLINICAL DATA:  Open reduction internal fixation of the comminuted left humeral head fracture.  EXAM: DG C-ARM 61-120 MIN; LEFT SHOULDER - 2+ VIEW  COMPARISON:  Shoulder CT 09/22/2014  FINDINGS: Intraoperative fluoroscopic images were obtained. Three surgical screws were placed in the left humeral head. Again noted is a displaced bone fragment. The left shoulder is located on these views.  IMPRESSION: Reduction and internal fixation of the left humeral head fracture.   Electronically Signed   By: Markus Daft M.D.   On: 09/23/2014 12:57    RECENT VITAL SIGNS:  Patient Vitals for the past 24 hrs:  BP Temp Temp src Pulse Resp SpO2  09/24/14 0600 (!) 146/70 mmHg 97.8 F (36.6 C) Oral 99 18 96 %  09/24/14 0200 (!) 107/56 mmHg 98.6 F (37 C) Oral 80 16 97 %  09/23/14 2203 113/60 mmHg 99.5 F (37.5 C) Oral 81 18 97 %  09/23/14 1517 113/65 mmHg 98.9 F (37.2 C) Oral 75 18 96 %  09/23/14 1448 (!) 150/69 mmHg - - - - -  09/23/14 1445 104/82 mmHg 98.2 F (36.8 C) - 86 (!) 22 92 %  09/23/14 1430 130/74 mmHg - - 80 16 93 %  09/23/14 1415 (!) 152/66 mmHg - - 78 12 92 %  09/23/14 1400 (!) 137/55 mmHg - - 77 14 91 %  09/23/14 1345 137/63 mmHg - - 75 11 91 %  09/23/14 1330 122/63 mmHg - - 72 13 92 %  09/23/14 1315 (!) 88/59 mmHg - - 78 18 91 %  09/23/14 1300 (!) 90/57 mmHg - - 73 14 94 %  09/23/14 1245 97/64 mmHg - - 78 19 93 %  09/23/14 1230 107/65 mmHg 99.1 F (37.3 C) - 82 17 (!) 88 %  .  RECENT EKG RESULTS:    Orders placed or performed in visit on 04/08/13  . EKG 12-Lead    DISCHARGE INSTRUCTIONS:  Discharge Instructions    DME Other see comment    Complete by:  As directed   3 in 1 commode           DISCHARGE MEDICATIONS:     Medication List    TAKE these medications        aspirin 325 MG tablet  Take 325 mg by mouth every 6 (six) hours as needed for moderate pain (pain).     cetirizine 10 MG tablet   Commonly known as:  ZYRTEC  Take 1 tablet (10 mg total) by mouth daily.     citalopram 40 MG tablet  Commonly known as:  CELEXA  Take 1 1/2 tablets daily or as directed.     clorazepate 7.5 MG tablet  Commonly known as:  TRANXENE  TAKE (1) TABLET DAILY AS NEEDED.     desonide 0.05 % cream  Commonly known as:  DESOWEN  Apply topically 2 (two) times daily.     diazepam 5 MG tablet  Commonly known as:  VALIUM  Take 0.5-1 tablets (2.5-5  mg total) by mouth every 6 (six) hours as needed for muscle spasms or sedation.     IBUPROFEN PO  Take 2 tablets by mouth every 6 (six) hours as needed (pain).     losartan-hydrochlorothiazide 50-12.5 MG per tablet  Commonly known as:  HYZAAR  Take 1 tablet by mouth daily.     oxyCODONE 5 MG immediate release tablet  Commonly known as:  Oxy IR/ROXICODONE  Take 1 tablet (5 mg total) by mouth every 4 (four) hours as needed for severe pain.     triamcinolone cream 0.1 %  Commonly known as:  KENALOG  Apply 1 application topically 2 (two) times daily as needed (skin).     zolpidem 5 MG tablet  Commonly known as:  AMBIEN  TAKE 1 TABLET AT BEDTIME IF NEEDED FOR SLEEP.        FOLLOW UP VISIT:       Follow-up Information    Follow up with Marin Shutter, MD.   Specialty:  Orthopedic Surgery   Why:  call to be seen in 10-14 days   Contact information:   [address] [place] [address] [place] [address] [phone]       DISCHARGE TO: Home  DISPOSITION: Good  DISCHARGE CONDITION:  Festus Barren for Dr. Justice Britain 09/24/2014, 11:53 AM

## 2014-09-24 NOTE — Progress Notes (Signed)
Occupational Therapy Evaluation Patient Details Name: Allison Chavez MRN: [id number] DOB: 1940/10/07 Today's Date: 09/24/2014    History of Present Illness s/p fall with L humerus fx/dislocation. Underwent ORIF   Clinical Impression   Began education with pt/friend regarding shoulder precautions, HEP for L elbow and ADL retraining. Pt appears to be having a difficult time remembering all information. Will return for a second session. Pt will be appropriate for D/C today. Since pt lives alone and help will only be available this weekend, recommend pt have HHOT/PT follow up for further education and to reinforce HEP and precautions.Also recommended 3 in 1 to reduce falls in shower.    Follow Up Recommendations  Supervision/Assistance - 24 hour;Home health OT (initial 24/7)    Equipment Recommendations  3 in 1 bedside comode           Precautions / Restrictions Precautions Precautions: Fall Precaution Comments: keep L arm close to body Required Braces or Orthoses: Sling Restrictions Weight Bearing Restrictions: Yes LUE Weight Bearing: Non weight bearing      Mobility Bed Mobility Overal bed mobility: Needs Assistance Bed Mobility: Sidelying to Sit   Sidelying to sit: Supervision       General bed mobility comments: Pt trying to use bed rails. Explained importance of teaching pt how to complete bed mobility without use of rails.  Transfers Overall transfer level: Needs assistance Equipment used: 1 person hand held assist Transfers: Sit to/from [facility] Sit to Stand: Min guard Stand pivot transfers: Min guard       General transfer comment: unsteady at times most likely due to meds    Balance Overall balance assessment: Needs assistance           Standing balance-Leahy Scale: Fair Standing balance comment: using furntiure to balance herself                            ADL Overall ADL's : Needs assistance/impaired      Grooming: Minimal assistance   Upper Body Bathing: Moderate assistance   Lower Body Bathing: Minimal assistance   Upper Body Dressing : Maximal assistance   Lower Body Dressing: Moderate assistance   Toilet Transfer: Magazine features editor Details (indicate cue type and reason): unsteady? related to meds Toileting- Clothing Manipulation and Hygiene: Minimal assistance       Functional mobility during ADLs: Min guard (unsteady at times) General ADL Comments: Began education regarding compensatory techniques for ADL Pt requires max cues. Needs more education. Concerned about pt being on her own. Family to stay with her over the weekend. Friend can stop by daily.                     Pertinent Vitals/Pain Pain Assessment: 0-10 Pain Score: 4  Pain Location: L shoulder Pain Descriptors / Indicators: Aching Pain Intervention(s): Limited activity within patient's tolerance;Monitored during session;Repositioned;Ice applied     Hand Dominance Left   Extremity/Trunk Assessment Upper Extremity Assessment Upper Extremity Assessment: LUE deficits/detail LUE Deficits / Details: no shoulder ROM. Dangle only. elbow AROM limited due to pain. PROM WFL. hand ROM WFL LUE Coordination: decreased gross motor   Lower Extremity Assessment Lower Extremity Assessment: Overall WFL for tasks assessed   Cervical / Trunk Assessment Cervical / Trunk Assessment: Normal   Communication Communication Communication: No difficulties   Cognition Arousal/Alertness: Awake/alert Behavior During Therapy: WFL for tasks assessed/performed Overall Cognitive Status: Within Functional Limits for tasks assessed  Memory: Decreased short-term memory;Decreased recall of precautions (? effect of medication)             General Comments   Appears easily distracted which appears to be her baseline    Exercises Exercises: Shoulder     Shoulder Instructions Shoulder  Instructions Donning/doffing shirt without moving shoulder: Moderate assistance Method for sponge bathing under operated UE: Moderate assistance Donning/doffing sling/immobilizer: Maximal assistance Correct positioning of sling/immobilizer: Minimal assistance ROM for elbow, wrist and digits of operated UE: Minimal assistance Sling wearing schedule (on at all times/off for ADL's): Maximal assistance Proper positioning of operated UE when showering: Minimal assistance Positioning of UE while sleeping: Minimal assistance    Home Living Family/patient expects to be discharged to:: Private residence Living Arrangements: Alone Available Help at Discharge: Family;Friend(s);Available 24 hours/day (for a few days) Type of Home: House       Home Layout: One level     Bathroom Shower/Tub: Tub/shower unit Shower/tub characteristics: Architectural technologist: Standard Bathroom Accessibility: Yes How Accessible: Accessible via walker Home Equipment: None          Prior Functioning/Environment Level of Independence: Independent             OT Diagnosis: Generalized weakness;Acute pain   OT Problem List: Decreased strength;Decreased range of motion;Impaired balance (sitting and/or standing);Decreased safety awareness;Decreased knowledge of use of DME or AE;Decreased knowledge of precautions;Impaired UE functional use;Pain   OT Treatment/Interventions: Self-care/ADL training;Therapeutic exercise;DME and/or AE instruction;Therapeutic activities;Patient/family education    OT Goals(Current goals can be found in the care plan section) Acute Rehab OT Goals Patient Stated Goal: to get back to work OT Goal Formulation: With patient Time For Goal Achievement: 10/01/14 Potential to Achieve Goals: Good  OT Frequency: Min 2X/week    End of Session Equipment Utilized During Treatment: Other (comment) (sling) Nurse Communication: Mobility status;Precautions;Weight bearing status  Activity  Tolerance: Patient tolerated treatment well Patient left: in chair;with call bell/phone within reach;with family/visitor present   Time: 1040-1100 OT Time Calculation (min): 20 min Charges:  OT General Charges $OT Visit: 1 Procedure OT Evaluation $Initial OT Evaluation Tier I: 1 Procedure G-Codes: OT G-codes **NOT FOR INPATIENT CLASS** Functional Assessment Tool Used: clinical judgement Functional Limitation: Self care Self Care Current Status (G4010): At least 60 percent but less than 80 percent impaired, limited or restricted Self Care Goal Status (U7253): At least 20 percent but less than 40 percent impaired, limited or restricted  Desiree Fleming,HILLARY 09/24/2014, 1:00 PM   [facility], OTR/L  [phone] 09/24/2014

## 2014-09-24 NOTE — Progress Notes (Signed)
Patient provided with discharge instructions and follow up information. She is going home at this time with no further needs. Patient going home with family and friend support.

## 2014-09-24 NOTE — Progress Notes (Signed)
Patient ready to d/c home with a friend. Patient education completed, reviewed d/c instructions including Rx's for pain and muscle spasms/ anxiety. LUE secured in sling and wheeled off the floor in a w/c with a NT.

## 2014-09-24 NOTE — Op Note (Signed)
NAMESAMMANTHA, Allison Chavez NO.:  [id number]  MEDICAL RECORD NO.:  [id number]  LOCATION:  [id number]                        FACILITY:  [facility]  PHYSICIAN:  Metta Clines. Lonnell Chaput, M.D.  DATE OF BIRTH:  February 11, 1941  DATE OF PROCEDURE:  09/23/2014 DATE OF DISCHARGE:                              OPERATIVE REPORT   PREOPERATIVE DIAGNOSIS:  Left shoulder two-part fracture dislocation.  POSTOPERATIVE DIAGNOSIS:  Left shoulder two-part fracture dislocation.  PROCEDURE:  Open reduction and internal fixation of left shoulder two- part fracture dislocation.  SURGEON:  Metta Clines. Dijon Kohlman, M.D.  Terrence DupontOlivia Mackie A. Shuford, PA-C.  ANESTHESIA:  General endotracheal as well as an interscalene block.  ESTIMATED BLOOD LOSS:  Approximately 50 mL.  DRAINS:  None.  HISTORY:  Allison Chavez is a 74 year old female, who fell yesterday onto the outstretched left upper extremity sustaining a fracture dislocation. She was initially taken to the [facility] Room where radiographs confirmed the anterior inferior dislocation with displacement of the greater tuberosity fracture segment.  Reduction attempts performed x2 with unsuccessful attempts at reduction. Subsequently, admitted overnight for observation.  CT scan was completed, and I was contacted.  I reviewed the diagnostic studies, which confirmed an anterior inferior dislocation with a significant bony fragment off the greater tuberosity.  At this time, Allison Chavez is brought to the operating room for planned closed versus open reduction and internal fixation.  Preoperatively, I counseled Allison Chavez regarding treatment options as well as risks versus benefits thereof.  Possible surgical complications were all reviewed including potential for bleeding, infection, neurovascular injury, malunion, nonunion, loss of fixation, recurrence of instability, anesthetic complications, and possible need for additional surgery.  She understands and accepts  and agrees with our planned procedure.  PROCEDURE IN DETAIL:  After undergoing routine preop evaluation, the patient received prophylactic antibiotics and an interscalene block was established in the holding area by the Anesthesia Department.  Placed supine on the operating table, underwent smooth induction of a general endotracheal anesthesia.  At this point, fluoroscopic imaging was then utilized to confirm that the shoulder remained dislocated.  I performed a gentle reduction maneuver and with the patient chemically paralyzed, we were able to affect a successful closed reduction.  There was a large greater tuberosity fracture fragment maintaining a displaced position. At this point, the left shoulder girdle region was sterilely prepped and draped in standard fashion.  Time-out was called.  I made a lateral deltoid splitting incision off the lateral margin of the acromion approximately 5 cm in length.  Skin flaps were elevated and electrocautery was used for hemostasis.  Dissection carried deeply with the deltoid split along the line of its fibers allowing access to the subacromial/subdeltoid bursal space.  We cleaned and removed excess bursal tissue to allow visualization of the tuberosity fracture, which had displaced slightly in a posterior superior position.  This was manually reduced.  Fluoroscopic images were then obtained, which showed overall good alignment.  Placed a series of 3 guide pins from the 4.5 cannulated screw set, and these were measured and then a 4.5 screws with washers were then placed x3 traversing the tuberosity fracture fragment. Overall position was  to our satisfaction.  I should note that her bone was extremely porotic and overall fixation was fair, deemed adequate to maintain alignment.  Final fluoroscopic images were then used to confirm that the hardware was in good position and good alignment at the fracture site.  The wound was then irrigated.  Hemostasis  was obtained. The deltoid was split, it was closed with a series of figure-of-eight #1 Vicryl sutures.  2-0 Vicryl was used for subcu layer, intracuticular 3-0 Monocryl for the skin followed by Steri-Strips and dry dressing.  Left arm was placed in sling immobilizer.  The patient was then awakened, extubated, and taken to the recovery room in stable condition.  Jenetta Loges, PA-C. was used as an Environmental consultant throughout this case, essential for help with positioning the patient, positioning the extremity, manipulation of the extremity, retraction, wound closure, and intraoperative decision making.     Metta Clines. Kleber Crean, M.D.     KMS/MEDQ  D:  09/23/2014  T:  09/24/2014  Job:  [id number]

## 2014-09-24 NOTE — Discharge Instructions (Signed)
You may shower on day 3 and get incision wet You may change dressing as needed and if no drainage leave a bandage off No active motion to shoulder You may move your elbow wrist and hand and allow arm to dangle by side a couple times a day for hygiene and to stretch elbow Wear sling

## 2014-09-24 NOTE — Progress Notes (Signed)
Occupational Therapy Treatment Patient Details Name: Allison Chavez MRN: [id number] DOB: 1940-12-20 Today's Date: 09/24/2014    History of present illness s/p fall with L humerus fx/dislocation. Underwent ORIF   OT comments  Completed education. Extensive review of precautions, allowable ROM for L elbow/hand only, conpensatory techniques for ADL. Continue to recommend follow up visit by HHOT/PT to continue care at home and reduce risk of hospital admissions. Nsg notified. Discussed with PA.   Follow Up Recommendations  Supervision/Assistance - 24 hour;Home health OT    Equipment Recommendations  3 in 1 bedside comode    Recommendations for Other Services      Precautions / Restrictions Precautions Precautions: Fall Precaution Comments: keep L arm close to body Required Braces or Orthoses: Sling (at all times with the exception of ADL and exercise) Restrictions LUE Weight Bearing: Non weight bearing       Mobility  Transfers       General transfer comment: recommended family closely S mobility at this time. Encouraged pt to use furniture walking technique    Balance Overall balance assessment: Needs assistance           Standing balance-Leahy Scale: Fair Standing balance comment: using furntiure to balance herself                   ADL See shoulder section below. Recommended pt use 3 in 1 for shower to reduce risk of falls. Also recommended for pt to have grab bars installed in shower. Pt having difficulty donning underwear and pants. Demonstrated use of reacher for LB ADL to increase independence and safety with these tasks and with retrieving items from floor due to risk of falls. Educated to wear sling at all times. Demonstrated multiple times technique to donn/doff sling. Pt continuing to require assistance with sling management . Friend verbalized understanding of all techniques.                                     Cognition   Behavior  During Therapy: WFL for tasks assessed/performed Overall Cognitive Status: Within Functional Limits for tasks assessed       Memory: Decreased short-term memory;Decreased recall of precautions               Extremity/Trunk Assessment  Upper Extremity Assessment Upper Extremity Assessment: LUE deficits/detail LUE Deficits / Details: no shoulder ROM. Dangle only. elbow AROM limited due to pain. PROM WFL. hand ROM WFL LUE Coordination: decreased gross motor   Lower Extremity Assessment Lower Extremity Assessment: Overall WFL for tasks assessed   Cervical / Trunk Assessment Cervical / Trunk Assessment: Normal    Exercises Shoulder Exercises - dangle only during ADL Elbow Flexion: AAROM;Left;10 reps;Seated Elbow Extension: AAROM;Left;10 reps;Seated Wrist Flexion: AROM;Left;10 reps Wrist Extension: AROM;Left;10 reps Digit Composite Flexion: AROM;Left;10 reps Composite Extension: AROM;Left;10 reps Donning/doffing shirt without moving shoulder: Supervision/safety;Caregiver independent with task (caregiver verbalized understanding) Method for sponge bathing under operated UE: Supervision/safety Donning/doffing sling/immobilizer: Caregiver independent with task Correct positioning of sling/immobilizer: Caregiver independent with task ROM for elbow, wrist and digits of operated UE: Caregiver independent with task Sling wearing schedule (on at all times/off for ADL's): Caregiver independent with task Proper positioning of operated UE when showering: Supervision/safety Positioning of UE while sleeping: Modified independent   Shoulder Instructions Shoulder Instructions Donning/doffing shirt without moving shoulder: Supervision/safety;Caregiver independent with task (caregiver verbalized understanding) Method for sponge bathing under operated UE: Supervision/safety  Donning/doffing sling/immobilizer: Caregiver independent with task Correct positioning of sling/immobilizer: Caregiver  independent with task ROM for elbow, wrist and digits of operated UE: Caregiver independent with task Sling wearing schedule (on at all times/off for ADL's): Caregiver independent with task Proper positioning of operated UE when showering: Supervision/safety Positioning of UE while sleeping: Modified independent     General Comments      Pertinent Vitals/ Pain       Pain Assessment: 0-10 Pain Score: 4  Pain Location: L shoulder Pain Descriptors / Indicators: Aching Pain Intervention(s): Limited activity within patient's tolerance;Monitored during session;Repositioned;Ice applied  Home Living Family/patient expects to be discharged to:: Private residence Living Arrangements: Alone Available Help at Discharge: Family;Friend(s);Available 24 hours/day (for a few days) Type of Home: House       Home Layout: One level     Bathroom Shower/Tub: Tub/shower unit Shower/tub characteristics: Architectural technologist: Standard Bathroom Accessibility: Yes How Accessible: Accessible via walker Home Equipment: None          Prior Functioning/Environment Level of Independence: Independent            Frequency Min 2X/week     Progress Toward Goals  OT Goals(current goals can now be found in the care plan section)  Progress towards OT goals: Goals met/education completed, patient discharged from OT  Acute Rehab OT Goals Patient Stated Goal: to get back to work OT Goal Formulation: With patient Time For Goal Achievement: 10/01/14 Potential to Achieve Goals: Good ADL Goals Pt/caregiver will Perform Home Exercise Program: With written HEP provided;With Supervision (with family/friend assisting) Additional ADL Goal #1: Pt/friend will verblaize understanding of precautions for LUE and compensatory techniques for ADL  Plan Discharge plan remains appropriate    Co-evaluation                 End of Session Equipment Utilized During Treatment: Other (comment)   Activity  Tolerance Patient tolerated treatment well   Patient Left in chair;with call bell/phone within reach;with family/visitor present   Nurse Communication Mobility status;Precautions;Weight bearing status    Functional Assessment Tool Used: clinical judgement Functional Limitation: Self care Self Care Current Status (T0177): At least 20 percent but less than 40 percent impaired, limited or restricted Self Care Goal Status (L3903): At least 20 percent but less than 40 percent impaired, limited or restricted   Time: 1115-1159 OT Time Calculation (min): 44 min  Charges: OT G-codes **NOT FOR INPATIENT CLASS** Functional Assessment Tool Used: clinical judgement Functional Limitation: Self care Self Care Current Status (E0923): At least 20 percent but less than 40 percent impaired, limited or restricted Self Care Goal Status (R0076): At least 20 percent but less than 40 percent impaired, limited or restricted OT General Charges $OT Visit: 1 Procedure OT Evaluation $Initial OT Evaluation Tier I: 1 Procedure OT Treatments $Self Care/Home Management : 23-37 mins $Therapeutic Activity: 8-22 mins  ,HILLARY 09/24/2014, 1:14 PM   [facility], OTR/L  [phone] 09/24/2014

## 2014-09-24 NOTE — Care Management Note (Signed)
    Page 1 of 1   09/24/2014     3:03:21 PM CARE MANAGEMENT NOTE 09/24/2014  Patient:  KRYSTYN, PICKING   Account Number:  [id number]  Date Initiated:  09/24/2014  Documentation initiated by:  Lorne Skeens  Subjective/Objective Assessment:   Patient was admitted for ORIF left shoulder.     Action/Plan:   Will follow for discharge needs.   Anticipated DC Date:  09/24/2014   Anticipated DC Plan:  [place]  CM consult      Choice offered to / List presented to:             Status of service:   Medicare Important Message given?  NA - LOS <3 / Initial given by admissions (If response is "NO", the following Medicare IM given date fields will be blank) Date Medicare IM given:   Medicare IM given by:   Date Additional Medicare IM given:   Additional Medicare IM given by:    Discharge Disposition:  [place]  Per UR Regulation:  Reviewed for med. necessity/level of care/duration of stay  If discussed at [place] of Stay Meetings, dates discussed:    Comments:  09/24/14  Lorne Skeens RN, MSN, CM- Attempted to speak with patient regarding home health needs.  Per bedside RN, patient declined to wait for CM prior to discharging.  CM attempted to reach patient at the phone number listed in the chart to make [facility] arrangements.  Voicemail left. Awaiting return call.

## 2014-09-27 ENCOUNTER — Encounter (HOSPITAL_COMMUNITY): Payer: Self-pay | Admitting: Orthopedic Surgery

## 2014-09-27 DIAGNOSIS — S42222A 2-part displaced fracture of surgical neck of left humerus, initial encounter for closed fracture: Secondary | ICD-10-CM | POA: Diagnosis not present

## 2014-09-27 NOTE — Care Management Note (Signed)
CARE MANAGEMENT NOTE 09/27/2014  Patient:  Allison Chavez, Allison Chavez   Account Number:  [id number]  Date Initiated:  09/24/2014  Documentation initiated by:  Lorne Skeens  Subjective/Objective Assessment:   Patient was admitted for ORIF left shoulder.     Action/Plan:   Will follow for discharge needs.   Anticipated DC Date:  09/24/2014   Anticipated DC Plan:  [place]  CM consult      [facility] Choice  HOME HEALTH   Choice offered to / List presented to:  C-1 Patient        Mobile arranged  [place]   Status of service:  Completed, signed off Medicare Important Message given?  NA - LOS <3 / Initial given by admissions (If response is "NO", the following Medicare IM given date fields will be blank) Date Medicare IM given:   Medicare IM given by:   Date Additional Medicare IM given:   Additional Medicare IM given by:    Discharge Disposition:  [place]  Per UR Regulation:  Reviewed for med. necessity/level of care/duration of stay  If discussed at [place] of Stay Meetings, dates discussed:    Comments:  09/27/14  11:54am  Ricki Miller, RN BSN Case Manager CM spoke with Patient concerning choice for home health agency. [facility] selected. Referral called to Christa See, [place] liaison. She will followup with patient.

## 2014-09-28 DIAGNOSIS — F329 Major depressive disorder, single episode, unspecified: Secondary | ICD-10-CM | POA: Diagnosis not present

## 2014-09-28 DIAGNOSIS — Z472 Encounter for removal of internal fixation device: Secondary | ICD-10-CM | POA: Diagnosis not present

## 2014-09-28 DIAGNOSIS — F419 Anxiety disorder, unspecified: Secondary | ICD-10-CM | POA: Diagnosis not present

## 2014-09-28 DIAGNOSIS — I10 Essential (primary) hypertension: Secondary | ICD-10-CM | POA: Diagnosis not present

## 2014-09-28 DIAGNOSIS — S43005A Unspecified dislocation of left shoulder joint, initial encounter: Secondary | ICD-10-CM | POA: Diagnosis not present

## 2014-10-01 DIAGNOSIS — S43005A Unspecified dislocation of left shoulder joint, initial encounter: Secondary | ICD-10-CM | POA: Diagnosis not present

## 2014-10-01 DIAGNOSIS — F329 Major depressive disorder, single episode, unspecified: Secondary | ICD-10-CM | POA: Diagnosis not present

## 2014-10-01 DIAGNOSIS — Z472 Encounter for removal of internal fixation device: Secondary | ICD-10-CM | POA: Diagnosis not present

## 2014-10-01 DIAGNOSIS — I10 Essential (primary) hypertension: Secondary | ICD-10-CM | POA: Diagnosis not present

## 2014-10-01 DIAGNOSIS — F419 Anxiety disorder, unspecified: Secondary | ICD-10-CM | POA: Diagnosis not present

## 2014-10-04 DIAGNOSIS — F329 Major depressive disorder, single episode, unspecified: Secondary | ICD-10-CM | POA: Diagnosis not present

## 2014-10-04 DIAGNOSIS — Z472 Encounter for removal of internal fixation device: Secondary | ICD-10-CM | POA: Diagnosis not present

## 2014-10-04 DIAGNOSIS — F419 Anxiety disorder, unspecified: Secondary | ICD-10-CM | POA: Diagnosis not present

## 2014-10-04 DIAGNOSIS — S43005A Unspecified dislocation of left shoulder joint, initial encounter: Secondary | ICD-10-CM | POA: Diagnosis not present

## 2014-10-04 DIAGNOSIS — I10 Essential (primary) hypertension: Secondary | ICD-10-CM | POA: Diagnosis not present

## 2014-10-05 DIAGNOSIS — S43005A Unspecified dislocation of left shoulder joint, initial encounter: Secondary | ICD-10-CM | POA: Diagnosis not present

## 2014-10-05 DIAGNOSIS — I10 Essential (primary) hypertension: Secondary | ICD-10-CM | POA: Diagnosis not present

## 2014-10-05 DIAGNOSIS — F329 Major depressive disorder, single episode, unspecified: Secondary | ICD-10-CM | POA: Diagnosis not present

## 2014-10-05 DIAGNOSIS — Z472 Encounter for removal of internal fixation device: Secondary | ICD-10-CM | POA: Diagnosis not present

## 2014-10-05 DIAGNOSIS — F419 Anxiety disorder, unspecified: Secondary | ICD-10-CM | POA: Diagnosis not present

## 2014-10-06 DIAGNOSIS — S42252D Displaced fracture of greater tuberosity of left humerus, subsequent encounter for fracture with routine healing: Secondary | ICD-10-CM | POA: Diagnosis not present

## 2014-10-06 DIAGNOSIS — S43005D Unspecified dislocation of left shoulder joint, subsequent encounter: Secondary | ICD-10-CM | POA: Diagnosis not present

## 2014-10-06 DIAGNOSIS — S42302D Unspecified fracture of shaft of humerus, left arm, subsequent encounter for fracture with routine healing: Secondary | ICD-10-CM | POA: Diagnosis not present

## 2014-10-08 ENCOUNTER — Other Ambulatory Visit: Payer: Self-pay | Admitting: Family Medicine

## 2014-10-08 DIAGNOSIS — F329 Major depressive disorder, single episode, unspecified: Secondary | ICD-10-CM | POA: Diagnosis not present

## 2014-10-08 DIAGNOSIS — Z472 Encounter for removal of internal fixation device: Secondary | ICD-10-CM | POA: Diagnosis not present

## 2014-10-08 DIAGNOSIS — S43005A Unspecified dislocation of left shoulder joint, initial encounter: Secondary | ICD-10-CM | POA: Diagnosis not present

## 2014-10-08 DIAGNOSIS — F419 Anxiety disorder, unspecified: Secondary | ICD-10-CM | POA: Diagnosis not present

## 2014-10-08 DIAGNOSIS — I10 Essential (primary) hypertension: Secondary | ICD-10-CM | POA: Diagnosis not present

## 2014-10-13 ENCOUNTER — Ambulatory Visit: Payer: Medicare Other | Admitting: Family Medicine

## 2014-10-29 ENCOUNTER — Other Ambulatory Visit: Payer: Self-pay | Admitting: Family Medicine

## 2014-10-29 NOTE — Telephone Encounter (Signed)
Clorazepate 7.5 mg- refill phoned to pt's pharmacy (#30 w/ 2 RFs).

## 2014-11-03 DIAGNOSIS — S42302D Unspecified fracture of shaft of humerus, left arm, subsequent encounter for fracture with routine healing: Secondary | ICD-10-CM | POA: Diagnosis not present

## 2014-11-10 ENCOUNTER — Other Ambulatory Visit: Payer: Self-pay | Admitting: Family Medicine

## 2014-12-06 DIAGNOSIS — S42302D Unspecified fracture of shaft of humerus, left arm, subsequent encounter for fracture with routine healing: Secondary | ICD-10-CM | POA: Diagnosis not present

## 2014-12-08 ENCOUNTER — Encounter (HOSPITAL_COMMUNITY): Payer: Self-pay | Admitting: *Deleted

## 2014-12-08 MED ORDER — CEFAZOLIN SODIUM-DEXTROSE 2-3 GM-% IV SOLR
2.0000 g | INTRAVENOUS | Status: AC
  Administered 2014-12-09: 2 g via INTRAVENOUS
  Filled 2014-12-08: qty 50

## 2014-12-08 MED ORDER — LACTATED RINGERS IV SOLN
INTRAVENOUS | Status: DC
  Administered 2014-12-09: 09:00:00 via INTRAVENOUS

## 2014-12-09 ENCOUNTER — Ambulatory Visit (HOSPITAL_COMMUNITY): Payer: Medicare Other | Admitting: Anesthesiology

## 2014-12-09 ENCOUNTER — Ambulatory Visit (HOSPITAL_COMMUNITY)
Admission: RE | Admit: 2014-12-09 | Discharge: 2014-12-09 | Disposition: A | Discharge status: Home / Self Care | Payer: Medicare Other | Source: Ambulatory Visit | Attending: Orthopedic Surgery | Admitting: Orthopedic Surgery

## 2014-12-09 ENCOUNTER — Encounter (HOSPITAL_COMMUNITY)
Admission: RE | Disposition: A | Discharge status: Home / Self Care | Payer: Self-pay | Source: Ambulatory Visit | Attending: Orthopedic Surgery

## 2014-12-09 ENCOUNTER — Encounter (HOSPITAL_COMMUNITY): Payer: Self-pay | Admitting: *Deleted

## 2014-12-09 DIAGNOSIS — R001 Bradycardia, unspecified: Secondary | ICD-10-CM | POA: Diagnosis not present

## 2014-12-09 DIAGNOSIS — N289 Disorder of kidney and ureter, unspecified: Secondary | ICD-10-CM | POA: Insufficient documentation

## 2014-12-09 DIAGNOSIS — M7502 Adhesive capsulitis of left shoulder: Secondary | ICD-10-CM | POA: Diagnosis not present

## 2014-12-09 DIAGNOSIS — Z79899 Other long term (current) drug therapy: Secondary | ICD-10-CM | POA: Diagnosis not present

## 2014-12-09 DIAGNOSIS — Y838 Other surgical procedures as the cause of abnormal reaction of the patient, or of later complication, without mention of misadventure at the time of the procedure: Secondary | ICD-10-CM | POA: Insufficient documentation

## 2014-12-09 DIAGNOSIS — Z7952 Long term (current) use of systemic steroids: Secondary | ICD-10-CM | POA: Insufficient documentation

## 2014-12-09 DIAGNOSIS — Z791 Long term (current) use of non-steroidal anti-inflammatories (NSAID): Secondary | ICD-10-CM | POA: Diagnosis not present

## 2014-12-09 DIAGNOSIS — M19011 Primary osteoarthritis, right shoulder: Secondary | ICD-10-CM | POA: Diagnosis not present

## 2014-12-09 DIAGNOSIS — M47892 Other spondylosis, cervical region: Secondary | ICD-10-CM | POA: Diagnosis not present

## 2014-12-09 DIAGNOSIS — Z87891 Personal history of nicotine dependence: Secondary | ICD-10-CM | POA: Diagnosis not present

## 2014-12-09 DIAGNOSIS — F329 Major depressive disorder, single episode, unspecified: Secondary | ICD-10-CM | POA: Insufficient documentation

## 2014-12-09 DIAGNOSIS — Z7982 Long term (current) use of aspirin: Secondary | ICD-10-CM | POA: Insufficient documentation

## 2014-12-09 DIAGNOSIS — I10 Essential (primary) hypertension: Secondary | ICD-10-CM | POA: Insufficient documentation

## 2014-12-09 DIAGNOSIS — T8484XA Pain due to internal orthopedic prosthetic devices, implants and grafts, initial encounter: Secondary | ICD-10-CM | POA: Diagnosis not present

## 2014-12-09 DIAGNOSIS — Y929 Unspecified place or not applicable: Secondary | ICD-10-CM | POA: Insufficient documentation

## 2014-12-09 DIAGNOSIS — F419 Anxiety disorder, unspecified: Secondary | ICD-10-CM | POA: Insufficient documentation

## 2014-12-09 HISTORY — DX: Pneumonia, unspecified organism: J18.9

## 2014-12-09 HISTORY — PX: HARDWARE REMOVAL: SHX979

## 2014-12-09 LAB — COMPREHENSIVE METABOLIC PANEL
ALT: 15 U/L (ref 14–54)
ANION GAP: 9 (ref 5–15)
AST: 36 U/L (ref 15–41)
Albumin: 3.5 g/dL (ref 3.5–5.0)
Alkaline Phosphatase: 71 U/L (ref 38–126)
BUN: 16 mg/dL (ref 6–20)
CALCIUM: 8.9 mg/dL (ref 8.9–10.3)
CHLORIDE: 97 mmol/L — AB (ref 101–111)
CO2: 26 mmol/L (ref 22–32)
Creatinine, Ser: 1.17 mg/dL — ABNORMAL HIGH (ref 0.44–1.00)
GFR calc Af Amer: 53 mL/min — ABNORMAL LOW (ref >60)
GFR, EST NON AFRICAN AMERICAN: 45 mL/min — AB (ref >60)
GLUCOSE: 82 mg/dL (ref 65–99)
Potassium: 4.5 mmol/L (ref 3.5–5.1)
SODIUM: 132 mmol/L — AB (ref 135–145)
Total Bilirubin: 0.7 mg/dL (ref 0.3–1.2)
Total Protein: 6.7 g/dL (ref 6.5–8.1)

## 2014-12-09 LAB — CBC WITH DIFFERENTIAL/PLATELET
BASOS ABS: 0.1 10*3/uL (ref 0.0–0.1)
BASOS PCT: 1 % (ref 0–1)
Eosinophils Absolute: 0.4 10*3/uL (ref 0.0–0.7)
Eosinophils Relative: 7 % — ABNORMAL HIGH (ref 0–5)
HCT: 33.2 % — ABNORMAL LOW (ref 36.0–46.0)
Hemoglobin: 11.4 g/dL — ABNORMAL LOW (ref 12.0–15.0)
Lymphocytes Relative: 35 % (ref 12–46)
Lymphs Abs: 1.9 10*3/uL (ref 0.7–4.0)
MCH: 30.8 pg (ref 26.0–34.0)
MCHC: 34.3 g/dL (ref 30.0–36.0)
MCV: 89.7 fL (ref 78.0–100.0)
Monocytes Absolute: 0.5 10*3/uL (ref 0.1–1.0)
Monocytes Relative: 10 % (ref 3–12)
NEUTROS PCT: 47 % (ref 43–77)
Neutro Abs: 2.6 10*3/uL (ref 1.7–7.7)
PLATELETS: 251 10*3/uL (ref 150–400)
RBC: 3.7 MIL/uL — AB (ref 3.87–5.11)
RDW: 13.4 % (ref 11.5–15.5)
WBC: 5.5 10*3/uL (ref 4.0–10.5)

## 2014-12-09 LAB — PROTIME-INR
INR: 1.05 (ref 0.00–1.49)
Prothrombin Time: 13.9 seconds (ref 11.6–15.2)

## 2014-12-09 LAB — APTT: APTT: 25 s (ref 24–37)

## 2014-12-09 SURGERY — REMOVAL, HARDWARE
Anesthesia: General | Site: Shoulder | Laterality: Left

## 2014-12-09 MED ORDER — MIDAZOLAM HCL 2 MG/2ML IJ SOLN
INTRAMUSCULAR | Status: AC
  Filled 2014-12-09: qty 2

## 2014-12-09 MED ORDER — LIDOCAINE HCL (CARDIAC) 20 MG/ML IV SOLN
INTRAVENOUS | Status: AC
  Filled 2014-12-09: qty 5

## 2014-12-09 MED ORDER — BUPIVACAINE LIPOSOME 1.3 % IJ SUSP
20.0000 mL | INTRAMUSCULAR | Status: DC
  Filled 2014-12-09: qty 20

## 2014-12-09 MED ORDER — LACTATED RINGERS IV SOLN
INTRAVENOUS | Status: DC | PRN
  Administered 2014-12-09: 10:00:00 via INTRAVENOUS

## 2014-12-09 MED ORDER — DIAZEPAM 5 MG PO TABS
5.0000 mg | ORAL_TABLET | Freq: Once | ORAL | Status: AC
  Administered 2014-12-09: 5 mg via ORAL

## 2014-12-09 MED ORDER — DEXAMETHASONE SODIUM PHOSPHATE 4 MG/ML IJ SOLN
INTRAMUSCULAR | Status: DC | PRN
  Administered 2014-12-09: 4 mg via INTRAVENOUS

## 2014-12-09 MED ORDER — OXYCODONE HCL 5 MG PO TABS
ORAL_TABLET | ORAL | Status: AC
  Filled 2014-12-09: qty 1

## 2014-12-09 MED ORDER — PHENYLEPHRINE HCL 10 MG/ML IJ SOLN
10.0000 mg | INTRAVENOUS | Status: DC | PRN
  Administered 2014-12-09: 20 ug/min via INTRAVENOUS

## 2014-12-09 MED ORDER — ROCURONIUM BROMIDE 100 MG/10ML IV SOLN
INTRAVENOUS | Status: DC | PRN
  Administered 2014-12-09: 30 mg via INTRAVENOUS

## 2014-12-09 MED ORDER — ONDANSETRON HCL 4 MG/2ML IJ SOLN
INTRAMUSCULAR | Status: DC | PRN
Start: 2014-12-09 — End: 2014-12-09
  Administered 2014-12-09: 4 mg via INTRAVENOUS

## 2014-12-09 MED ORDER — ARTIFICIAL TEARS OP OINT
TOPICAL_OINTMENT | OPHTHALMIC | Status: DC | PRN
  Administered 2014-12-09: 1 via OPHTHALMIC

## 2014-12-09 MED ORDER — 0.9 % SODIUM CHLORIDE (POUR BTL) OPTIME
TOPICAL | Status: DC | PRN
  Administered 2014-12-09: 1000 mL

## 2014-12-09 MED ORDER — MIDAZOLAM HCL 5 MG/ML IJ SOLN: 2.0000 mg | Freq: Once | INTRAMUSCULAR | Status: DC

## 2014-12-09 MED ORDER — BUPIVACAINE HCL (PF) 0.25 % IJ SOLN
INTRAMUSCULAR | Status: DC | PRN
  Administered 2014-12-09: 10 mL

## 2014-12-09 MED ORDER — ARTIFICIAL TEARS OP OINT
TOPICAL_OINTMENT | OPHTHALMIC | Status: AC
  Filled 2014-12-09: qty 3.5

## 2014-12-09 MED ORDER — DIAZEPAM 5 MG PO TABS: 2.5000 mg | ORAL_TABLET | Freq: Four times a day (QID) | ORAL | Status: DC | PRN

## 2014-12-09 MED ORDER — BUPIVACAINE HCL (PF) 0.25 % IJ SOLN
INTRAMUSCULAR | Status: AC
  Filled 2014-12-09: qty 30

## 2014-12-09 MED ORDER — FENTANYL CITRATE (PF) 250 MCG/5ML IJ SOLN
INTRAMUSCULAR | Status: AC
  Filled 2014-12-09: qty 5

## 2014-12-09 MED ORDER — ROCURONIUM BROMIDE 50 MG/5ML IV SOLN
INTRAVENOUS | Status: AC
  Filled 2014-12-09: qty 1

## 2014-12-09 MED ORDER — FENTANYL CITRATE (PF) 100 MCG/2ML IJ SOLN
25.0000 ug | INTRAMUSCULAR | Status: DC | PRN
  Administered 2014-12-09: 50 ug via INTRAVENOUS
  Administered 2014-12-09 (×2): 25 ug via INTRAVENOUS

## 2014-12-09 MED ORDER — LIDOCAINE HCL (CARDIAC) 20 MG/ML IV SOLN
INTRAVENOUS | Status: DC | PRN
  Administered 2014-12-09: 40 mg via INTRAVENOUS

## 2014-12-09 MED ORDER — DIAZEPAM 5 MG PO TABS
ORAL_TABLET | ORAL | Status: AC
  Filled 2014-12-09: qty 1

## 2014-12-09 MED ORDER — DEXAMETHASONE SODIUM PHOSPHATE 4 MG/ML IJ SOLN
INTRAMUSCULAR | Status: AC
  Filled 2014-12-09: qty 1

## 2014-12-09 MED ORDER — PROPOFOL 10 MG/ML IV BOLUS
INTRAVENOUS | Status: DC | PRN
  Administered 2014-12-09: 110 mg via INTRAVENOUS

## 2014-12-09 MED ORDER — FENTANYL CITRATE (PF) 100 MCG/2ML IJ SOLN
INTRAMUSCULAR | Status: AC
  Filled 2014-12-09: qty 2

## 2014-12-09 MED ORDER — FENTANYL CITRATE (PF) 100 MCG/2ML IJ SOLN
INTRAMUSCULAR | Status: DC | PRN
  Administered 2014-12-09 (×2): 50 ug via INTRAVENOUS

## 2014-12-09 MED ORDER — NEOSTIGMINE METHYLSULFATE 10 MG/10ML IV SOLN
INTRAVENOUS | Status: DC | PRN
  Administered 2014-12-09: 3 mg via INTRAVENOUS

## 2014-12-09 MED ORDER — OXYCODONE HCL 5 MG PO TABS
5.0000 mg | ORAL_TABLET | Freq: Once | ORAL | Status: AC
  Administered 2014-12-09: 5 mg via ORAL

## 2014-12-09 MED ORDER — CHLORHEXIDINE GLUCONATE 4 % EX LIQD: 60.0000 mL | Freq: Once | CUTANEOUS | Status: DC

## 2014-12-09 MED ORDER — PROPOFOL 10 MG/ML IV BOLUS
INTRAVENOUS | Status: AC
  Filled 2014-12-09: qty 20

## 2014-12-09 MED ORDER — OXYCODONE HCL 5 MG PO TABS: 5.0000 mg | ORAL_TABLET | ORAL | Status: DC | PRN

## 2014-12-09 MED ORDER — ONDANSETRON HCL 4 MG/2ML IJ SOLN
INTRAMUSCULAR | Status: AC
  Filled 2014-12-09: qty 2

## 2014-12-09 MED ORDER — BUPIVACAINE LIPOSOME 1.3 % IJ SUSP
INTRAMUSCULAR | Status: DC | PRN
  Administered 2014-12-09: 20 mL

## 2014-12-09 MED ORDER — FENTANYL CITRATE (PF) 100 MCG/2ML IJ SOLN: 100.0000 ug | Freq: Once | INTRAMUSCULAR | Status: DC

## 2014-12-09 MED ORDER — GLYCOPYRROLATE 0.2 MG/ML IJ SOLN
INTRAMUSCULAR | Status: DC | PRN
  Administered 2014-12-09: 0.4 mg via INTRAVENOUS
  Administered 2014-12-09: 0.2 mg via INTRAVENOUS

## 2014-12-09 SURGICAL SUPPLY — 55 items
BANDAGE ELASTIC 4 VELCRO ST LF (GAUZE/BANDAGES/DRESSINGS) IMPLANT
BANDAGE ELASTIC 6 VELCRO ST LF (GAUZE/BANDAGES/DRESSINGS) IMPLANT
BANDAGE ESMARK 6X9 LF (GAUZE/BANDAGES/DRESSINGS) IMPLANT
BNDG COHESIVE 4X5 TAN STRL (GAUZE/BANDAGES/DRESSINGS) IMPLANT
BNDG ESMARK 6X9 LF (GAUZE/BANDAGES/DRESSINGS)
BNDG GAUZE ELAST 4 BULKY (GAUZE/BANDAGES/DRESSINGS) ×3 IMPLANT
CLOSURE WOUND 1/2 X4 (GAUZE/BANDAGES/DRESSINGS)
COVER SURGICAL LIGHT HANDLE (MISCELLANEOUS) ×3 IMPLANT
CUFF TOURNIQUET SINGLE 18IN (TOURNIQUET CUFF) ×3 IMPLANT
CUFF TOURNIQUET SINGLE 24IN (TOURNIQUET CUFF) IMPLANT
DERMABOND ADHESIVE PROPEN (GAUZE/BANDAGES/DRESSINGS) ×2
DERMABOND ADVANCED .7 DNX6 (GAUZE/BANDAGES/DRESSINGS) ×1 IMPLANT
DRAPE C-ARM 42X72 X-RAY (DRAPES) IMPLANT
DRAPE INCISE IOBAN 66X45 STRL (DRAPES) IMPLANT
DRAPE ORTHO SPLIT 77X108 STRL (DRAPES) ×4
DRAPE PROXIMA HALF (DRAPES) IMPLANT
DRAPE SURG ORHT 6 SPLT 77X108 (DRAPES) ×2 IMPLANT
DRSG EMULSION OIL 3X3 NADH (GAUZE/BANDAGES/DRESSINGS) ×3 IMPLANT
DRSG MEPILEX BORDER 4X4 (GAUZE/BANDAGES/DRESSINGS) ×3 IMPLANT
DRSG PAD ABDOMINAL 8X10 ST (GAUZE/BANDAGES/DRESSINGS) ×3 IMPLANT
ELECT REM PT RETURN 9FT ADLT (ELECTROSURGICAL) ×3
ELECTRODE REM PT RTRN 9FT ADLT (ELECTROSURGICAL) ×1 IMPLANT
GAUZE SPONGE 4X4 12PLY STRL (GAUZE/BANDAGES/DRESSINGS) ×3 IMPLANT
GLOVE BIO SURGEON STRL SZ7.5 (GLOVE) ×3 IMPLANT
GLOVE BIO SURGEON STRL SZ8 (GLOVE) ×3 IMPLANT
GLOVE BIOGEL PI IND STRL 6.5 (GLOVE) ×1 IMPLANT
GLOVE BIOGEL PI INDICATOR 6.5 (GLOVE) ×2
GLOVE EUDERMIC 7 POWDERFREE (GLOVE) ×3 IMPLANT
GLOVE SS BIOGEL STRL SZ 7.5 (GLOVE) ×1 IMPLANT
GLOVE SUPERSENSE BIOGEL SZ 7.5 (GLOVE) ×2
GLOVE SURG SS PI 6.0 STRL IVOR (GLOVE) ×3 IMPLANT
GOWN STRL REUS W/ TWL LRG LVL3 (GOWN DISPOSABLE) ×1 IMPLANT
GOWN STRL REUS W/ TWL XL LVL3 (GOWN DISPOSABLE) ×2 IMPLANT
GOWN STRL REUS W/TWL LRG LVL3 (GOWN DISPOSABLE) ×2
GOWN STRL REUS W/TWL XL LVL3 (GOWN DISPOSABLE) ×4
KIT BASIN OR (CUSTOM PROCEDURE TRAY) ×3 IMPLANT
KIT ROOM TURNOVER OR (KITS) ×3 IMPLANT
MANIFOLD NEPTUNE II (INSTRUMENTS) ×3 IMPLANT
NS IRRIG 1000ML POUR BTL (IV SOLUTION) ×3 IMPLANT
PACK ORTHO EXTREMITY (CUSTOM PROCEDURE TRAY) ×3 IMPLANT
PAD ARMBOARD 7.5X6 YLW CONV (MISCELLANEOUS) ×6 IMPLANT
PAD CAST 4YDX4 CTTN HI CHSV (CAST SUPPLIES) ×1 IMPLANT
PADDING CAST COTTON 4X4 STRL (CAST SUPPLIES) ×2
STAPLER VISISTAT 35W (STAPLE) IMPLANT
STOCKINETTE IMPERVIOUS 9X36 MD (GAUZE/BANDAGES/DRESSINGS) IMPLANT
STRIP CLOSURE SKIN 1/2X4 (GAUZE/BANDAGES/DRESSINGS) IMPLANT
SUT ETHILON 4 0 FS 1 (SUTURE) IMPLANT
SUT PROLENE 3 0 PS 2 (SUTURE) IMPLANT
SUT VIC AB 0 CT1 27 (SUTURE)
SUT VIC AB 0 CT1 27XBRD ANBCTR (SUTURE) IMPLANT
SUT VIC AB 2-0 CT1 27 (SUTURE)
SUT VIC AB 2-0 CT1 TAPERPNT 27 (SUTURE) IMPLANT
TOWEL OR 17X24 6PK STRL BLUE (TOWEL DISPOSABLE) ×3 IMPLANT
TOWEL OR 17X26 10 PK STRL BLUE (TOWEL DISPOSABLE) ×3 IMPLANT
WATER STERILE IRR 1000ML POUR (IV SOLUTION) ×3 IMPLANT

## 2014-12-09 NOTE — Anesthesia Postprocedure Evaluation (Signed)
  Anesthesia Post-op Note  Patient: Allison Chavez  Procedure(s) Performed: Procedure(s): LEFT SHOULDER HARDWARE REMOVAL (Left)  Patient Location: PACU  Anesthesia Type:General  Level of Consciousness: awake and alert   Airway and Oxygen Therapy: Patient Spontanous Breathing  Post-op Pain: mild  Post-op Assessment: Post-op Vital signs reviewed, Patient's Cardiovascular Status Stable and Respiratory Function Stable  Post-op Vital Signs: Reviewed  Filed Vitals:   12/09/14 1200  BP:   Pulse: 60  Temp:   Resp: 10    Complications: No apparent anesthesia complications

## 2014-12-09 NOTE — Transfer of Care (Signed)
Immediate Anesthesia Transfer of Care Note  Patient: Allison Chavez  Procedure(s) Performed: Procedure(s): LEFT SHOULDER HARDWARE REMOVAL (Left)  Patient Location: PACU  Anesthesia Type:General  Level of Consciousness: awake, alert  and oriented  Airway & Oxygen Therapy: Patient Spontanous Breathing and Patient connected to nasal cannula oxygen  Post-op Assessment: Report given to RN and Post -op Vital signs reviewed and stable  Post vital signs: Reviewed and stable  Last Vitals:  Filed Vitals:   12/09/14 1115  BP:   Pulse: 65  Temp:   Resp: 16    Complications: No apparent anesthesia complications
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
## 2014-12-09 NOTE — Anesthesia Procedure Notes (Signed)
Procedure Name: Intubation Date/Time: 12/09/2014 10:08 AM Performed by: Susa Loffler Pre-anesthesia Checklist: Patient identified, Emergency Drugs available, Suction available, Patient being monitored and Timeout performed Patient Re-evaluated:Patient Re-evaluated prior to inductionOxygen Delivery Method: Circle system utilized Preoxygenation: Pre-oxygenation with 100% oxygen Intubation Type: IV induction Ventilation: Mask ventilation without difficulty Laryngoscope Size: Mac and 3 Grade View: Grade I Tube type: Oral Tube size: 7.0 mm Number of attempts: 1 Airway Equipment and Method: Stylet Placement Confirmation: ETT inserted through vocal cords under direct vision,  positive ETCO2 and breath sounds checked- equal and bilateral Secured at: 21 cm Tube secured with: Tape Dental Injury: Teeth and Oropharynx as per pre-operative assessment

## 2014-12-09 NOTE — Anesthesia Preprocedure Evaluation (Addendum)
Anesthesia Evaluation  Patient identified by MRN, date of birth, ID band Patient awake    Reviewed: Allergy & Precautions, H&P , NPO status , Patient's Chart, lab work & pertinent test results  Airway Mallampati: I  TM Distance: >3 FB Neck ROM: Full    Dental no notable dental hx. (+) Teeth Intact, Dental Advisory Given   Pulmonary neg pulmonary ROS, former smoker,  breath sounds clear to auscultation  Pulmonary exam normal       Cardiovascular hypertension, Pt. on medications Rhythm:Regular Rate:Normal     Neuro/Psych Anxiety Depression negative neurological ROS     GI/Hepatic negative GI ROS, Neg liver ROS,   Endo/Other  negative endocrine ROS  Renal/GU Renal InsufficiencyRenal disease  negative genitourinary   Musculoskeletal  (+) Arthritis -, Osteoarthritis,    Abdominal   Peds  Hematology negative hematology ROS (+)   Anesthesia Other Findings   Reproductive/Obstetrics negative OB ROS                            Anesthesia Physical Anesthesia Plan  ASA: II  Anesthesia Plan: General   Post-op Pain Management:    Induction: Intravenous  Airway Management Planned: Oral ETT  Additional Equipment:   Intra-op Plan:   Post-operative Plan: Extubation in OR  Informed Consent: I have reviewed the patients History and Physical, chart, labs and discussed the procedure including the risks, benefits and alternatives for the proposed anesthesia with the patient or authorized representative who has indicated his/her understanding and acceptance.   Dental advisory given  Plan Discussed with: CRNA  Anesthesia Plan Comments:         Anesthesia Quick Evaluation

## 2014-12-09 NOTE — Op Note (Signed)
12/09/2014  10:45 AM  PATIENT:   Allison Chavez  74 y.o. female  PRE-OPERATIVE DIAGNOSIS:  LEFT RETAINED HARDWARE FROM LEFT SHOULDER  POST-OPERATIVE DIAGNOSIS:  same  PROCEDURE:  Hardware removal left shoulder  SURGEON:  Chung Chagoya, Metta Clines M.D.  ASSISTANTS: Shuford pac   ANESTHESIA:   GET + local  EBL: <50cc  SPECIMEN:  none  Drains: none   PATIENT DISPOSITION:  PACU - hemodynamically stable.    PLAN OF CARE: Discharge to home after PACU  Dictation# [id number]   Contact # [phone]

## 2014-12-09 NOTE — Discharge Instructions (Signed)
You can shower in 3 days. The dressing can be changed at that time. There is a glue like substance (dermabond) across incision that is a little blood tinged, leave this in place. You can dangle arm and move elbow wrist and hand and gently begin moving your shoulder.  General Anesthesia, Care After Refer to this sheet in the next few weeks. These instructions provide you with information on caring for yourself after your procedure. Your health care provider may also give you more specific instructions. Your treatment has been planned according to current medical practices, but problems sometimes occur. Call your health care provider if you have any problems or questions after your procedure. WHAT TO EXPECT AFTER THE PROCEDURE After the procedure, it is typical to experience:  Sleepiness.  Nausea and vomiting. HOME CARE INSTRUCTIONS  For the first 24 hours after general anesthesia:  Have a responsible person with you.  Do not drive a car. If you are alone, do not take public transportation.  Do not drink alcohol.  Do not take medicine that has not been prescribed by your health care provider.  Do not sign important papers or make important decisions.  You may resume a normal diet and activities as directed by your health care provider.  Change bandages (dressings) as directed.  If you have questions or problems that seem related to general anesthesia, call the hospital and ask for the anesthetist or anesthesiologist on call. SEEK MEDICAL CARE IF:  You have nausea and vomiting that continue the day after anesthesia.  You develop a rash. SEEK IMMEDIATE MEDICAL CARE IF:   You have difficulty breathing.  You have chest pain.  You have any allergic problems. Document Released: 10/22/2000 Document Revised: 07/21/2013 Document Reviewed: 01/29/2013 [facility] Patient Information 2015 [place], [place]. This information is not intended to replace advice given to you by your health care  provider. Make sure you discuss any questions you have with your health care provider.

## 2014-12-10 NOTE — Op Note (Signed)
Allison Chavez, Allison Chavez                  ACCOUNT NO.:  [id number]  MEDICAL RECORD NO.:  [id number]  LOCATION:  [facility]                         FACILITY:  [facility]  PHYSICIAN:  Metta Clines. Dorn Hartshorne, M.D.  DATE OF BIRTH:  January 14, 1941  DATE OF PROCEDURE:  12/09/2014 DATE OF DISCHARGE:  12/09/2014                              OPERATIVE REPORT   PREOPERATIVE DIAGNOSIS:  Painful retained hardware, left shoulder.  POSTOPERATIVE DIAGNOSIS:  Painful retained hardware, left shoulder.  PROCEDURE:  Hardware removal from left shoulder in the form of 3 cannulated 4.5 screws and their associated washers.  SURGEON:  Metta Clines. Nikkolas Coomes, M.D.  Terrence DupontOlivia Mackie A. Shuford, PA-C.  ANESTHESIA:  General endotracheal as well as local.  ESTIMATED BLOOD LOSS:  Less than 50 mL.  DRAINS:  None.  HISTORY:  Allison Chavez is a 74 year old female who sustained a left shoulder fracture dislocation for which we performed an open reduction and internal fixation of the greater tuberosity fracture fragment last month.  On followup radiographs in the office, her hardware had become prominent and showed evidence of loosening and in consideration of the fact that it is time of the index procedure, her bone was significantly osteoporotic.  Fixation was fair at best and the fact that her hardware was now becoming prominent and painful and palpable clinically.  She is brought back to the operating room at this time for planned hardware removal.  I preoperatively counseled [name] regarding treatment options as well as potential risks versus benefits thereof.  Possible surgical complications were reviewed with potential for bleeding, infection, neurovascular injury, persistent pain, loss of motion, anesthetic complications, and possible need for additional surgery.  She understands and accepts and agrees with our planned procedure.  PROCEDURE IN DETAIL:  After undergoing routine preop evaluation, the patient received prophylactic  antibiotics.  She was then brought to the operating room, placed supine on the operating room table, underwent smooth induction of general endotracheal anesthesia.  Placed into beach- chair position and appropriately padded and protected.  Left shoulder girdle region was sterilely prepped and draped in a standard fashion. Time-out was called.  We made a lateral deltoid splitting incision through a previous incision on the lateral margin of the acromion approximately 4 cm in length.  Skin flaps were elevated.  Dissection carried deeply.  Electrocautery was used for hemostasis.  We then split the deltoid fibers from the lateral margin of the acromion for a length of approximately 4 cm through the previous deltoid split.  We did find significant adhesions beneath the deltoid to the underlying bursal tissues and the area around the previous ORIF and all 3 screw heads were readily identified with bursal tissue overlying them and we excised with a combination of blunt and sharp dissection.  The screw heads were all then identified and removed with their attendant washers using the 4.5 screwdriver.  I then went ahead and released multiple adhesions in the subacromial/subdeltoid bursal region with a combination of blunt and sharp dissection.  The subdeltoid/subacromial bursal space was then copiously irrigated.  Hemostasis was obtained.  We then reapproximated the deltoid split using a series of figure-of-eight #1 Vicryl sutures, 2- 0  Vicryl was used for the subcu layer, intracuticular 3-0 Monocryl for the skin followed by Dermabond and dry dressing.  Left arm was placed in a sling.  The patient was awakened, extubated, and taken to the recovery room in a stable condition.  Jenetta Loges, PA-C was used as an Environmental consultant throughout this case and essential for help with positioning of patient, positioning of extremity, wound closure, and intraoperative decision making.     Metta Clines. Fariha Goto,  M.D.     KMS/MEDQ  D:  12/09/2014  T:  12/10/2014  Job:  [id number]

## 2014-12-13 ENCOUNTER — Encounter (HOSPITAL_COMMUNITY): Payer: Self-pay | Admitting: Orthopedic Surgery

## 2014-12-21 ENCOUNTER — Encounter: Payer: Self-pay | Admitting: Family Medicine

## 2014-12-24 DIAGNOSIS — S43005D Unspecified dislocation of left shoulder joint, subsequent encounter: Secondary | ICD-10-CM | POA: Diagnosis not present

## 2014-12-28 DIAGNOSIS — S43005D Unspecified dislocation of left shoulder joint, subsequent encounter: Secondary | ICD-10-CM | POA: Diagnosis not present

## 2014-12-30 DIAGNOSIS — S43005D Unspecified dislocation of left shoulder joint, subsequent encounter: Secondary | ICD-10-CM | POA: Diagnosis not present

## 2015-01-11 ENCOUNTER — Encounter: Payer: Self-pay | Admitting: Physician Assistant

## 2015-01-11 ENCOUNTER — Ambulatory Visit (INDEPENDENT_AMBULATORY_CARE_PROVIDER_SITE_OTHER): Payer: Medicare Other | Admitting: Physician Assistant

## 2015-01-11 VITALS — BP 110/62 | HR 65 | Temp 98.0°F | Resp 16 | Ht 61.75 in | Wt 136.8 lb

## 2015-01-11 DIAGNOSIS — F418 Other specified anxiety disorders: Secondary | ICD-10-CM

## 2015-01-11 DIAGNOSIS — Z0000 Encounter for general adult medical examination without abnormal findings: Secondary | ICD-10-CM | POA: Diagnosis not present

## 2015-01-11 DIAGNOSIS — R41840 Attention and concentration deficit: Secondary | ICD-10-CM

## 2015-01-11 DIAGNOSIS — I10 Essential (primary) hypertension: Secondary | ICD-10-CM

## 2015-01-11 DIAGNOSIS — E785 Hyperlipidemia, unspecified: Secondary | ICD-10-CM

## 2015-01-11 DIAGNOSIS — M8580 Other specified disorders of bone density and structure, unspecified site: Secondary | ICD-10-CM

## 2015-01-11 DIAGNOSIS — Z8739 Personal history of other diseases of the musculoskeletal system and connective tissue: Secondary | ICD-10-CM | POA: Diagnosis not present

## 2015-01-11 HISTORY — DX: Other specified disorders of bone density and structure, unspecified site: M85.80

## 2015-01-11 HISTORY — DX: Hyperlipidemia, unspecified: E78.5

## 2015-01-11 LAB — POCT URINALYSIS DIPSTICK
Bilirubin, UA: NEGATIVE
GLUCOSE UA: NEGATIVE
Ketones, UA: NEGATIVE
Leukocytes, UA: NEGATIVE
NITRITE UA: NEGATIVE
Protein, UA: NEGATIVE
Spec Grav, UA: 1.015
UROBILINOGEN UA: 0.2
pH, UA: 6

## 2015-01-11 LAB — CBC WITH DIFFERENTIAL/PLATELET
BASOS ABS: 0.1 10*3/uL (ref 0.0–0.1)
BASOS PCT: 1 % (ref 0–1)
EOS PCT: 3 % (ref 0–5)
Eosinophils Absolute: 0.2 10*3/uL (ref 0.0–0.7)
HCT: 33.8 % — ABNORMAL LOW (ref 36.0–46.0)
Hemoglobin: 11.3 g/dL — ABNORMAL LOW (ref 12.0–15.0)
LYMPHS PCT: 29 % (ref 12–46)
Lymphs Abs: 1.7 10*3/uL (ref 0.7–4.0)
MCH: 30.8 pg (ref 26.0–34.0)
MCHC: 33.4 g/dL (ref 30.0–36.0)
MCV: 92.1 fL (ref 78.0–100.0)
MPV: 9.6 fL (ref 8.6–12.4)
Monocytes Absolute: 0.5 10*3/uL (ref 0.1–1.0)
Monocytes Relative: 9 % (ref 3–12)
Neutro Abs: 3.4 10*3/uL (ref 1.7–7.7)
Neutrophils Relative %: 58 % (ref 43–77)
Platelets: 245 10*3/uL (ref 150–400)
RBC: 3.67 MIL/uL — AB (ref 3.87–5.11)
RDW: 15.1 % (ref 11.5–15.5)
WBC: 5.8 10*3/uL (ref 4.0–10.5)

## 2015-01-11 LAB — POCT UA - MICROSCOPIC ONLY
CASTS, UR, LPF, POC: NEGATIVE
Crystals, Ur, HPF, POC: NEGATIVE
Mucus, UA: NEGATIVE
YEAST UA: NEGATIVE

## 2015-01-11 LAB — COMPREHENSIVE METABOLIC PANEL
ALK PHOS: 61 U/L (ref 39–117)
ALT: 9 U/L (ref 0–35)
AST: 14 U/L (ref 0–37)
Albumin: 4 g/dL (ref 3.5–5.2)
BILIRUBIN TOTAL: 0.4 mg/dL (ref 0.2–1.2)
BUN: 18 mg/dL (ref 6–23)
CHLORIDE: 101 meq/L (ref 96–112)
CO2: 27 meq/L (ref 19–32)
Calcium: 9.1 mg/dL (ref 8.4–10.5)
Creat: 1.05 mg/dL (ref 0.50–1.10)
Glucose, Bld: 82 mg/dL (ref 70–99)
POTASSIUM: 4.6 meq/L (ref 3.5–5.3)
Sodium: 137 mEq/L (ref 135–145)
TOTAL PROTEIN: 6.3 g/dL (ref 6.0–8.3)

## 2015-01-11 LAB — TSH: TSH: 3.019 u[IU]/mL (ref 0.350–4.500)

## 2015-01-11 LAB — LIPID PANEL
CHOL/HDL RATIO: 2.5 ratio
Cholesterol: 214 mg/dL — ABNORMAL HIGH (ref 0–200)
HDL: 86 mg/dL (ref >=46)
LDL CALC: 115 mg/dL — AB (ref 0–99)
Triglycerides: 66 mg/dL (ref <150)
VLDL: 13 mg/dL (ref 0–40)

## 2015-01-11 MED ORDER — CLORAZEPATE DIPOTASSIUM 7.5 MG PO TABS: ORAL_TABLET | ORAL | Status: DC

## 2015-01-11 NOTE — Patient Instructions (Addendum)
My billing person is not in the office today. I will talk to her next time she is in about who may be your provider, and contact you with the answer. After that, we can determine whether you will follow up with me or with Allison Chavez for your next annual physical exam in 1 year or as needed.  I will contact you with your lab results as soon as they are available.   If you have not heard from me in 2 weeks, please contact me.  The fastest way to get your results is to register for My Chart (see the instructions on the last page of this printout).  Keeping You Healthy  Get These Tests  Blood Pressure- Have your blood pressure checked by your healthcare provider at least once a year.  Normal blood pressure is 120/80.  Weight- Have your body mass index (BMI) calculated to screen for obesity.  BMI is a measure of body fat based on height and weight.  You can calculate your own BMI at [employer]  Cholesterol- Have your cholesterol checked every year.  Diabetes- Have your blood sugar checked every year if you have high blood pressure, high cholesterol, a family history of diabetes or if you are overweight.  Pap Test - Have a pap test every 1 to 5 years if you have been sexually active.  If you are older than 65 and recent pap tests have been normal you may not need additional pap tests.  In addition, if you have had a hysterectomy  for benign disease additional pap tests are not necessary.  Mammogram-Yearly mammograms are essential for early detection of breast cancer  Screening for Colon Cancer- Colonoscopy starting at age 20. Screening may begin sooner depending on your family history and other health conditions.  Follow up colonoscopy as directed by your Gastroenterologist.  Screening for Osteoporosis- Screening begins at age 18 with bone density scanning, sooner if you are at higher risk for developing Osteoporosis.  Get these medicines  Calcium with Vitamin D- Your body  requires 1200-1500 mg of Calcium a day and [phone] IU of Vitamin D a day.  You can only absorb 500 mg of Calcium at a time therefore Calcium must be taken in 2 or 3 separate doses throughout the day.  Hormones- Hormone therapy has been associated with increased risk for certain cancers and heart disease.  Talk to your healthcare provider about if you need relief from menopausal symptoms.  Aspirin- Ask your healthcare provider about taking Aspirin to prevent Heart Disease and Stroke.  Get these Immuniztions  Flu shot- Every fall  Pneumonia shot- Once after the age of 89; if you are younger ask your healthcare provider if you need a pneumonia shot.  Tetanus- Every ten years.  Zostavax- Once after the age of 104 to prevent shingles.  Take these steps  Don't smoke- Your healthcare provider can help you quit. For tips on how to quit, ask your healthcare provider or go to www.smokefree.gov or call 1-800 QUIT-NOW.  Be physically active- Exercise 5 days a week for a minimum of 30 minutes.  If you are not already physically active, start slow and gradually work up to 30 minutes of moderate physical activity.  Try walking, dancing, bike riding, swimming, etc.  Eat a healthy diet- Eat a variety of healthy foods such as fruits, vegetables, whole grains, low fat milk, low fat cheeses, yogurt, lean meats, chicken, fish, eggs, dried beans, tofu, etc.  For more information go to  www.thenutritionsource.org  Dental visit- Brush and floss teeth twice daily; visit your dentist twice a year.  Eye exam- Visit your Optometrist or Ophthalmologist yearly.  Drink alcohol in moderation- Limit alcohol intake to one drink or less a day.  Never drink and drive.  Depression- Your emotional health is as important as your physical health.  If you're feeling down or losing interest in things you normally enjoy, please talk to your healthcare provider.  Seat Belts- can save your life; always wear one  Smoke/Carbon  Monoxide detectors- These detectors need to be installed on the appropriate level of your home.  Replace batteries at least once a year.  Violence- If anyone is threatening or hurting you, please tell your healthcare provider.  Living Will/ Health care power of attorney- Discuss with your healthcare provider and family.

## 2015-01-11 NOTE — Progress Notes (Signed)
Patient ID: Allison Chavez, female    DOB: 1941-04-18, 74 y.o.   MRN: [id number]  PCP: Ellsworth Lennox, MD   Subjective:  HPI Pt presents today for an annual physical exam.  Pt states she has been struggling with keep her attention on one thing. She states, "my OCD is interrupted by my ADD keeping me from doing things I like to do", but she has not been diagnosed with OCD or ADD. She states that she gets interested in 1 thing, then goes to the next room and forgets ,and that her name recall has declined. She denies any other loss in memory or being confused.  Discussion with the pt about her depression did not correlate with her answers to the PHQ-9 screening, where she reported having more issues than she did during our conversation. She says she has a pretty good mood most days, but has been experiencing more sadness recently associated with the unexpected death of her best friend in July 25, 2014, and the loss of a friendship with her neighbor in April 2016. She still gets out of her house to walk around the neighborhood and says she has a lot of friends and support.  She denies feeling helpless, and says sleep is not an issue and that she enjoys sleeping and wishes she could "stay in her happy dreams" when she wakes up in the morning. She is having some trouble doing the things she enjoys doing, like playing the piano, but she states this is due to physical reasons related to her surgery and old age rather than not having the drive to do them.   She had 2 surgeries recently on her left shoulder, in February 2016 for shoulder dislocation and pin placement for humerus fracture, and in May 2016 for removal of the hardware placed in February. She is currently in PT for her left shoulder, but is still having issues with ROM and strength, which limits her activity some.  Hypertension is well controlled, she is tolerating her medication well.   Ophthalmology: Last appointment August 2015.  Appointment scheduled in August 2016. Colonoscopy: Last done in 2007, normal. Due for repeat in 2017. Mammogram: Last done 10/29/2011, no abnormal results. Due for repeat 10/28/2013. She has not scheduled it yet, but is aware that she needs to. Pap: No longer recommended d/t age and 3 normal results. Vaccinations: UTD DEXA scan: last done 11/24/2002, diagnosed with Osteopenia.   Review of Systems  Constitutional: Negative.   HENT: Negative.   Eyes: Negative.   Respiratory: Negative.   Cardiovascular: Negative.   Gastrointestinal: Negative.   Endocrine: Negative.   Genitourinary: Negative.   Musculoskeletal: Negative.   Skin: Positive for wound (From choulder surgery, healing well). Negative for color change, pallor and rash.  Allergic/Immunologic: Negative.   Neurological: Negative.   Hematological: Negative.   Psychiatric/Behavioral: Positive for dysphoric mood (related to death of best friend and loss of friendship). Negative for suicidal ideas, hallucinations, behavioral problems, confusion, self-injury, decreased concentration and agitation. The patient is nervous/anxious. The patient is not hyperactive.      Patient Active Problem List   Diagnosis Date Noted  . Osteopenia 01/11/2015  . History of dislocation of shoulder 09/22/2014  . Insomnia 10/08/2013  . HTN (hypertension) 03/07/2012  . Depression with anxiety 03/07/2012  . Renal insufficiency, mild     Past Medical History  Diagnosis Date  . Hypertension   . Anxiety   . Depression   . DJD (degenerative joint disease) of cervical spine   .  DJD of shoulder, right   . Renal insufficiency, mild   . Cataract   . Pneumonia     as a child    Prior to Admission medications   Medication Sig Start Date End Date Taking? Authorizing Provider  cetirizine (ZYRTEC) 10 MG tablet Take 1 tablet (10 mg total) by mouth daily. Patient taking differently: Take 10 mg by mouth daily as needed for allergies.  08/31/13  Yes Orma Flaming,  MD  citalopram (CELEXA) 40 MG tablet Take 1 1/2 tablets daily or as directed. Patient taking differently: Take 60 mg by mouth daily.  04/14/14  Yes Barton Fanny, MD  clorazepate (TRANXENE) 7.5 MG tablet TAKE (1) TABLET DAILY AS NEEDED. 10/29/14  Yes Barton Fanny, MD  desonide (DESOWEN) 0.05 % cream Apply topically 2 (two) times daily. Patient taking differently: Apply 1 application topically daily as needed (dry skin).  03/25/13  Yes Thao P Le, DO  diazepam (VALIUM) 5 MG tablet Take 0.5-1 tablets (2.5-5 mg total) by mouth every 6 (six) hours as needed for muscle spasms or sedation. 12/09/14  Yes Tracy Shuford, PA-C  IBUPROFEN PO Take 2 tablets by mouth every 6 (six) hours as needed (pain).   Yes Historical Provider, MD  losartan-hydrochlorothiazide (HYZAAR) 50-12.5 MG per tablet Take 1 tablet by mouth daily. PATIENT NEEDS OFFICE VISIT FOR ADDITIONAL REFILLS 10/08/14  Yes Barton Fanny, MD  triamcinolone cream (KENALOG) 0.1 % Apply 1 application topically 2 (two) times daily as needed (skin).    Yes Historical Provider, MD  zolpidem (AMBIEN) 5 MG tablet TAKE 1 TABLET AT BEDTIME IF NEEDED FOR SLEEP. 11/11/14  Yes Barton Fanny, MD  aspirin 325 MG tablet Take 325 mg by mouth every 6 (six) hours as needed for moderate pain (pain).    Historical Provider, MD    Allergies  Allergen Reactions  . Ace Inhibitors     Cough   . Sulfa Antibiotics Hives  . Wellbutrin [Bupropion] Rash    Only with generic, immediate release    Past Medical, Surgical Family and Social History reviewed and updated.   Objective:   Vitals: BP 110/62 mmHg  Pulse 65  Temp(Src) 98 F (36.7 C) (Oral)  Resp 16  Ht 5' 1.75" (1.568 m)  Wt 136 lb 12.8 oz (62.052 kg)  BMI 25.24 kg/m2  SpO2 98%   Physical Exam  Constitutional: She is oriented to person, place, and time. She appears well-developed and well-nourished. She is cooperative. No distress.  HENT:  Head: Normocephalic and atraumatic.  Right  Ear: Hearing, tympanic membrane, external ear and ear canal normal.  Left Ear: Hearing, tympanic membrane, external ear and ear canal normal.  Nose: Nose normal.  Mouth/Throat: Uvula is midline, oropharynx is clear and moist and mucous membranes are normal.  Eyes: Conjunctivae, EOM and lids are normal. Pupils are equal, round, and reactive to light.  Neck: Trachea normal, normal range of motion and phonation normal. Neck supple. No spinous process tenderness present. No thyroid mass and no thyromegaly present.  Cardiovascular: Normal rate, regular rhythm, normal heart sounds and normal pulses.  Exam reveals no gallop and no friction rub.   No murmur heard. Pulmonary/Chest: Effort normal and breath sounds normal. No respiratory distress. She has no wheezes. She has no rales. Right breast exhibits no inverted nipple, no mass, no nipple discharge, no skin change and no tenderness. Left breast exhibits no inverted nipple, no mass, no nipple discharge, no skin change and no tenderness. Breasts are  symmetrical.  Abdominal: Soft. Normal appearance and bowel sounds are normal. She exhibits no distension. There is no hepatosplenomegaly. There is no tenderness. There is no rebound and no guarding.  Musculoskeletal:       Right shoulder: Normal.       Left shoulder: She exhibits decreased range of motion (Decreased active abduction and flexion.), tenderness (Mild), pain and decreased strength (With flexion and abduction.). She exhibits no bony tenderness, no swelling, no effusion, no crepitus, no deformity, no laceration, no spasm and normal pulse.       Right elbow: Normal.      Left elbow: Normal.  Lymphadenopathy:       Head (right side): No submental, no submandibular, no tonsillar, no preauricular, no posterior auricular and no occipital adenopathy present.       Head (left side): No submental, no submandibular, no tonsillar, no preauricular, no posterior auricular and no occipital adenopathy present.     She has no cervical adenopathy.    She has no axillary adenopathy.       Right: No supraclavicular adenopathy present.       Left: No supraclavicular adenopathy present.  Neurological: She is alert and oriented to person, place, and time. She has normal reflexes.  Skin: Skin is warm, dry and intact.     Psychiatric: She has a normal mood and affect. Her speech is normal and behavior is normal. Thought content normal.  Pt tearful when talking about recent death of friend.     Assessment & Plan:   Khloey was seen today for annual exam.  Diagnoses and all orders for this visit:  Annual physical exam       -     See abnormal results below.       -     Plan to follow-up in 1 year for next annual physical exam, or as needed.  Osteopenia -     Last DEXA 2004, diagnosed with Osteopenia. -     Plan to check Vit D and refer for updated DEXA scan. Orders: -     Vit D  25 hydroxy (rtn osteoporosis monitoring) -     DG Bone Density; Future  Depression with anxiety -     Pt is still grieving the loss of her best friend, which is putting a strain on her depression. -     Allow pt to return to her regular regimen (which was disturbed by her surgery and pain medications). -     Stop taking Valium, continue with Tranxene.  Orders: -     clorazepate (TRANXENE) 7.5 MG tablet; TAKE (1) TABLET DAILY AS NEEDED.  Essential hypertension -  Well controlled, await lab results. Orders: -     CBC with Differential/Platelet -     Comprehensive metabolic panel -     TSH -     POCT UA - Microscopic Only -     POCT urinalysis dipstick  Hyperlipidemia -     Last lipid panel March 2015, elevated total cholesterol and LDL -     Await lab results.  Orders: -     Lipid panel  Attention disturbance -    Pt experiencing some attention disturbance, would like to get evaluated. Orders: -     Ambulatory referral to Psychiatry  History of dislocation of shoulder      -     Continue PT, follow-up with  Dr. Onnie Graham.      Management consultant, PA-S [facility] 01/11/2015 11:53 AM
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
## 2015-01-12 LAB — VITAMIN D 25 HYDROXY (VIT D DEFICIENCY, FRACTURES): VIT D 25 HYDROXY: 43 ng/mL (ref 30–100)

## 2015-01-17 ENCOUNTER — Other Ambulatory Visit: Payer: Self-pay | Admitting: Family Medicine

## 2015-01-17 ENCOUNTER — Ambulatory Visit
Admission: RE | Admit: 2015-01-17 | Discharge: 2015-01-17 | Disposition: A | Discharge status: Home / Self Care | Payer: Medicare Other | Source: Ambulatory Visit | Attending: Physician Assistant | Admitting: Physician Assistant

## 2015-01-17 DIAGNOSIS — M8580 Other specified disorders of bone density and structure, unspecified site: Secondary | ICD-10-CM

## 2015-01-19 ENCOUNTER — Ambulatory Visit (INDEPENDENT_AMBULATORY_CARE_PROVIDER_SITE_OTHER): Payer: Medicare Other | Admitting: Emergency Medicine

## 2015-01-19 VITALS — BP 108/60 | HR 77 | Temp 97.6°F | Resp 12 | Ht 62.0 in | Wt 134.6 lb

## 2015-01-19 DIAGNOSIS — R300 Dysuria: Secondary | ICD-10-CM

## 2015-01-19 DIAGNOSIS — R309 Painful micturition, unspecified: Secondary | ICD-10-CM

## 2015-01-19 LAB — POCT URINALYSIS DIPSTICK
Bilirubin, UA: NEGATIVE
GLUCOSE UA: NEGATIVE
KETONES UA: NEGATIVE
Nitrite, UA: POSITIVE
SPEC GRAV UA: 1.015
Urobilinogen, UA: 0.2
pH, UA: 6.5

## 2015-01-19 LAB — POCT UA - MICROSCOPIC ONLY
Casts, Ur, LPF, POC: NEGATIVE
Crystals, Ur, HPF, POC: NEGATIVE
MUCUS UA: NEGATIVE
Yeast, UA: NEGATIVE

## 2015-01-19 MED ORDER — CEPHALEXIN 500 MG PO CAPS: 500.0000 mg | ORAL_CAPSULE | Freq: Two times a day (BID) | ORAL | Status: DC

## 2015-01-19 NOTE — Patient Instructions (Signed)
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
## 2015-01-20 ENCOUNTER — Other Ambulatory Visit: Payer: Self-pay | Admitting: Radiology

## 2015-01-20 DIAGNOSIS — D649 Anemia, unspecified: Secondary | ICD-10-CM

## 2015-01-22 ENCOUNTER — Encounter: Payer: Self-pay | Admitting: Emergency Medicine

## 2015-01-22 ENCOUNTER — Encounter: Payer: Self-pay | Admitting: Physician Assistant

## 2015-01-22 LAB — URINE CULTURE: Colony Count: >=100000

## 2015-02-01 ENCOUNTER — Telehealth: Payer: Self-pay | Admitting: Physician Assistant

## 2015-02-01 NOTE — Telephone Encounter (Addendum)
Please call this patient. Her bone density is low, and worse compared to the last DEXA. She needs to take 1500 mg of calcium and 1000 IU of vitamin D each day. If she is doing this already, then we need to add a prescription medication to help reduce the rate of bone loss. Otherwise, we will recheck in 2 years.

## 2015-02-02 NOTE — Telephone Encounter (Signed)
The medication would be a bisphosphonate (Fosamax or Actonel), which can commonly cause GI upset, or calcitonin (nasal spray; Miacalcin or Fortical), which can cause nasal symptoms like runny or bloody nose, headache.  Estrogen is another option, less commonly used now in her age group due to increased risk of cardiovascular events, in addition to increased risk of blood clots and breast cancer.  If she's taking the calcium and vitamin D less than half the time, I recommend increasing to Allison Chavez and being consistent with that, before moving on to one of these medications.  Doing weight bearing exercise can help build bone mass, too.

## 2015-02-02 NOTE — Telephone Encounter (Signed)
Spoke with pt, advised message from [place]. She is taking 2000 units of Vit D and she cannot tell if she is taking 500mg  of Calcium due to the letters being so small to read on the bottle. She is not very faithful in taking these vitamins. Pt would like to see if we could tell her what Chelle would Rx. Side effects?

## 2015-02-02 NOTE — Telephone Encounter (Signed)
Left message for pt to call back  °

## 2015-02-04 NOTE — Telephone Encounter (Signed)
Spoke with pt, advised message from Chelle. Pt understood. 

## 2015-02-04 NOTE — Telephone Encounter (Signed)
Left message for pt to call back  °

## 2015-02-24 ENCOUNTER — Encounter: Payer: Self-pay | Admitting: Physician Assistant

## 2015-02-25 ENCOUNTER — Ambulatory Visit (INDEPENDENT_AMBULATORY_CARE_PROVIDER_SITE_OTHER): Payer: Medicare Other | Admitting: Family Medicine

## 2015-02-25 VITALS — BP 128/70 | HR 61 | Temp 98.0°F | Resp 16 | Ht 62.0 in | Wt 137.0 lb

## 2015-02-25 DIAGNOSIS — R300 Dysuria: Secondary | ICD-10-CM

## 2015-02-25 LAB — POCT URINALYSIS DIPSTICK
Bilirubin, UA: NEGATIVE
Glucose, UA: NEGATIVE
Ketones, UA: NEGATIVE
Nitrite, UA: POSITIVE
Protein, UA: NEGATIVE
Spec Grav, UA: <=1.005
Urobilinogen, UA: 0.2
pH, UA: 6.5

## 2015-02-25 LAB — POCT UA - MICROSCOPIC ONLY
Casts, Ur, LPF, POC: NEGATIVE
Crystals, Ur, HPF, POC: NEGATIVE
Mucus, UA: NEGATIVE
YEAST UA: NEGATIVE

## 2015-02-25 MED ORDER — CEPHALEXIN 500 MG PO CAPS: 500.0000 mg | ORAL_CAPSULE | Freq: Two times a day (BID) | ORAL | Status: DC

## 2015-02-25 NOTE — Patient Instructions (Addendum)
I will be in touch with your urine culture results.  Let me know if you are not better soon or if symptoms come back again Take the keflex antibiotic as directed for 10 days this time Let me know if you are not improved in the next 1-2 days- Sooner if worse.
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
## 2015-02-26 LAB — URINE CULTURE

## 2015-04-07 ENCOUNTER — Other Ambulatory Visit: Payer: Self-pay | Admitting: Physician Assistant

## 2015-04-08 NOTE — Telephone Encounter (Signed)
Spoke with pt. She does still take this prn (last OV said she was not)

## 2015-04-11 ENCOUNTER — Other Ambulatory Visit: Payer: Self-pay | Admitting: Physician Assistant

## 2015-04-12 NOTE — Telephone Encounter (Signed)
Faxed RF 

## 2015-05-23 ENCOUNTER — Telehealth: Payer: Self-pay

## 2015-05-23 DIAGNOSIS — F418 Other specified anxiety disorders: Secondary | ICD-10-CM

## 2015-05-23 MED ORDER — CLORAZEPATE DIPOTASSIUM 7.5 MG PO TABS: ORAL_TABLET | ORAL | Status: DC

## 2015-05-23 NOTE — Telephone Encounter (Signed)
Pt states [facility] has faxed over a request for her Clorzaepate and we havent responded?   Best phone for pt is [phone]

## 2015-05-23 NOTE — Telephone Encounter (Signed)
I see no request in the system. If they faxed it, we likely did not receive it.  Meds ordered this encounter  Medications  . clorazepate (TRANXENE) 7.5 MG tablet    Sig: TAKE (1) TABLET DAILY AS NEEDED.    Dispense:  30 tablet    Refill:  0    Order Specific Question:  Supervising Provider    Answer:  DOOLITTLE, ROBERT P [[id number]]

## 2015-05-24 NOTE — Telephone Encounter (Signed)
Called into pharm and notified pt.

## 2015-06-13 ENCOUNTER — Other Ambulatory Visit: Payer: Self-pay | Admitting: Physician Assistant

## 2015-06-14 NOTE — Telephone Encounter (Signed)
Rx printed at 104. Will bring to 102 after clinic.  Meds ordered this encounter  Medications  . zolpidem (AMBIEN) 5 MG tablet    Sig: TAKE 1 TABLET AT BEDTIME IF NEEDED FOR SLEEP.    Dispense:  30 tablet    Refill:  0

## 2015-06-14 NOTE — Telephone Encounter (Signed)
Rx faxed

## 2015-06-19 ENCOUNTER — Other Ambulatory Visit: Payer: Self-pay | Admitting: Physician Assistant

## 2015-06-21 NOTE — Telephone Encounter (Signed)
Rx printed at 104. Will bring to 102 after clinic.  Meds ordered this encounter  Medications  . clorazepate (TRANXENE) 7.5 MG tablet    Sig: TAKE 1 TABLET DAILY AS NEEDED.    Dispense:  30 tablet    Refill:  0

## 2015-06-22 NOTE — Telephone Encounter (Signed)
Faxed

## 2015-07-04 ENCOUNTER — Telehealth: Payer: Self-pay | Admitting: Family Medicine

## 2015-07-04 NOTE — Telephone Encounter (Signed)
Spoke with patient and she is going to come  In on December 27 to see Chelle to take care of gaps for [employer] paperwork.

## 2015-07-26 ENCOUNTER — Ambulatory Visit: Payer: Self-pay | Admitting: Physician Assistant

## 2015-07-26 ENCOUNTER — Encounter: Payer: Self-pay | Admitting: Physician Assistant

## 2015-07-26 NOTE — Telephone Encounter (Signed)
Please call this patient and reschedule her appointment with me. She is not a new patient, to me or the practice.

## 2015-08-02 ENCOUNTER — Ambulatory Visit: Payer: Self-pay | Admitting: Physician Assistant

## 2015-08-03 ENCOUNTER — Ambulatory Visit (INDEPENDENT_AMBULATORY_CARE_PROVIDER_SITE_OTHER): Payer: Medicare Other | Admitting: Emergency Medicine

## 2015-08-03 VITALS — BP 110/64 | HR 71 | Temp 98.3°F | Resp 16 | Ht 61.5 in | Wt 142.6 lb

## 2015-08-03 DIAGNOSIS — R300 Dysuria: Secondary | ICD-10-CM | POA: Diagnosis not present

## 2015-08-03 DIAGNOSIS — N3000 Acute cystitis without hematuria: Secondary | ICD-10-CM

## 2015-08-03 LAB — POCT URINALYSIS DIP (MANUAL ENTRY)
Bilirubin, UA: NEGATIVE
Glucose, UA: NEGATIVE
Ketones, POC UA: NEGATIVE
Nitrite, UA: POSITIVE — AB
PH UA: 7
Protein Ur, POC: NEGATIVE
Spec Grav, UA: 1.01
Urobilinogen, UA: 0.2

## 2015-08-03 LAB — POC MICROSCOPIC URINALYSIS (UMFC): Mucus: ABSENT

## 2015-08-03 MED ORDER — NITROFURANTOIN MONOHYD MACRO 100 MG PO CAPS: 100.0000 mg | ORAL_CAPSULE | Freq: Two times a day (BID) | ORAL | Status: DC

## 2015-08-03 MED ORDER — PHENAZOPYRIDINE HCL 200 MG PO TABS: 200.0000 mg | ORAL_TABLET | Freq: Three times a day (TID) | ORAL | Status: DC | PRN

## 2015-08-03 NOTE — Patient Instructions (Signed)

## 2015-08-03 NOTE — Progress Notes (Signed)
Subjective:  Patient ID: Allison Chavez, female    DOB: [birth year]  Age: 75 y.o. MRN: [id number]  CC: Dysuria and Increased frequency of urination   HPI Allison Chavez presents   Patient has dysuria urgency and frequency. Has no vaginal discharge or bleeding. She's been taking over-the-counter AZO with no improvement in her symptoms. She denies any fever chills. No back pain. And no abdominal pain.  History Allison Chavez has a past medical history of Hypertension; Anxiety; Depression; DJD (degenerative joint disease) of cervical spine; DJD of shoulder, right; Renal insufficiency, mild; Cataract; and Pneumonia.   She has past surgical history that includes Tonsillectomy; ORIF shoulder fracture (Left, 09/23/2014); Eye surgery (Bilateral); Colonoscopy; Clavicle surgery (Right); and Hardware Removal (Left, 12/09/2014).   Her  family history includes Cancer in her father, maternal grandfather, and sister; Diabetes in her sister; Diverticulitis in her father; Heart disease in her paternal grandfather; Stroke in her paternal grandmother.  She   reports that she quit smoking about 13 years ago. Her smoking use included Cigarettes. She has a 13.5 pack-year smoking history. She has never used smokeless tobacco. She reports that she drinks alcohol. She reports that she does not use illicit drugs.  Outpatient Prescriptions Prior to Visit  Medication Sig Dispense Refill  . citalopram (CELEXA) 40 MG tablet Take 1 1/2 tablets daily or as directed. 135 tablet 3  . losartan-hydrochlorothiazide (HYZAAR) 50-12.5 MG per tablet TAKE 1 TABLET DAILY. 90 tablet 1  . Phenazopyridine HCl (AZO TABS PO) Take by mouth.    . triamcinolone cream (KENALOG) 0.1 % Apply 1 application topically 2 (two) times daily as needed (skin). Reported on 08/03/2015    . cephALEXin (KEFLEX) 500 MG capsule Take 1 capsule (500 mg total) by mouth 2 (two) times daily. (Patient not taking: Reported on 08/03/2015) 20 capsule 0  . clorazepate (TRANXENE)  7.5 MG tablet TAKE 1 TABLET DAILY AS NEEDED. (Patient not taking: Reported on 08/03/2015) 30 tablet 0  . desonide (DESOWEN) 0.05 % cream Apply topically 2 (two) times daily. (Patient not taking: Reported on 08/03/2015) 30 g 0  . IBUPROFEN PO Take 2 tablets by mouth every 6 (six) hours as needed (pain). Reported on 08/03/2015    . zolpidem (AMBIEN) 5 MG tablet TAKE 1 TABLET AT BEDTIME IF NEEDED FOR SLEEP. (Patient not taking: Reported on 08/03/2015) 30 tablet 0  . cetirizine (ZYRTEC) 10 MG tablet Take 1 tablet (10 mg total) by mouth daily. (Patient not taking: Reported on 01/19/2015) 30 tablet 11   No facility-administered medications prior to visit.    Social History   Social History  . Marital Status: Married    Spouse Name: N/A  . Number of Children: N/A  . Years of Education: N/A   Occupational History  . 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 Smoking status: Former Smoker -- 0.30 packs/day for 45 years    Types: Cigarettes    Quit date: 11/29/2001  . Smokeless tobacco: Never Used  . Alcohol Use: Yes     Comment: occasional  . Drug Use: No  . Sexual Activity:    Partners: Male    Birth Control/ Protection: Post-menopausal   Other Topics Concern  . None   Social History Narrative   Has not lived with husband (3rd marriage) for many years, though they maintain an intermittently sexual relationship. They talk almost daily.  He lives with his female partner. Education: College/Other. Exercise: [employer]  5 days a week for 1 hour.     Review of Systems  Constitutional: Negative for fever, chills and appetite change.  HENT: Negative for congestion, ear pain, postnasal drip, sinus pressure and sore throat.   Eyes: Negative for pain and redness.  Respiratory: Negative for cough, shortness of breath and wheezing.   Cardiovascular: Negative for leg swelling.  Gastrointestinal: Negative for nausea, vomiting, abdominal pain, diarrhea, constipation and blood  in stool.  Endocrine: Negative for polyuria.  Genitourinary: Positive for dysuria, urgency and flank pain. Negative for frequency.  Musculoskeletal: Negative for gait problem.  Skin: Negative for rash.  Neurological: Negative for weakness and headaches.  Psychiatric/Behavioral: Negative for confusion and decreased concentration. The patient is not nervous/anxious.     Objective:  BP 110/64 mmHg  Pulse 71  Temp(Src) 98.3 F (36.8 C) (Oral)  Resp 16  Ht 5' 1.5" (1.562 m)  Wt 142 lb 9.6 oz (64.683 kg)  BMI 26.51 kg/m2  SpO2 97%  Physical Exam  Constitutional: She is oriented to person, place, and time. She appears well-developed and well-nourished.  HENT:  Head: Normocephalic and atraumatic.  Eyes: Conjunctivae are normal. Pupils are equal, round, and reactive to light.  Pulmonary/Chest: Effort normal.  Musculoskeletal: She exhibits no edema.  Neurological: She is alert and oriented to person, place, and time.  Skin: Skin is dry.  Psychiatric: She has a normal mood and affect. Her behavior is normal. Thought content normal.      Assessment & Plan:   Allison Chavez was seen today for dysuria and increased frequency of urination.  Diagnoses and all orders for this visit:  Acute cystitis without hematuria  Dysuria -     POCT Microscopic Urinalysis ([facility]) -     POCT urinalysis dipstick  Other orders -     nitrofurantoin, macrocrystal-monohydrate, (MACROBID) 100 MG capsule; Take 1 capsule (100 mg total) by mouth 2 (two) times daily. -     phenazopyridine (PYRIDIUM) 200 MG tablet; Take 1 tablet (200 mg total) by mouth 3 (three) times daily as needed.   I have discontinued Allison Chavez's cetirizine. I am also having her start on nitrofurantoin (macrocrystal-monohydrate) and phenazopyridine. Additionally, I am having her maintain her desonide, triamcinolone cream, citalopram, IBUPROFEN PO, losartan-hydrochlorothiazide, Phenazopyridine HCl (AZO TABS PO), cephALEXin, zolpidem, and  clorazepate.  Meds ordered this encounter  Medications  . nitrofurantoin, macrocrystal-monohydrate, (MACROBID) 100 MG capsule    Sig: Take 1 capsule (100 mg total) by mouth 2 (two) times daily.    Dispense:  20 capsule    Refill:  0  . phenazopyridine (PYRIDIUM) 200 MG tablet    Sig: Take 1 tablet (200 mg total) by mouth 3 (three) times daily as needed.    Dispense:  6 tablet    Refill:  0    Appropriate red flag conditions were discussed with the patient as well as actions that should be taken.  Patient expressed his understanding.  Follow-up: Return if symptoms worsen or fail to improve.  Roselee Culver, MD  Results for orders placed or performed in visit on 08/03/15  POCT Microscopic Urinalysis ([facility])  Result Value Ref Range   WBC,UR,HPF,POC Many (A) None WBC/hpf   RBC,UR,HPF,POC Few (A) None RBC/hpf   Bacteria Few (A) None, Too numerous to count   Mucus Absent Absent   Epithelial Cells, UR Per Microscopy Few (A) None, Too numerous to count cells/hpf  POCT urinalysis dipstick  Result Value Ref Range   Color, UA yellow yellow   Clarity,  UA clear clear   Glucose, UA negative negative   Bilirubin, UA negative negative   Ketones, POC UA negative negative   Spec Grav, UA 1.010    Blood, UA moderate (A) negative   pH, UA 7.0    Protein Ur, POC negative negative   Urobilinogen, UA 0.2    Nitrite, UA Positive (A) Negative   Leukocytes, UA moderate (2+) (A) Negative

## 2015-08-12 ENCOUNTER — Encounter: Payer: Self-pay | Admitting: Physician Assistant

## 2015-08-16 ENCOUNTER — Other Ambulatory Visit: Payer: Self-pay | Admitting: Physician Assistant

## 2015-08-17 ENCOUNTER — Encounter: Payer: Self-pay | Admitting: Physician Assistant

## 2015-08-17 ENCOUNTER — Other Ambulatory Visit: Payer: Self-pay | Admitting: Physician Assistant

## 2015-08-17 DIAGNOSIS — N898 Other specified noninflammatory disorders of vagina: Secondary | ICD-10-CM | POA: Diagnosis not present

## 2015-08-17 DIAGNOSIS — R300 Dysuria: Secondary | ICD-10-CM | POA: Diagnosis not present

## 2015-08-19 ENCOUNTER — Encounter: Payer: Self-pay | Admitting: Physician Assistant

## 2015-08-22 ENCOUNTER — Encounter: Payer: Self-pay | Admitting: Physician Assistant

## 2015-08-22 ENCOUNTER — Other Ambulatory Visit: Payer: Self-pay | Admitting: Physician Assistant

## 2015-08-22 ENCOUNTER — Ambulatory Visit (INDEPENDENT_AMBULATORY_CARE_PROVIDER_SITE_OTHER): Payer: Medicare Other | Admitting: Physician Assistant

## 2015-08-22 VITALS — BP 108/68 | HR 85 | Temp 98.0°F | Resp 17 | Ht 61.5 in | Wt 144.0 lb

## 2015-08-22 DIAGNOSIS — I10 Essential (primary) hypertension: Secondary | ICD-10-CM | POA: Diagnosis not present

## 2015-08-22 DIAGNOSIS — F418 Other specified anxiety disorders: Secondary | ICD-10-CM

## 2015-08-22 DIAGNOSIS — G4700 Insomnia, unspecified: Secondary | ICD-10-CM

## 2015-08-22 DIAGNOSIS — H3530 Unspecified macular degeneration: Secondary | ICD-10-CM | POA: Insufficient documentation

## 2015-08-22 DIAGNOSIS — Z23 Encounter for immunization: Secondary | ICD-10-CM

## 2015-08-22 DIAGNOSIS — Z1231 Encounter for screening mammogram for malignant neoplasm of breast: Secondary | ICD-10-CM

## 2015-08-22 MED ORDER — ZOLPIDEM TARTRATE 5 MG PO TABS: ORAL_TABLET | ORAL | Status: DC

## 2015-08-22 MED ORDER — LOSARTAN POTASSIUM-HCTZ 50-12.5 MG PO TABS: 1.0000 | ORAL_TABLET | Freq: Every day | ORAL | Status: DC

## 2015-08-22 MED ORDER — CLORAZEPATE DIPOTASSIUM 7.5 MG PO TABS: ORAL_TABLET | ORAL | Status: DC

## 2015-08-22 NOTE — Progress Notes (Signed)
Patient ID: Allison Chavez, female    DOB: [birth year], 75 y.o.   MRN: [id number]  PCP: Wynne Dust  Subjective:   Chief Complaint  Patient presents with  . Medication Refill    HPI Presents for prescription refills.  Feels good. Needs tranxene only intermittently. Can go weeks without it sometimes, and others she needs it at bedtime but not several days in a row. Feels like citalopram is doing a good job for her.  New diagnosis with early macular degeneration. Followed by Dr. Katy Fitch.    Review of Systems  Constitutional: Negative for activity change, appetite change, fatigue and unexpected weight change.  HENT: Negative for congestion, dental problem, ear pain, hearing loss, mouth sores, postnasal drip, rhinorrhea, sneezing, sore throat, tinnitus and trouble swallowing.   Eyes: Negative for photophobia, pain, redness and visual disturbance.  Respiratory: Negative for cough, chest tightness and shortness of breath.   Cardiovascular: Negative for chest pain, palpitations and leg swelling.  Gastrointestinal: Negative for nausea, vomiting, abdominal pain, diarrhea, constipation and blood in stool.  Genitourinary: Negative for dysuria, urgency, frequency and hematuria.  Musculoskeletal: Negative for myalgias, arthralgias, gait problem and neck stiffness.  Skin: Negative for rash.  Neurological: Negative for dizziness, speech difficulty, weakness, light-headedness, numbness and headaches.  Hematological: Negative for adenopathy.  Psychiatric/Behavioral: Negative for confusion and sleep disturbance. The patient is not nervous/anxious (symptoms are generally well controlled).        Patient Active Problem List   Diagnosis Date Noted  . Osteopenia 01/11/2015  . Hyperlipidemia 01/11/2015  . History of dislocation of shoulder 09/22/2014  . Insomnia 10/08/2013  . HTN (hypertension) 03/07/2012  . Depression with anxiety 03/07/2012  . Renal insufficiency, mild      Prior  to Admission medications   Medication Sig Start Date End Date Taking? Authorizing Provider  citalopram (CELEXA) 40 MG tablet TAKE 1 & 1/2 TABLETS ONCE DAILY OR AS DIRECTED. 08/16/15  Yes Joseh Sjogren, PA-C  IBUPROFEN PO Take 2 tablets by mouth every 6 (six) hours as needed (pain). Reported on 08/03/2015   Yes Historical Provider, MD  losartan-hydrochlorothiazide (HYZAAR) 50-12.5 MG per tablet TAKE 1 TABLET DAILY. 01/18/15  Yes Catherene Kaleta, PA-C  triamcinolone cream (KENALOG) 0.1 % Apply 1 application topically 2 (two) times daily as needed (skin). Reported on 08/22/2015   Yes Historical Provider, MD  zolpidem (AMBIEN) 5 MG tablet TAKE 1 TABLET AT BEDTIME IF NEEDED FOR SLEEP. 06/14/15  Yes Laconya Clere, PA-C  clorazepate (TRANXENE) 7.5 MG tablet TAKE 1 TABLET DAILY AS NEEDED. Patient not taking: Reported on 08/03/2015 06/21/15   Harrison Mons, PA-C  desonide (DESOWEN) 0.05 % cream Apply topically 2 (two) times daily. Patient not taking: Reported on 08/03/2015 03/25/13   Thao P Le, DO     Allergies  Allergen Reactions  . Ace Inhibitors     Cough   . Sulfa Antibiotics Hives  . Wellbutrin [Bupropion] Rash    Only with generic, immediate release       Objective:  Physical Exam  Constitutional: She is oriented to person, place, and time. Vital signs are normal. She appears well-developed and well-nourished. She is active and cooperative. No distress.  BP 108/68 mmHg  Pulse 85  Temp(Src) 98 F (36.7 C) (Oral)  Resp 17  Ht 5' 1.5" (1.562 m)  Wt 144 lb (65.318 kg)  BMI 26.77 kg/m2  SpO2 97%  HENT:  Head: Normocephalic and atraumatic.  Right Ear: Hearing normal.  Left Ear: Hearing normal.  Eyes:  Conjunctivae are normal. No scleral icterus.  Neck: Normal range of motion. Neck supple. No thyromegaly present.  Cardiovascular: Normal rate, regular rhythm and normal heart sounds.   Pulses:      Radial pulses are 2+ on the right side, and 2+ on the left side.  Pulmonary/Chest: Effort  normal and breath sounds normal.  Lymphadenopathy:       Head (right side): No tonsillar, no preauricular, no posterior auricular and no occipital adenopathy present.       Head (left side): No tonsillar, no preauricular, no posterior auricular and no occipital adenopathy present.    She has no cervical adenopathy.       Right: No supraclavicular adenopathy present.       Left: No supraclavicular adenopathy present.  Neurological: She is alert and oriented to person, place, and time. No sensory deficit.  Skin: Skin is warm, dry and intact. No rash noted. No cyanosis or erythema. Nails show no clubbing.  Psychiatric: She has a normal mood and affect. Her speech is normal and behavior is normal. Thought content normal.           Assessment & Plan:   1. Essential hypertension Controlled. Continue current treatment. - losartan-hydrochlorothiazide (HYZAAR) 50-12.5 MG tablet; Take 1 tablet by mouth daily.  Dispense: 90 tablet; Refill: 3  2. Depression with anxiety Controlled. Continue current treatment. - clorazepate (TRANXENE) 7.5 MG tablet; TAKE 1 TABLET DAILY AS NEEDED.  Dispense: 30 tablet; Refill: 0  3. Insomnia Controlled. Continue current treatment. - zolpidem (AMBIEN) 5 MG tablet; TAKE 1 TABLET AT BEDTIME IF NEEDED FOR SLEEP.  Dispense: 30 tablet; Refill: 0  4. Encounter for screening mammogram for breast cancer Update mammogram. - MM Digital Screening; Future  5. Need for influenza vaccination - Flu Vaccine QUAD 36+ mos IM   Return in about 5 months (around 01/20/2016) for re-evaluation and fasting labs.   Fara Chute, PA-C Physician Assistant-Certified [facility]

## 2015-08-31 DIAGNOSIS — L570 Actinic keratosis: Secondary | ICD-10-CM | POA: Diagnosis not present

## 2015-09-08 DIAGNOSIS — Z472 Encounter for removal of internal fixation device: Secondary | ICD-10-CM | POA: Diagnosis not present

## 2015-09-08 DIAGNOSIS — S42252D Displaced fracture of greater tuberosity of left humerus, subsequent encounter for fracture with routine healing: Secondary | ICD-10-CM | POA: Diagnosis not present

## 2015-09-13 ENCOUNTER — Ambulatory Visit (INDEPENDENT_AMBULATORY_CARE_PROVIDER_SITE_OTHER): Payer: Medicare Other | Admitting: Family Medicine

## 2015-09-13 VITALS — BP 147/79 | HR 91 | Temp 97.7°F | Resp 16 | Ht 62.38 in | Wt 141.4 lb

## 2015-09-13 DIAGNOSIS — R29898 Other symptoms and signs involving the musculoskeletal system: Secondary | ICD-10-CM | POA: Diagnosis not present
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
## 2015-09-13 NOTE — Patient Instructions (Signed)
Talk to your orthopedic office about completion of the paperwork for your job, as they have treated you  for this condition as well as the surgery. You do have some weakness on exam and limitations in range of motion, but again, discuss this with your orthopedic surgeon. Would recommend you continue home exercises that were prescribed. Let us know if we can help you further in this process.

## 2015-10-24 ENCOUNTER — Other Ambulatory Visit: Payer: Self-pay | Admitting: Physician Assistant

## 2015-10-24 DIAGNOSIS — G4700 Insomnia, unspecified: Secondary | ICD-10-CM

## 2015-10-24 DIAGNOSIS — F418 Other specified anxiety disorders: Secondary | ICD-10-CM

## 2015-10-25 NOTE — Telephone Encounter (Signed)
Will route to [facility], as she is listed primary care provider.

## 2015-10-26 MED ORDER — CLORAZEPATE DIPOTASSIUM 7.5 MG PO TABS: ORAL_TABLET | ORAL | Status: DC

## 2015-10-26 MED ORDER — ZOLPIDEM TARTRATE 5 MG PO TABS: ORAL_TABLET | ORAL | Status: DC

## 2015-10-27 NOTE — Telephone Encounter (Signed)
Faxed and pt notified on mychart.

## 2015-12-18 ENCOUNTER — Other Ambulatory Visit: Payer: Self-pay | Admitting: Physician Assistant

## 2015-12-18 ENCOUNTER — Telehealth: Payer: Self-pay | Admitting: Physician Assistant

## 2015-12-18 DIAGNOSIS — G4700 Insomnia, unspecified: Secondary | ICD-10-CM

## 2015-12-18 DIAGNOSIS — F418 Other specified anxiety disorders: Secondary | ICD-10-CM

## 2015-12-21 ENCOUNTER — Other Ambulatory Visit: Payer: Self-pay | Admitting: Physician Assistant

## 2015-12-22 MED ORDER — CLORAZEPATE DIPOTASSIUM 7.5 MG PO TABS: ORAL_TABLET | ORAL | Status: DC

## 2015-12-22 MED ORDER — ZOLPIDEM TARTRATE 5 MG PO TABS: ORAL_TABLET | ORAL | Status: DC

## 2015-12-22 NOTE — Telephone Encounter (Signed)
Needs visit next month.  Meds ordered this encounter  Medications  . clorazepate (TRANXENE) 7.5 MG tablet    Sig: TAKE 1 TABLET DAILY AS NEEDED.    Dispense:  30 tablet    Refill:  0    Order Specific Question:  Supervising Provider    Answer:  DOOLITTLE, ROBERT P [id number]  . zolpidem (AMBIEN) 5 MG tablet    Sig: TAKE 1 TABLET AT BEDTIME IF NEEDED FOR SLEEP.    Dispense:  30 tablet    Refill:  0    Order Specific Question:  Supervising Provider    Answer:  DOOLITTLE, ROBERT P [id number]

## 2015-12-22 NOTE — Addendum Note (Signed)
Addended by: Harrison Mons S on: 12/22/2015 11:11 PM   Modules accepted: Orders

## 2015-12-23 NOTE — Telephone Encounter (Signed)
LM informing patient that medications she requested has been sent to [facility]

## 2015-12-23 NOTE — Telephone Encounter (Signed)
Faxed

## 2016-01-10 ENCOUNTER — Other Ambulatory Visit: Payer: Self-pay | Admitting: Physician Assistant

## 2016-01-24 ENCOUNTER — Ambulatory Visit (INDEPENDENT_AMBULATORY_CARE_PROVIDER_SITE_OTHER): Payer: Medicare Other | Admitting: Physician Assistant

## 2016-01-24 ENCOUNTER — Encounter: Payer: Self-pay | Admitting: Physician Assistant

## 2016-01-24 ENCOUNTER — Other Ambulatory Visit: Payer: Self-pay | Admitting: Physician Assistant

## 2016-01-24 VITALS — BP 104/60 | HR 71 | Temp 98.1°F | Resp 16 | Ht 62.0 in | Wt 142.2 lb

## 2016-01-24 DIAGNOSIS — F418 Other specified anxiety disorders: Secondary | ICD-10-CM | POA: Diagnosis not present

## 2016-01-24 DIAGNOSIS — I10 Essential (primary) hypertension: Secondary | ICD-10-CM

## 2016-01-24 DIAGNOSIS — Z0000 Encounter for general adult medical examination without abnormal findings: Secondary | ICD-10-CM

## 2016-01-24 DIAGNOSIS — Z1231 Encounter for screening mammogram for malignant neoplasm of breast: Secondary | ICD-10-CM | POA: Diagnosis not present

## 2016-01-24 DIAGNOSIS — M8580 Other specified disorders of bone density and structure, unspecified site: Secondary | ICD-10-CM

## 2016-01-24 DIAGNOSIS — E785 Hyperlipidemia, unspecified: Secondary | ICD-10-CM

## 2016-01-24 DIAGNOSIS — Z13228 Encounter for screening for other metabolic disorders: Secondary | ICD-10-CM | POA: Diagnosis not present

## 2016-01-24 DIAGNOSIS — E559 Vitamin D deficiency, unspecified: Secondary | ICD-10-CM | POA: Diagnosis not present

## 2016-01-24 LAB — POC MICROSCOPIC URINALYSIS (UMFC): MUCUS RE: ABSENT

## 2016-01-24 LAB — COMPREHENSIVE METABOLIC PANEL
ALT: 10 U/L (ref 6–29)
AST: 13 U/L (ref 10–35)
Albumin: 4.2 g/dL (ref 3.6–5.1)
Alkaline Phosphatase: 73 U/L (ref 33–130)
BUN: 23 mg/dL (ref 7–25)
CALCIUM: 9.3 mg/dL (ref 8.6–10.4)
CHLORIDE: 98 mmol/L (ref 98–110)
CO2: 25 mmol/L (ref 20–31)
Creat: 1.22 mg/dL — ABNORMAL HIGH (ref 0.60–0.93)
GLUCOSE: 81 mg/dL (ref 65–99)
POTASSIUM: 4.2 mmol/L (ref 3.5–5.3)
Sodium: 136 mmol/L (ref 135–146)
Total Bilirubin: 0.4 mg/dL (ref 0.2–1.2)
Total Protein: 6.7 g/dL (ref 6.1–8.1)

## 2016-01-24 LAB — CBC WITH DIFFERENTIAL/PLATELET
BASOS ABS: 62 {cells}/uL (ref 0–200)
Basophils Relative: 1 %
EOS ABS: 248 {cells}/uL (ref 15–500)
Eosinophils Relative: 4 %
HCT: 34.2 % — ABNORMAL LOW (ref 35.0–45.0)
Hemoglobin: 11.5 g/dL — ABNORMAL LOW (ref 11.7–15.5)
LYMPHS ABS: 1612 {cells}/uL (ref 850–3900)
LYMPHS PCT: 26 %
MCH: 31.5 pg (ref 27.0–33.0)
MCHC: 33.6 g/dL (ref 32.0–36.0)
MCV: 93.7 fL (ref 80.0–100.0)
MONOS PCT: 10 %
MPV: 9.5 fL (ref 7.5–12.5)
Monocytes Absolute: 620 cells/uL (ref 200–950)
NEUTROS ABS: 3658 {cells}/uL (ref 1500–7800)
NEUTROS PCT: 59 %
PLATELETS: 303 10*3/uL (ref 140–400)
RBC: 3.65 MIL/uL — ABNORMAL LOW (ref 3.80–5.10)
RDW: 14.1 % (ref 11.0–15.0)
WBC: 6.2 10*3/uL (ref 3.8–10.8)

## 2016-01-24 LAB — POCT URINALYSIS DIP (MANUAL ENTRY)
Bilirubin, UA: NEGATIVE
GLUCOSE UA: NEGATIVE
Ketones, POC UA: NEGATIVE
Leukocytes, UA: NEGATIVE
Nitrite, UA: NEGATIVE
PH UA: 6
PROTEIN UA: NEGATIVE
SPEC GRAV UA: 1.02
UROBILINOGEN UA: 0.2

## 2016-01-24 LAB — LIPID PANEL
CHOL/HDL RATIO: 2.9 ratio (ref <=5.0)
CHOLESTEROL: 226 mg/dL — AB (ref 125–200)
HDL: 78 mg/dL (ref >=46)
LDL Cholesterol: 121 mg/dL (ref <130)
Triglycerides: 135 mg/dL (ref <150)
VLDL: 27 mg/dL (ref <30)
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
## 2016-01-24 NOTE — Patient Instructions (Signed)
     IF you received an x-ray today, you will receive an invoice from [facility]. Please contact [facility] at [phone] with questions or concerns regarding your invoice.   IF you received labwork today, you will receive an invoice from [facility]. Please contact [facility] at [phone] with questions or concerns regarding your invoice.   Our billing staff will not be able to assist you with questions regarding bills from these companies.  You will be contacted with the lab results as soon as they are available. The fastest way to get your results is to activate your My Chart account. Instructions are located on the last page of this paperwork. If you have not heard from us regarding the results in 2 weeks, please contact this office.     We recommend that you schedule a mammogram for breast cancer screening. Typically, you do not need a referral to do this. Please contact a local imaging center to schedule your mammogram.  [facility] - [phone]  *ask for the Radiology Department [facility]) - [phone] or [phone]  [facility] - [phone] [facility] - [phone] [facility] - [phone]  *ask for the Radiology Department [facility] - [phone]  *ask for the Radiology Department [facility] - [phone]  *ask for the Mammography Department [facility] - [phone]  

## 2016-01-25 LAB — TSH: TSH: 7.06 m[IU]/L — AB

## 2016-01-25 LAB — VITAMIN D 25 HYDROXY (VIT D DEFICIENCY, FRACTURES): VIT D 25 HYDROXY: 40 ng/mL (ref 30–100)

## 2016-01-28 ENCOUNTER — Telehealth: Payer: Self-pay | Admitting: Emergency Medicine

## 2016-01-28 NOTE — Telephone Encounter (Signed)
-----   Message from [place], [place] sent at 01/27/2016  1:44 PM EDT ----- Please ADD: free T3 and T4, dx: elevated TSH

## 2016-01-29 LAB — T4, FREE: FREE T4: 1.1 ng/dL (ref 0.8–1.8)

## 2016-01-29 LAB — T3, FREE: T3 FREE: 2.3 pg/mL (ref 2.3–4.2)

## 2016-02-06 ENCOUNTER — Ambulatory Visit
Admission: RE | Admit: 2016-02-06 | Discharge: 2016-02-06 | Disposition: A | Discharge status: Home / Self Care | Payer: Medicare Other | Source: Ambulatory Visit | Attending: Physician Assistant | Admitting: Physician Assistant

## 2016-02-06 DIAGNOSIS — Z1231 Encounter for screening mammogram for malignant neoplasm of breast: Secondary | ICD-10-CM

## 2016-02-17 ENCOUNTER — Other Ambulatory Visit: Payer: Self-pay | Admitting: Physician Assistant

## 2016-02-17 NOTE — Telephone Encounter (Signed)
Meds ordered this encounter  Medications  . clorazepate (TRANXENE) 7.5 MG tablet    Sig: TAKE 1 TABLET DAILY AS NEEDED.    Dispense:  30 tablet    Refill:  0  . zolpidem (AMBIEN) 5 MG tablet    Sig: TAKE 1 TABLET AT BEDTIME IF NEEDED FOR SLEEP.    Dispense:  30 tablet    Refill:  0

## 2016-03-29 DIAGNOSIS — H40053 Ocular hypertension, bilateral: Secondary | ICD-10-CM | POA: Diagnosis not present

## 2016-03-29 DIAGNOSIS — H04123 Dry eye syndrome of bilateral lacrimal glands: Secondary | ICD-10-CM | POA: Diagnosis not present

## 2016-03-29 DIAGNOSIS — Z961 Presence of intraocular lens: Secondary | ICD-10-CM | POA: Diagnosis not present

## 2016-03-29 DIAGNOSIS — H353132 Nonexudative age-related macular degeneration, bilateral, intermediate dry stage: Secondary | ICD-10-CM | POA: Diagnosis not present

## 2016-03-31 ENCOUNTER — Telehealth: Payer: Self-pay

## 2016-04-02 ENCOUNTER — Ambulatory Visit (HOSPITAL_COMMUNITY)
Admission: EM | Admit: 2016-04-02 | Discharge: 2016-04-02 | Disposition: A | Discharge status: Home / Self Care | Payer: Medicare Other | Attending: Family Medicine | Admitting: Family Medicine

## 2016-04-02 ENCOUNTER — Encounter (HOSPITAL_COMMUNITY): Payer: Self-pay | Admitting: *Deleted

## 2016-04-02 DIAGNOSIS — R220 Localized swelling, mass and lump, head: Secondary | ICD-10-CM | POA: Diagnosis not present

## 2016-04-02 DIAGNOSIS — B029 Zoster without complications: Secondary | ICD-10-CM

## 2016-04-02 MED ORDER — IPRATROPIUM-ALBUTEROL 0.5-2.5 (3) MG/3ML IN SOLN: 3.0000 mL | Freq: Once | RESPIRATORY_TRACT | Status: DC

## 2016-04-02 MED ORDER — VALACYCLOVIR HCL 1 G PO TABS: 1000.0000 mg | ORAL_TABLET | Freq: Three times a day (TID) | ORAL | 0 refills | Status: DC

## 2016-04-02 MED ORDER — DEXAMETHASONE SODIUM PHOSPHATE 10 MG/ML IJ SOLN: 10.0000 mg | Freq: Once | INTRAMUSCULAR | Status: DC

## 2016-04-02 MED ORDER — METHYLPREDNISOLONE ACETATE 40 MG/ML IJ SUSP: 40.0000 mg | Freq: Once | INTRAMUSCULAR | Status: DC
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
## 2016-04-02 NOTE — Discharge Instructions (Signed)
Be sure to see your ophthalmologist tomorrow. Take your medicines as directed. For any worsening follow-up with her primary care doctor or may return if needed.

## 2016-04-02 NOTE — ED Triage Notes (Signed)
Pt  Reports      Redness   And   Some      Swelling  To  The  l  Side    Of  Face    Pt  Symptoms   -     Pt   Reports      Seen  Recently  For  Eye  Problems  As   Sell  As  Dental  Problems  -       Taking  Amoxicillin

## 2016-04-13 ENCOUNTER — Other Ambulatory Visit: Payer: Self-pay | Admitting: Physician Assistant

## 2016-04-16 ENCOUNTER — Other Ambulatory Visit: Payer: Self-pay | Admitting: Physician Assistant

## 2016-04-16 NOTE — Telephone Encounter (Signed)
Meds ordered this encounter  Medications  . clorazepate (TRANXENE) 7.5 MG tablet    Sig: TAKE 1 TABLET DAILY AS NEEDED.    Dispense:  30 tablet    Refill:  0  . zolpidem (AMBIEN) 5 MG tablet    Sig: TAKE 1 TABLET AT BEDTIME IF NEEDED FOR SLEEP.    Dispense:  30 tablet    Refill:  0

## 2016-04-16 NOTE — Telephone Encounter (Signed)
Faxed

## 2016-06-04 ENCOUNTER — Telehealth: Payer: Self-pay

## 2016-06-04 NOTE — Telephone Encounter (Signed)
Msg is for Chelle,  Pt is locked out of her mychart and cannot see the message that was sent. She wants refills on her Clorazepate and Celexa. Also she doesn't know if she is up for her wellness exam or flu shot.   Please advise  [phone]

## 2016-06-06 ENCOUNTER — Other Ambulatory Visit: Payer: Self-pay | Admitting: Physician Assistant

## 2016-06-06 NOTE — Telephone Encounter (Signed)
12/2015 last ov

## 2016-06-07 NOTE — Telephone Encounter (Signed)
01/24/16 last ov.  I cannot que meds for refill? Future?

## 2016-06-07 NOTE — Telephone Encounter (Signed)
Meds ordered this encounter  Medications  . zolpidem (AMBIEN) 5 MG tablet    Sig: TAKE 1 TABLET AT BEDTIME IF NEEDED FOR SLEEP.    Dispense:  30 tablet    Refill:  0  . clorazepate (TRANXENE) 15 MG tablet    Sig: TAKE 1/2 TABLET ONCE A DAY AS NEEDED.    Dispense:  15 tablet    Refill:  0  . citalopram (CELEXA) 40 MG tablet    Sig: TAKE 1 & 1/2 TABLETS ONCE DAILY OR AS DIRECTED.    Dispense:  135 tablet    Refill:  0

## 2016-06-07 NOTE — Telephone Encounter (Signed)
LMOVM rx at front desk for pick up.

## 2016-06-08 NOTE — Telephone Encounter (Signed)
I refilled the tranxene and celexa yesterday.  Her wellness visit was in June, so she's not due for that again until 12/2016. She DOES need a flu vaccine, ASAP.

## 2016-06-09 NOTE — Telephone Encounter (Signed)
I have called patient to advise.  

## 2016-07-28 ENCOUNTER — Encounter (HOSPITAL_COMMUNITY): Payer: Self-pay | Admitting: Emergency Medicine

## 2016-07-28 ENCOUNTER — Ambulatory Visit (HOSPITAL_COMMUNITY)
Admission: EM | Admit: 2016-07-28 | Discharge: 2016-07-28 | Disposition: A | Discharge status: Home / Self Care | Payer: Medicare Other | Attending: Emergency Medicine | Admitting: Emergency Medicine

## 2016-07-28 DIAGNOSIS — M19011 Primary osteoarthritis, right shoulder: Secondary | ICD-10-CM | POA: Diagnosis not present

## 2016-07-28 DIAGNOSIS — M5030 Other cervical disc degeneration, unspecified cervical region: Secondary | ICD-10-CM | POA: Diagnosis not present

## 2016-07-28 DIAGNOSIS — F329 Major depressive disorder, single episode, unspecified: Secondary | ICD-10-CM | POA: Diagnosis not present

## 2016-07-28 DIAGNOSIS — Z87891 Personal history of nicotine dependence: Secondary | ICD-10-CM | POA: Diagnosis not present

## 2016-07-28 DIAGNOSIS — I10 Essential (primary) hypertension: Secondary | ICD-10-CM | POA: Insufficient documentation

## 2016-07-28 DIAGNOSIS — N289 Disorder of kidney and ureter, unspecified: Secondary | ICD-10-CM | POA: Diagnosis not present

## 2016-07-28 DIAGNOSIS — F419 Anxiety disorder, unspecified: Secondary | ICD-10-CM | POA: Insufficient documentation

## 2016-07-28 DIAGNOSIS — N390 Urinary tract infection, site not specified: Secondary | ICD-10-CM | POA: Insufficient documentation

## 2016-07-28 LAB — POCT URINALYSIS DIP (DEVICE)
BILIRUBIN URINE: NEGATIVE
Glucose, UA: NEGATIVE mg/dL
Ketones, ur: NEGATIVE mg/dL
NITRITE: NEGATIVE
PH: 6 (ref 5.0–8.0)
PROTEIN: NEGATIVE mg/dL
Specific Gravity, Urine: <=1.005 (ref 1.005–1.030)
Urobilinogen, UA: 0.2 mg/dL (ref 0.0–1.0)

## 2016-07-28 MED ORDER — CEPHALEXIN 500 MG PO CAPS: 500.0000 mg | ORAL_CAPSULE | Freq: Four times a day (QID) | ORAL | 0 refills | Status: DC
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
## 2016-07-28 NOTE — ED Triage Notes (Signed)
Here for UTI sx onset 1200 today associated w/pelivc pressure, urinary freq/urgency, hematuria  Denies fevers  Taking OTC AZOs and left over 500mg  Keflex  A&O x4... NAD

## 2016-07-28 NOTE — Discharge Instructions (Signed)
Take medication as directed. Drink plenty fluids /water and stay well-hydrated. You may take the AZO as directed for urinary tract symptoms.

## 2016-07-30 LAB — URINE CULTURE: CULTURE: NO GROWTH

## 2016-07-31 ENCOUNTER — Other Ambulatory Visit: Payer: Self-pay | Admitting: Physician Assistant

## 2016-07-31 NOTE — Telephone Encounter (Signed)
Last OV/annual 6/27, last RF 11/9

## 2016-08-19 ENCOUNTER — Other Ambulatory Visit: Payer: Self-pay | Admitting: Physician Assistant

## 2016-08-22 NOTE — Telephone Encounter (Signed)
Contacted patient.  Refilling for 1 month.  Advised her to make an appointment with you prior to the next refill.  She said she would.

## 2016-08-23 ENCOUNTER — Other Ambulatory Visit: Payer: Self-pay | Admitting: Physician Assistant

## 2016-08-25 NOTE — Telephone Encounter (Signed)
error 

## 2016-08-27 NOTE — Telephone Encounter (Signed)
Called to [facility] 

## 2016-09-24 ENCOUNTER — Ambulatory Visit (INDEPENDENT_AMBULATORY_CARE_PROVIDER_SITE_OTHER): Payer: Medicare Other | Admitting: Physician Assistant

## 2016-09-24 ENCOUNTER — Ambulatory Visit (INDEPENDENT_AMBULATORY_CARE_PROVIDER_SITE_OTHER): Payer: Medicare Other

## 2016-09-24 VITALS — BP 120/72 | HR 76 | Temp 98.3°F | Resp 18 | Ht 62.0 in | Wt 150.0 lb

## 2016-09-24 DIAGNOSIS — Z1211 Encounter for screening for malignant neoplasm of colon: Secondary | ICD-10-CM

## 2016-09-24 DIAGNOSIS — Z23 Encounter for immunization: Secondary | ICD-10-CM | POA: Diagnosis not present

## 2016-09-24 DIAGNOSIS — F418 Other specified anxiety disorders: Secondary | ICD-10-CM

## 2016-09-24 DIAGNOSIS — G8929 Other chronic pain: Secondary | ICD-10-CM

## 2016-09-24 DIAGNOSIS — M25551 Pain in right hip: Secondary | ICD-10-CM | POA: Diagnosis not present

## 2016-09-24 MED ORDER — MELOXICAM 7.5 MG PO TABS: 7.5000 mg | ORAL_TABLET | Freq: Every day | ORAL | 0 refills | Status: DC

## 2016-09-24 MED ORDER — ZOSTER VAC RECOMB ADJUVANTED 50 MCG/0.5ML IM SUSR: 50.0000 ug | Freq: Once | INTRAMUSCULAR | 0 refills | Status: AC

## 2016-09-24 MED ORDER — CLORAZEPATE DIPOTASSIUM 7.5 MG PO TABS: 7.5000 mg | ORAL_TABLET | Freq: Every day | ORAL | 0 refills | Status: DC | PRN

## 2016-09-24 NOTE — Progress Notes (Signed)
Patient ID: Allison Chavez, female    DOB: 1940-11-17, 76 y.o.   MRN: [id number]  PCP: Harrison Mons, PA-C  Chief Complaint  Patient presents with  . Medication Refill    clorazepate  . Hip Pain    right    Subjective:   Presents for evaluation of hip pain which has been there for about x2 months. The hip pain isn't related to truama or have any mechanism she can remember which precipitated the pain. It is a 2/10 pain scale at rest. Movement doesn't make it worse, unless she is having to walk uphill or at a faster than normal pace. When doing one of these activities she develops hip pain which is localized to her inguinal fold. She denies radiation of the pain or paresthesia. She also denies fever, chills, unexpected weight loss or fatigue.   She is also here for her medication refill of Clorazepate. She feels fine of the medication and doesn't need the dose adjusted.  Review of Systems See above   Patient Active Problem List   Diagnosis Date Noted  . Macular degeneration 08/22/2015  . Osteopenia 01/11/2015  . Hyperlipidemia 01/11/2015  . History of dislocation of shoulder 09/22/2014  . Insomnia 10/08/2013  . HTN (hypertension) 03/07/2012  . Depression with anxiety 03/07/2012  . Renal insufficiency, mild      Prior to Admission medications   Medication Sig Start Date End Date Taking? Authorizing Provider  citalopram (CELEXA) 40 MG tablet TAKE 1 & 1/2 TABLETS ONCE DAILY OR AS DIRECTED. 06/07/16  Yes Chelle Jeffery, PA-C  clorazepate (TRANXENE) 7.5 MG tablet TAKE 1 TABLET DAILY AS NEEDED. 04/16/16  Yes Chelle Jeffery, PA-C  IBUPROFEN PO Take 1 tablet by mouth every 6 (six) hours as needed (pain). Reported on 09/13/2015   Yes Historical Provider, MD  losartan-hydrochlorothiazide (HYZAAR) 50-12.5 MG tablet Take 1 tablet by mouth daily. 08/22/15  Yes Chelle Jeffery, PA-C  valACYclovir (VALTREX) 1000 MG tablet Take 1 tablet (1,000 mg total) by mouth 3 (three) times daily. 04/02/16   Yes Janne Napoleon, NP  zolpidem (AMBIEN) 5 MG tablet TAKE 1 TABLET AT BEDTIME IF NEEDED FOR SLEEP. 08/22/16  Yes Dorian Heckle English, PA  clorazepate (TRANXENE) 15 MG tablet TAKE 1/2 TABLET ONCE A DAY AS NEEDED. Patient not taking: Reported on 09/24/2016 07/31/16   Harrison Mons, PA-C  desonide (DESOWEN) 0.05 % cream Apply topically 2 (two) times daily. Patient not taking: Reported on 09/24/2016 03/25/13   Thao P Le, DO  triamcinolone cream (KENALOG) 0.1 % Apply 1 application topically 2 (two) times daily as needed (skin). Reported on 09/13/2015    Historical Provider, MD     Allergies  Allergen Reactions  . Ace Inhibitors     Cough   . Sulfa Antibiotics Hives  . Wellbutrin [Bupropion] Rash    Only with generic, immediate release       Objective:  Physical Exam  Constitutional: She is oriented to person, place, and time. Vital signs are normal. She appears well-developed and well-nourished. She is active.  Blood pressure 120/72, pulse 76, temperature 98.3 F (36.8 C), temperature source Oral, resp. rate 18, height 5\' 2"  (1.575 m), weight 150 lb (68 kg), SpO2 95 %.  Cardiovascular: Normal rate, regular rhythm and normal heart sounds.   Pulmonary/Chest: Effort normal and breath sounds normal.  Musculoskeletal:       Right hip: She exhibits tenderness (Deep point tenderness). She exhibits normal range of motion, normal strength, no bony tenderness,  no swelling, no crepitus and no deformity.  Neurological: She is alert and oriented to person, place, and time.  Skin: Skin is warm and dry.  Psychiatric: She has a normal mood and affect. Her speech is normal and behavior is normal. Judgment and thought content normal. Cognition and memory are normal.    Dg Hip Unilat W Or W/o Pelvis 2-3 Views Right  Result Date: 09/24/2016 CLINICAL DATA:  76 year old female with nontraumatic hip pain for the past 2 months. Initial encounter. EXAM: DG HIP (WITH OR WITHOUT PELVIS) 2-3V RIGHT COMPARISON:  None.  FINDINGS: Preservation of joint space without plain film evidence to suggest significant right hip degenerative changes or avascular necrosis. No fracture or dislocation. Minimal vascular calcifications. Mild degenerative changes lower lumbar spine most notable on the right at the L4-5 level. IMPRESSION: Preservation of joint space without significant right hip degenerative changes or findings of avascular necrosis. Mild degenerative changes lumbar spine most notable on the right at the L4-5 level. Electronically Signed   By: Genia Del M.D.   On: 09/24/2016 11:05     Assessment & Plan:   1. Hip pain, chronic, right Use the Meloxicam as needed for pain (perscribed) if acetaminophen isn't helping enough.  - DG HIP UNILAT W OR W/O PELVIS 2-3 VIEWS RIGHT; Future - meloxicam (MOBIC) 7.5 MG tablet; Take 1 tablet (7.5 mg total) by mouth daily.  Dispense: 30 tablet; Refill: 0  2. Depression with anxiety - clorazepate (TRANXENE) 7.5 MG tablet; Take 1 tablet (7.5 mg total) by mouth daily as needed.  Dispense: 30 tablet; Refill: 0  3. Need for prophylactic vaccination and inoculation against influenza - Flu Vaccine QUAD 36+ mos IM  4. Need for shingles vaccine Issued Shingrx prescription for her to fill. - Zoster Vac Recomb Adjuvanted ([place]) 50 MCG SUSR; Inject 50 mcg into the muscle once.  Dispense: 1 each; Refill: 0  5. Screening for colon cancer - Ambulatory referral to Gastroenterology

## 2016-09-24 NOTE — Progress Notes (Signed)
Patient ID: Allison Chavez, female    DOB: [birth year], 76 y.o.   MRN: [id number]  PCP: Harrison Mons, PA-C  Chief Complaint  Patient presents with  . Medication Refill    clorazepate  . Hip Pain    right    Subjective:   Presents for evaluation of RIGHT hip pain and refill of clorazepate.  Hip pain x 2 months. No recalled trauma or injury. Rates the pain 2/10 at rest. No worse with movement or weight bearing, unless she is walking UPHILL or faster than usual. The pain occurs in the RIGHT groin and does not radiate into the leg. No back pain. No urinary symptoms. No fatigue. No fever, chills. Would like to walk for exercise, but doesn't know anyone who is interested in going as slow as she needs to.  Clorazepate works well for her anxiety symptoms. She denies somnolence, dizziness.  Review of Systems No chest pain, SOB, HA, dizziness, vision change, N/V, diarrhea, constipation, dysuria, urinary urgency or frequency, myalgias, other arthralgias or rash.    Patient Active Problem List   Diagnosis Date Noted  . Macular degeneration 08/22/2015  . Osteopenia 01/11/2015  . Hyperlipidemia 01/11/2015  . History of dislocation of shoulder 09/22/2014  . Insomnia 10/08/2013  . HTN (hypertension) 03/07/2012  . Depression with anxiety 03/07/2012  . Renal insufficiency, mild      Prior to Admission medications   Medication Sig Start Date End Date Taking? Authorizing Provider  citalopram (CELEXA) 40 MG tablet TAKE 1 & 1/2 TABLETS ONCE DAILY OR AS DIRECTED. 06/07/16  Yes Anntionette Madkins, PA-C  clorazepate (TRANXENE) 7.5 MG tablet Take 1 tablet (7.5 mg total) by mouth daily as needed. 09/24/16  Yes Caidance Sybert, PA-C  IBUPROFEN PO Take 1 tablet by mouth every 6 (six) hours as needed (pain). Reported on 09/13/2015   Yes Historical Provider, MD  losartan-hydrochlorothiazide (HYZAAR) 50-12.5 MG tablet Take 1 tablet by mouth daily. 08/22/15  Yes Chelsee Hosie, PA-C  valACYclovir  (VALTREX) 1000 MG tablet Take 1 tablet (1,000 mg total) by mouth 3 (three) times daily. 04/02/16  Yes Janne Napoleon, NP  zolpidem (AMBIEN) 5 MG tablet TAKE 1 TABLET AT BEDTIME IF NEEDED FOR SLEEP. 08/22/16  Yes Dorian Heckle English, PA  desonide (DESOWEN) 0.05 % cream Apply topically 2 (two) times daily. Patient not taking: Reported on 09/24/2016 03/25/13   Thao P Le, DO  triamcinolone cream (KENALOG) 0.1 % Apply 1 application topically 2 (two) times daily as needed (skin). Reported on 09/13/2015    Historical Provider, MD  Zoster Vac Recomb Adjuvanted (SHINGRIX) 50 MCG SUSR Inject 50 mcg into the muscle once. 09/24/16 09/24/16  Harrison Mons, PA-C     Allergies  Allergen Reactions  . Ace Inhibitors     Cough   . Sulfa Antibiotics Hives  . Wellbutrin [Bupropion] Rash    Only with generic, immediate release       Objective:  Physical Exam  Constitutional: She is oriented to person, place, and time. She appears well-developed and well-nourished. She is active and cooperative. No distress.  BP 120/72 (BP Location: Right Arm, Patient Position: Sitting, Cuff Size: Small)   Pulse 76   Temp 98.3 F (36.8 C) (Oral)   Resp 18   Ht 5\' 2"  (1.575 m)   Wt 150 lb (68 kg)   SpO2 95%   BMI 27.44 kg/m   HENT:  Head: Normocephalic and atraumatic.  Right Ear: Hearing normal.  Left Ear: Hearing normal.  Eyes: Conjunctivae are normal. No scleral icterus.  Neck: Normal range of motion. Neck supple. No thyromegaly present.  Cardiovascular: Normal rate, regular rhythm and normal heart sounds.   Pulses:      Radial pulses are 2+ on the right side, and 2+ on the left side.  Pulmonary/Chest: Effort normal and breath sounds normal.  Musculoskeletal:       Right hip: She exhibits tenderness. She exhibits normal range of motion, normal strength, no bony tenderness, no swelling, no crepitus, no deformity and no laceration.       Left hip: Normal.       Lumbar back: Normal.  Lymphadenopathy:       Head (right  side): No tonsillar, no preauricular, no posterior auricular and no occipital adenopathy present.       Head (left side): No tonsillar, no preauricular, no posterior auricular and no occipital adenopathy present.    She has no cervical adenopathy.       Right: No supraclavicular adenopathy present.       Left: No supraclavicular adenopathy present.  Neurological: She is alert and oriented to person, place, and time. No sensory deficit.  Skin: Skin is warm, dry and intact. No rash noted. No cyanosis or erythema. Nails show no clubbing.  Psychiatric: She has a normal mood and affect. Her speech is normal and behavior is normal.       Dg Hip Unilat W Or W/o Pelvis 2-3 Views Right  Result Date: 09/24/2016 CLINICAL DATA:  76 year old female with nontraumatic hip pain for the past 2 months. Initial encounter. EXAM: DG HIP (WITH OR WITHOUT PELVIS) 2-3V RIGHT COMPARISON:  None. FINDINGS: Preservation of joint space without plain film evidence to suggest significant right hip degenerative changes or avascular necrosis. No fracture or dislocation. Minimal vascular calcifications. Mild degenerative changes lower lumbar spine most notable on the right at the L4-5 level. IMPRESSION: Preservation of joint space without significant right hip degenerative changes or findings of avascular necrosis. Mild degenerative changes lumbar spine most notable on the right at the L4-5 level. Electronically Signed   By: Genia Del M.D.   On: 09/24/2016 11:05       Assessment & Plan:   1. Hip pain, chronic, right Acetaminophen as needed, with meloxicam if ineffective. If still painful and limiting her activity, will refer for specialty evaluation. - DG HIP UNILAT W OR W/O PELVIS 2-3 VIEWS RIGHT; Future - meloxicam (MOBIC) 7.5 MG tablet; Take 1 tablet (7.5 mg total) by mouth daily.  Dispense: 30 tablet; Refill: 0  2. Depression with anxiety Well controlled.  Continue citalopram and PRN clorazepate.  - clorazepate  (TRANXENE) 7.5 MG tablet; Take 1 tablet (7.5 mg total) by mouth daily as needed.  Dispense: 30 tablet; Refill: 0  3. Need for prophylactic vaccination and inoculation against influenza - Flu Vaccine QUAD 36+ mos IM  4. Need for shingles vaccine - Zoster Vac Recomb Adjuvanted ([place]) 50 MCG SUSR; Inject 50 mcg into the muscle once.  Dispense: 1 each; Refill: 0  5. Screening for colon cancer - Ambulatory referral to Gastroenterology   Fara Chute, PA-C Physician Assistant-Certified [facility]

## 2016-09-24 NOTE — Patient Instructions (Addendum)
Use acetaminophen 1-2 times daily for the hip pain. When that isn't enough, use the meloxicam. Do not use ibuprofen AND meloxicam.    IF you received an x-ray today, you will receive an invoice from [facility]. Please contact [facility] at [phone] with questions or concerns regarding your invoice.   IF you received labwork today, you will receive an invoice from [facility]. Please contact [facility] at [phone] with questions or concerns regarding your invoice.   Our billing staff will not be able to assist you with questions regarding bills from these companies.  You will be contacted with the lab results as soon as they are available. The fastest way to get your results is to activate your My Chart account. Instructions are located on the last page of this paperwork. If you have not heard from [place] regarding the results in 2 weeks, please contact this office.

## 2016-10-24 ENCOUNTER — Other Ambulatory Visit: Payer: Self-pay | Admitting: Physician Assistant

## 2016-11-07 ENCOUNTER — Other Ambulatory Visit: Payer: Self-pay | Admitting: Physician Assistant

## 2016-11-11 NOTE — Telephone Encounter (Signed)
Patient notified via My Chart.  Meds ordered this encounter  Medications  . zolpidem (AMBIEN) 5 MG tablet    Sig: TAKE 1 TABLET AT BEDTIME IF NEEDED FOR SLEEP.    Dispense:  30 tablet    Refill:  0

## 2016-11-12 ENCOUNTER — Telehealth: Payer: Self-pay | Admitting: Physician Assistant

## 2016-11-12 NOTE — Telephone Encounter (Signed)
Addressed 11/11/16

## 2016-11-12 NOTE — Telephone Encounter (Signed)
Pharmacy calling requesting refill of zolpidem 5 MG. Pharmacy callback number [phone].

## 2016-11-12 NOTE — Telephone Encounter (Signed)
Called to [facility] 

## 2016-11-14 ENCOUNTER — Other Ambulatory Visit: Payer: Self-pay | Admitting: Physician Assistant

## 2016-11-14 DIAGNOSIS — F418 Other specified anxiety disorders: Secondary | ICD-10-CM

## 2016-11-16 NOTE — Telephone Encounter (Signed)
Called to [facility] 

## 2016-11-20 ENCOUNTER — Other Ambulatory Visit: Payer: Self-pay | Admitting: Physician Assistant

## 2016-11-20 DIAGNOSIS — I10 Essential (primary) hypertension: Secondary | ICD-10-CM

## 2016-12-22 ENCOUNTER — Telehealth: Payer: Self-pay | Admitting: Physician Assistant

## 2016-12-22 NOTE — Telephone Encounter (Signed)
Pt is needing to talk with someone about her colonoscopy procedure   Best number [phone]

## 2016-12-25 ENCOUNTER — Other Ambulatory Visit: Payer: Self-pay | Admitting: Physician Assistant

## 2016-12-25 NOTE — Telephone Encounter (Signed)
[email or web address]

## 2016-12-26 NOTE — Telephone Encounter (Signed)
Meds ordered this encounter  Medications  . zolpidem (AMBIEN) 5 MG tablet    Sig: TAKE 1 TABLET AT BEDTIME IF NEEDED FOR SLEEP.    Dispense:  30 tablet    Refill:  0

## 2016-12-26 NOTE — Telephone Encounter (Signed)
11/11/16 last refill 08/2016 last ov

## 2016-12-26 NOTE — Telephone Encounter (Signed)
Called to [facility] 

## 2017-01-08 DIAGNOSIS — Z1211 Encounter for screening for malignant neoplasm of colon: Secondary | ICD-10-CM | POA: Diagnosis not present

## 2017-01-08 DIAGNOSIS — Z800 Family history of malignant neoplasm of digestive organs: Secondary | ICD-10-CM | POA: Diagnosis not present

## 2017-01-08 DIAGNOSIS — K5730 Diverticulosis of large intestine without perforation or abscess without bleeding: Secondary | ICD-10-CM

## 2017-01-08 HISTORY — DX: Diverticulosis of large intestine without perforation or abscess without bleeding: K57.30

## 2017-01-15 ENCOUNTER — Encounter: Payer: Self-pay | Admitting: Physician Assistant

## 2017-01-15 DIAGNOSIS — K5730 Diverticulosis of large intestine without perforation or abscess without bleeding: Secondary | ICD-10-CM | POA: Insufficient documentation

## 2017-01-16 ENCOUNTER — Other Ambulatory Visit: Payer: Self-pay | Admitting: Physician Assistant

## 2017-01-16 DIAGNOSIS — F418 Other specified anxiety disorders: Secondary | ICD-10-CM

## 2017-01-16 NOTE — Telephone Encounter (Signed)
Patient notified via My Chart.  Meds ordered this encounter  Medications  . clorazepate (TRANXENE) 7.5 MG tablet    Sig: TAKE 1 TABLET DAILY AS NEEDED.    Dispense:  30 tablet    Refill:  0

## 2017-01-17 NOTE — Telephone Encounter (Signed)
Script called in to [place]

## 2017-01-22 ENCOUNTER — Encounter: Payer: Self-pay | Admitting: Physician Assistant

## 2017-01-22 ENCOUNTER — Ambulatory Visit (INDEPENDENT_AMBULATORY_CARE_PROVIDER_SITE_OTHER): Payer: Medicare Other | Admitting: Physician Assistant

## 2017-01-22 VITALS — BP 121/77 | HR 78 | Temp 97.7°F | Resp 18 | Ht 62.0 in | Wt 155.4 lb

## 2017-01-22 DIAGNOSIS — M8580 Other specified disorders of bone density and structure, unspecified site: Secondary | ICD-10-CM

## 2017-01-22 DIAGNOSIS — R946 Abnormal results of thyroid function studies: Secondary | ICD-10-CM | POA: Diagnosis not present

## 2017-01-22 DIAGNOSIS — E2839 Other primary ovarian failure: Secondary | ICD-10-CM

## 2017-01-22 DIAGNOSIS — F418 Other specified anxiety disorders: Secondary | ICD-10-CM | POA: Diagnosis not present

## 2017-01-22 DIAGNOSIS — I10 Essential (primary) hypertension: Secondary | ICD-10-CM

## 2017-01-22 DIAGNOSIS — E785 Hyperlipidemia, unspecified: Secondary | ICD-10-CM

## 2017-01-22 DIAGNOSIS — N898 Other specified noninflammatory disorders of vagina: Secondary | ICD-10-CM

## 2017-01-22 DIAGNOSIS — R7989 Other specified abnormal findings of blood chemistry: Secondary | ICD-10-CM

## 2017-01-22 DIAGNOSIS — M25551 Pain in right hip: Secondary | ICD-10-CM

## 2017-01-22 DIAGNOSIS — G8929 Other chronic pain: Secondary | ICD-10-CM

## 2017-01-22 DIAGNOSIS — N289 Disorder of kidney and ureter, unspecified: Secondary | ICD-10-CM

## 2017-01-22 DIAGNOSIS — Z0000 Encounter for general adult medical examination without abnormal findings: Secondary | ICD-10-CM | POA: Diagnosis not present

## 2017-01-22 LAB — POCT URINALYSIS DIP (MANUAL ENTRY)
BILIRUBIN UA: NEGATIVE mg/dL
Bilirubin, UA: NEGATIVE
Glucose, UA: NEGATIVE mg/dL
NITRITE UA: NEGATIVE
PROTEIN UA: NEGATIVE mg/dL
Spec Grav, UA: 1.01 (ref 1.010–1.025)
UROBILINOGEN UA: 0.2 U/dL
pH, UA: 5 (ref 5.0–8.0)

## 2017-01-22 MED ORDER — MELOXICAM 7.5 MG PO TABS: 7.5000 mg | ORAL_TABLET | Freq: Every day | ORAL | 1 refills | Status: DC

## 2017-01-22 NOTE — Progress Notes (Signed)
Subjective:    Patient ID: Allison Chavez, female    DOB: 11-Jul-1941, 76 y.o.   MRN: [id number] PCP: Harrison Mons, PA-C  Patient Care Team: Harrison Mons, PA-C as PCP - General (Physician Assistant) Rolm Bookbinder, MD as Consulting Physician (Dermatology) Clent Jacks, MD as Consulting Physician (Ophthalmology) Clarene Essex, MD as Consulting Physician (Gastroenterology) Dr. Zadie Rhine- retinologist, and endocrinologist.    HPI: 76 y/o F presents today for her Medicare annual wellness exam.  Plans to go to [facility] this summer. Her son just recently got married.  Exercise- not often. Is thinking about re-joining the [employer] Diet- Makes healthy choices and drinks a lot of water.  Sleep- 8 hours a night Colonoscopy [facility]. Few weeks ago.  Mammogram in 01/2016 Bone density scan in 12/2014  Does complain of some vaginal dryness, and is concerned with how to best clean her vagina.   Hypertension: Currently taking Losartan-HCTZ. Denies any headaches, dizziness. Beginning of macular degeneration- being followed by retinologist and ophthlamologist   Hyperlipidemia: Currently not taking any lipid lowering medications.   Depression and anxiety: Feels that her symptoms are pretty much controlled with her medication- Celexa and Clorazepate. Although at times she forgets to take the Clorazepate when it would be helpful.   Advanced directives:  Has been to work shops and thought through it but has not filled the proper paperwork.   Living situation: One story house with 5-6 stairs.   Urinary incontinence:  Denies any loss of control, or stress leakage. Admits to some urinary urgency.   Reviewed the medicare preventative services with patient.   Patient Active Problem List   Diagnosis Date Noted  . Sigmoid diverticulosis 01/15/2017  . Macular degeneration 08/22/2015  . Osteopenia 01/11/2015  . Hyperlipidemia 01/11/2015  . History of dislocation of shoulder 09/22/2014  . Insomnia  10/08/2013  . HTN (hypertension) 03/07/2012  . Depression with anxiety 03/07/2012  . Renal insufficiency, mild    Past Medical History:  Diagnosis Date  . Anxiety   . Cataract   . Depression   . DJD (degenerative joint disease) of cervical spine   . DJD of shoulder, right   . Hypertension   . Pneumonia    as a child  . Renal insufficiency, mild    Prior to Admission medications   Medication Sig Start Date End Date Taking? Authorizing Provider  citalopram (CELEXA) 40 MG tablet TAKE 1 & 1/2 TABLETS ONCE DAILY OR AS DIRECTED. 10/25/16   Gale Journey, Damaris Hippo, PA-C  clorazepate (TRANXENE) 7.5 MG tablet TAKE 1 TABLET DAILY AS NEEDED. 01/16/17   Jacqulynn Cadet, Chelle, PA-C  desonide (DESOWEN) 0.05 % cream Apply topically 2 (two) times daily. Patient not taking: Reported on 09/24/2016 03/25/13   Le, Thao P, DO  IBUPROFEN PO Take 1 tablet by mouth every 6 (six) hours as needed (pain). Reported on 09/13/2015    [provider]  losartan-hydrochlorothiazide (HYZAAR) 50-12.5 MG tablet TAKE 1 TABLET DAILY. 11/20/16   Harrison Mons, PA-C  meloxicam (MOBIC) 7.5 MG tablet Take 1 tablet (7.5 mg total) by mouth daily. 09/24/16   Harrison Mons, PA-C  triamcinolone cream (KENALOG) 0.1 % Apply 1 application topically 2 (two) times daily as needed (skin). Reported on 09/13/2015    [provider]  valACYclovir (VALTREX) 1000 MG tablet Take 1 tablet (1,000 mg total) by mouth 3 (three) times daily. 04/02/16   Janne Napoleon, NP  zolpidem (AMBIEN) 5 MG tablet TAKE 1 TABLET AT BEDTIME IF NEEDED FOR SLEEP. 12/26/16  Harrison Mons, PA-C   Allergies  Allergen Reactions  . Ace Inhibitors     Cough   . Sulfa Antibiotics Hives  . Wellbutrin [Bupropion] Rash    Only with generic, immediate release     Review of Systems  Constitutional: Negative.  Negative for chills and fever.  HENT: Positive for rhinorrhea. Negative for congestion and postnasal drip.   Eyes: Negative.  Negative for visual disturbance.    Respiratory: Negative.  Negative for cough, chest tightness and stridor.   Cardiovascular: Negative.  Negative for chest pain.  Gastrointestinal: Negative for abdominal pain, blood in stool, constipation, diarrhea, nausea and vomiting.  Endocrine: Negative.   Genitourinary: Positive for urgency. Negative for dysuria and frequency.  Musculoskeletal: Positive for arthralgias.  Skin: Negative.   Allergic/Immunologic: Negative.   Neurological: Negative.  Negative for dizziness, weakness, light-headedness, numbness and headaches.  Hematological: Negative.   Psychiatric/Behavioral: Negative.  Negative for agitation and sleep disturbance.       Objective:   Physical Exam  Constitutional: She appears well-developed and well-nourished. No distress.  BP 121/77 (BP Location: Right Arm, Patient Position: Sitting, Cuff Size: Normal)   Pulse 78   Temp 97.7 F (36.5 C) (Oral)   Resp 18   Ht 5\' 2"  (1.575 m)   Wt 155 lb 6.4 oz (70.5 kg)   SpO2 96%   BMI 28.42 kg/m    HENT:  Head: Normocephalic and atraumatic.  Eyes: Conjunctivae and EOM are normal. Pupils are equal, round, and reactive to light.  Neck: Normal range of motion. Neck supple. No thyromegaly present.  Cardiovascular: Normal rate, regular rhythm and intact distal pulses.   Pulmonary/Chest: Effort normal and breath sounds normal.  Lymphadenopathy:    She has no cervical adenopathy.  Skin: She is not diaphoretic.       Assessment & Plan:  1. Medicare annual wellness visit, subsequent  2. Essential hypertension - POCT urinalysis dipstick - CBC with Differential/Platelet  3. Depression with anxiety  4. Hyperlipidemia, unspecified hyperlipidemia type - Lipid panel - Comprehensive metabolic panel  5. Elevated TSH - TSH - T4, free  6. Osteopenia, unspecified location  7. Estrogen deficiency - DG Bone Density; Future  8. Hip pain, chronic, right - meloxicam (MOBIC) 7.5 MG tablet; Take 1 tablet (7.5 mg total) by  mouth daily.  Dispense: 90 tablet; Refill: 1  9. Vaginal odor
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
## 2017-01-22 NOTE — Patient Instructions (Signed)
     IF you received an x-ray today, you will receive an invoice from [facility]. Please contact [facility] at [phone] with questions or concerns regarding your invoice.   IF you received labwork today, you will receive an invoice from [facility]. Please contact [facility] at [phone] with questions or concerns regarding your invoice.   Our billing staff will not be able to assist you with questions regarding bills from these companies.  You will be contacted with the lab results as soon as they are available. The fastest way to get your results is to activate your My Chart account. Instructions are located on the last page of this paperwork. If you have not heard from us regarding the results in 2 weeks, please contact this office.     

## 2017-01-23 ENCOUNTER — Encounter: Payer: Self-pay | Admitting: Physician Assistant

## 2017-01-23 LAB — COMPREHENSIVE METABOLIC PANEL
ALK PHOS: 89 IU/L (ref 39–117)
ALT: 11 IU/L (ref 0–32)
AST: 17 IU/L (ref 0–40)
Albumin/Globulin Ratio: 1.8 (ref 1.2–2.2)
Albumin: 4.5 g/dL (ref 3.5–4.8)
BUN/Creatinine Ratio: 22 (ref 12–28)
BUN: 29 mg/dL — ABNORMAL HIGH (ref 8–27)
Bilirubin Total: 0.2 mg/dL (ref 0.0–1.2)
CALCIUM: 9.5 mg/dL (ref 8.7–10.3)
CO2: 23 mmol/L (ref 20–29)
CREATININE: 1.3 mg/dL — AB (ref 0.57–1.00)
Chloride: 98 mmol/L (ref 96–106)
GFR calc Af Amer: 47 mL/min/{1.73_m2} — ABNORMAL LOW (ref >59)
GFR, EST NON AFRICAN AMERICAN: 41 mL/min/{1.73_m2} — AB (ref >59)
GLOBULIN, TOTAL: 2.5 g/dL (ref 1.5–4.5)
Glucose: 96 mg/dL (ref 65–99)
POTASSIUM: 4 mmol/L (ref 3.5–5.2)
SODIUM: 135 mmol/L (ref 134–144)
Total Protein: 7 g/dL (ref 6.0–8.5)

## 2017-01-23 LAB — CBC WITH DIFFERENTIAL/PLATELET
Basophils Absolute: 0.1 10*3/uL (ref 0.0–0.2)
Basos: 1 %
EOS (ABSOLUTE): 0.2 10*3/uL (ref 0.0–0.4)
Eos: 2 %
Hematocrit: 35.1 % (ref 34.0–46.6)
Hemoglobin: 11.8 g/dL (ref 11.1–15.9)
IMMATURE GRANS (ABS): 0 10*3/uL (ref 0.0–0.1)
IMMATURE GRANULOCYTES: 1 %
LYMPHS: 33 %
Lymphocytes Absolute: 2.4 10*3/uL (ref 0.7–3.1)
MCH: 33.2 pg — ABNORMAL HIGH (ref 26.6–33.0)
MCHC: 33.6 g/dL (ref 31.5–35.7)
MCV: 99 fL — ABNORMAL HIGH (ref 79–97)
MONOS ABS: 0.8 10*3/uL (ref 0.1–0.9)
Monocytes: 10 %
NEUTROS PCT: 53 %
Neutrophils Absolute: 3.8 10*3/uL (ref 1.4–7.0)
PLATELETS: 259 10*3/uL (ref 150–379)
RBC: 3.55 x10E6/uL — AB (ref 3.77–5.28)
RDW: 14.4 % (ref 12.3–15.4)
WBC: 7.2 10*3/uL (ref 3.4–10.8)

## 2017-01-23 LAB — LIPID PANEL
Chol/HDL Ratio: 2.6 ratio (ref 0.0–4.4)
Cholesterol, Total: 235 mg/dL — ABNORMAL HIGH (ref 100–199)
HDL: 90 mg/dL (ref >39)
LDL Calculated: 119 mg/dL — ABNORMAL HIGH (ref 0–99)
Triglycerides: 130 mg/dL (ref 0–149)
VLDL Cholesterol Cal: 26 mg/dL (ref 5–40)

## 2017-01-23 LAB — TSH: TSH: 6.6 u[IU]/mL — AB (ref 0.450–4.500)

## 2017-01-23 LAB — T4, FREE: FREE T4: 1.03 ng/dL (ref 0.82–1.77)

## 2017-01-23 NOTE — Assessment & Plan Note (Signed)
Well controlled 

## 2017-01-23 NOTE — Assessment & Plan Note (Signed)
Await labs. Adjust regimen as indicated by results.  

## 2017-01-23 NOTE — Assessment & Plan Note (Signed)
Update DEXA.  

## 2017-01-23 NOTE — Assessment & Plan Note (Signed)
Controlled. Continue current treatment. 

## 2017-01-28 MED ORDER — ATORVASTATIN CALCIUM 10 MG PO TABS: 10.0000 mg | ORAL_TABLET | Freq: Every day | ORAL | 3 refills | Status: DC

## 2017-01-28 NOTE — Addendum Note (Signed)
Addended by: Fara Chute on: 01/28/2017 10:15 PM   Modules accepted: Orders

## 2017-02-21 ENCOUNTER — Other Ambulatory Visit: Payer: Self-pay | Admitting: Physician Assistant

## 2017-02-22 NOTE — Telephone Encounter (Signed)
Please advise re: refill Ambien

## 2017-02-23 ENCOUNTER — Other Ambulatory Visit: Payer: Self-pay | Admitting: Physician Assistant

## 2017-02-23 ENCOUNTER — Telehealth: Payer: Self-pay

## 2017-02-23 NOTE — Telephone Encounter (Signed)
Meds ordered this encounter  Medications  . zolpidem (AMBIEN) 5 MG tablet    Sig: TAKE 1 TABLET AT BEDTIME IF NEEDED FOR SLEEP.    Dispense:  30 tablet    Refill:  0

## 2017-02-23 NOTE — Telephone Encounter (Signed)
Called pt and left a message stating Rx is ready for pickup in the [facility] pickup box.

## 2017-03-11 ENCOUNTER — Other Ambulatory Visit: Payer: Self-pay | Admitting: Physician Assistant

## 2017-03-13 ENCOUNTER — Other Ambulatory Visit: Payer: Self-pay | Admitting: Physician Assistant

## 2017-03-14 NOTE — Telephone Encounter (Signed)
This was done on 7/28 - should not be needed yet.

## 2017-03-22 ENCOUNTER — Telehealth: Payer: Self-pay | Admitting: Physician Assistant

## 2017-03-22 DIAGNOSIS — F418 Other specified anxiety disorders: Secondary | ICD-10-CM

## 2017-03-22 NOTE — Telephone Encounter (Signed)
Please advise 

## 2017-03-23 NOTE — Telephone Encounter (Signed)
Meds ordered this encounter  Medications  . zolpidem (AMBIEN) 5 MG tablet    Sig: TAKE 1 TABLET AT BEDTIME IF NEEDED FOR SLEEP.    Dispense:  30 tablet    Refill:  0  . clorazepate (TRANXENE) 7.5 MG tablet    Sig: TAKE 1 TABLET DAILY AS NEEDED.    Dispense:  30 tablet    Refill:  0

## 2017-04-03 DIAGNOSIS — Z961 Presence of intraocular lens: Secondary | ICD-10-CM | POA: Diagnosis not present

## 2017-04-03 DIAGNOSIS — H353131 Nonexudative age-related macular degeneration, bilateral, early dry stage: Secondary | ICD-10-CM | POA: Diagnosis not present

## 2017-04-03 DIAGNOSIS — H04123 Dry eye syndrome of bilateral lacrimal glands: Secondary | ICD-10-CM | POA: Diagnosis not present

## 2017-04-03 DIAGNOSIS — H40053 Ocular hypertension, bilateral: Secondary | ICD-10-CM | POA: Diagnosis not present

## 2017-04-19 NOTE — Telephone Encounter (Signed)
Please advise 

## 2017-04-19 NOTE — Telephone Encounter (Signed)
Zolpidem refill req.  [place]

## 2017-04-22 MED ORDER — ZOLPIDEM TARTRATE 5 MG PO TABS: ORAL_TABLET | ORAL | 0 refills | Status: DC

## 2017-04-22 NOTE — Telephone Encounter (Signed)
Meds ordered this encounter  Medications  . zolpidem (AMBIEN) 5 MG tablet    Sig: TAKE 1 TABLET AT BEDTIME IF NEEDED FOR SLEEP.    Dispense:  30 tablet    Refill:  0    Order Specific Question:   Supervising Provider    Answer:   Brigitte Pulse, EVA N [[id number]]

## 2017-04-23 ENCOUNTER — Telehealth: Payer: Self-pay | Admitting: *Deleted

## 2017-04-23 NOTE — Telephone Encounter (Signed)
Prescription sent

## 2017-04-24 ENCOUNTER — Other Ambulatory Visit: Payer: Self-pay | Admitting: Physician Assistant

## 2017-04-24 DIAGNOSIS — F418 Other specified anxiety disorders: Secondary | ICD-10-CM

## 2017-04-25 NOTE — Telephone Encounter (Signed)
Meds ordered this encounter  Medications  . clorazepate (TRANXENE) 7.5 MG tablet    Sig: TAKE 1 TABLET DAILY AS NEEDED.    Dispense:  30 tablet    Refill:  0

## 2017-05-29 DIAGNOSIS — L304 Erythema intertrigo: Secondary | ICD-10-CM | POA: Diagnosis not present

## 2017-05-29 DIAGNOSIS — L821 Other seborrheic keratosis: Secondary | ICD-10-CM | POA: Diagnosis not present

## 2017-05-29 DIAGNOSIS — L308 Other specified dermatitis: Secondary | ICD-10-CM | POA: Diagnosis not present

## 2017-05-29 DIAGNOSIS — L853 Xerosis cutis: Secondary | ICD-10-CM | POA: Diagnosis not present

## 2017-06-24 ENCOUNTER — Other Ambulatory Visit: Payer: Self-pay | Admitting: Physician Assistant

## 2017-06-24 DIAGNOSIS — F418 Other specified anxiety disorders: Secondary | ICD-10-CM

## 2017-06-24 NOTE — Telephone Encounter (Signed)
Tranxene  Refill

## 2017-06-26 ENCOUNTER — Other Ambulatory Visit: Payer: Self-pay | Admitting: Physician Assistant

## 2017-06-26 NOTE — Telephone Encounter (Signed)
Rx sent electronically.  Meds ordered this encounter  Medications  . clorazepate (TRANXENE) 7.5 MG tablet    Sig: TAKE 1 TABLET DAILY AS NEEDED.    Dispense:  30 tablet    Refill:  0

## 2017-06-27 ENCOUNTER — Telehealth: Payer: Self-pay | Admitting: Physician Assistant

## 2017-06-27 MED ORDER — ZOLPIDEM TARTRATE 5 MG PO TABS: ORAL_TABLET | ORAL | 0 refills | Status: DC

## 2017-06-27 NOTE — Telephone Encounter (Signed)
Request for controlled substance 

## 2017-06-27 NOTE — Telephone Encounter (Signed)
Rx sent electronically.  Meds ordered this encounter  Medications  . zolpidem (AMBIEN) 5 MG tablet    Sig: TAKE 1 TABLET AT BEDTIME IF NEEDED FOR SLEEP.    Dispense:  30 tablet    Refill:  0    Order Specific Question:   Supervising Provider    Answer:   Brigitte Pulse, EVA N [[id number]]

## 2017-06-27 NOTE — Telephone Encounter (Signed)
Copied from [place] [id number]. Topic: Quick Communication - See Telephone Encounter >> Jun 27, 2017 10:29 AM Conception Chancy, NT wrote: CRM for notification. See Telephone encounter for:  06/27/17.  Pt states she has contacted the pharmacy about the refill. She states she leaves out of town in the morning and would like to have the RX before then

## 2017-06-27 NOTE — Telephone Encounter (Signed)
Ambien request

## 2017-06-27 NOTE — Telephone Encounter (Signed)
Patient presents in clinic today for Ambien refill.

## 2017-06-27 NOTE — Telephone Encounter (Signed)
Called pt to let her know that [facility] has called stating that they have been trying to contact her since July to sch an appt a referral was sent over in July gave pt [facility] phone number on her vm pt did not answer

## 2017-07-18 ENCOUNTER — Telehealth: Payer: Self-pay

## 2017-07-18 NOTE — Telephone Encounter (Signed)
Phone call to patient's daughter Chrys Racer to gather more information.   She states she wanted to share some information with primary care provider for Nalina's upcoming appointment 07/19/17.   Chrys Racer has concerns about Evette's memory. She states, "When she was flying here [facility]), she called me to the bathroom and insisting she was already in [place] while she was in [place].  Over the summer she was going to visit my brother in a place he rented in the [place]. She ended up driving three hours in the wrong direction without realizing what had happened. When I was growing up, we were able to take long road trips all the time, she was very capable.   I don't know if it's because she lives by herself and she lives in her own head, I figured someone else might be able to assess what's going on and gently encourage her to get some sort of baseline test to see what's going on.   I just wanted to relay that to her and have that information".   Provider, Juluis Rainier.

## 2017-07-18 NOTE — Telephone Encounter (Signed)
Copied from [place] [id number]. Topic: General - Other >> Jul 18, 2017  1:03 PM Ivar Drape wrote: Reason for CRM: Patient daughter, Clemencia Course [phone] wanted to convey some personal information about her mom to the PCP.  Please call.

## 2017-07-19 ENCOUNTER — Encounter: Payer: Self-pay | Admitting: Physician Assistant

## 2017-07-19 ENCOUNTER — Ambulatory Visit: Payer: Medicare Other | Admitting: Physician Assistant

## 2017-07-19 ENCOUNTER — Ambulatory Visit (INDEPENDENT_AMBULATORY_CARE_PROVIDER_SITE_OTHER): Payer: Medicare Other | Admitting: Physician Assistant

## 2017-07-19 ENCOUNTER — Other Ambulatory Visit: Payer: Self-pay

## 2017-07-19 VITALS — BP 140/80 | HR 81 | Temp 99.2°F | Resp 18 | Ht 62.0 in | Wt 169.8 lb

## 2017-07-19 DIAGNOSIS — I10 Essential (primary) hypertension: Secondary | ICD-10-CM | POA: Diagnosis not present

## 2017-07-19 DIAGNOSIS — Z23 Encounter for immunization: Secondary | ICD-10-CM

## 2017-07-19 DIAGNOSIS — G4700 Insomnia, unspecified: Secondary | ICD-10-CM

## 2017-07-19 DIAGNOSIS — E785 Hyperlipidemia, unspecified: Secondary | ICD-10-CM | POA: Diagnosis not present

## 2017-07-19 DIAGNOSIS — N289 Disorder of kidney and ureter, unspecified: Secondary | ICD-10-CM | POA: Diagnosis not present

## 2017-07-19 DIAGNOSIS — H01139 Eczematous dermatitis of unspecified eye, unspecified eyelid: Secondary | ICD-10-CM

## 2017-07-19 DIAGNOSIS — F418 Other specified anxiety disorders: Secondary | ICD-10-CM | POA: Diagnosis not present

## 2017-07-19 DIAGNOSIS — F4320 Adjustment disorder, unspecified: Secondary | ICD-10-CM

## 2017-07-19 DIAGNOSIS — J069 Acute upper respiratory infection, unspecified: Secondary | ICD-10-CM | POA: Diagnosis not present

## 2017-07-19 DIAGNOSIS — M8580 Other specified disorders of bone density and structure, unspecified site: Secondary | ICD-10-CM | POA: Diagnosis not present

## 2017-07-19 DIAGNOSIS — F4321 Adjustment disorder with depressed mood: Secondary | ICD-10-CM | POA: Diagnosis not present

## 2017-07-19 DIAGNOSIS — B9789 Other viral agents as the cause of diseases classified elsewhere: Secondary | ICD-10-CM

## 2017-07-19 MED ORDER — AZELASTINE HCL 0.1 % NA SOLN: 2.0000 | Freq: Two times a day (BID) | NASAL | 0 refills | Status: DC

## 2017-07-19 MED ORDER — CITALOPRAM HYDROBROMIDE 40 MG PO TABS: ORAL_TABLET | ORAL | 1 refills | Status: DC

## 2017-07-19 MED ORDER — BENZONATATE 100 MG PO CAPS: 100.0000 mg | ORAL_CAPSULE | Freq: Three times a day (TID) | ORAL | 0 refills | Status: DC | PRN

## 2017-07-19 MED ORDER — TRIAMCINOLONE ACETONIDE 0.1 % EX CREA: TOPICAL_CREAM | Freq: Two times a day (BID) | CUTANEOUS | 1 refills | Status: DC | PRN

## 2017-07-19 MED ORDER — ZOLPIDEM TARTRATE 5 MG PO TABS: ORAL_TABLET | ORAL | 0 refills | Status: DC

## 2017-07-19 NOTE — Assessment & Plan Note (Signed)
Controlled, borderline today likely due to illness. Continue Hyzaar.

## 2017-07-19 NOTE — Assessment & Plan Note (Signed)
Stable overall, complicated presently by the grief over her brother's sudden death. Encouraged her to seek out a church community where she feels supported and cared for.

## 2017-07-19 NOTE — Assessment & Plan Note (Signed)
Tolerating atorvastatin. Update labs.

## 2017-07-19 NOTE — Assessment & Plan Note (Signed)
Resume calcium and vitamin D. Update DEXA.

## 2017-07-19 NOTE — Progress Notes (Signed)
Patient ID: Allison Chavez, female    DOB: 1940/09/01, 76 y.o.   MRN: [id number]  PCP: Harrison Mons, PA-C  Chief Complaint  Patient presents with  . Hypertension  . Follow-up  . Illness    x3 weeks, pt states she has a cough and is fatigued.    Subjective:   Presents for evaluation of HTN, hyperlipidemia, mild renal insufficiency, depression and anxiety.  In addition, she reports cough and fatigue x 3 weeks. "It's the first cold I've had that I can remember. It's been eons." Sore throat. No fever, chills, GI symptoms.  Itchy patches on her legs. Needs a refill of the steroid cream she uses PRN.  Her brother has died. Unexpectedly, just before his son's wedding in October. Acute MI. Age 65. She describes herself as been in a "state of deep mourning." [place] in church, once, tripped when she was being encouraged to go into the [employer] and didn't want to. Broke her glasses, bruised her face. Upset that no one from the church has contacted her to check on her since then. Feels abandoned. Is considering going back to a previous church.  Lost her bottle of zolpidem. Did not sleep at all last night. Requests a refill.  Review of Systems  Constitutional: Positive for fatigue. Negative for chills, diaphoresis and fever.  HENT: Positive for congestion, rhinorrhea and sore throat. Negative for ear discharge, ear pain, facial swelling, nosebleeds, postnasal drip, sinus pressure, sinus pain, sneezing, trouble swallowing and voice change.   Eyes: Negative for visual disturbance.  Respiratory: Positive for cough. Negative for choking, chest tightness, shortness of breath and wheezing.   Cardiovascular: Negative for chest pain, palpitations and leg swelling.  Gastrointestinal: Negative for abdominal pain, constipation, diarrhea, nausea and vomiting.  Skin: Positive for rash.  Neurological: Negative for dizziness and headaches.  Hematological: Negative for adenopathy.  Psychiatric/Behavioral:  Positive for dysphoric mood and sleep disturbance. Negative for confusion, decreased concentration, self-injury and suicidal ideas. The patient is nervous/anxious.        Patient Active Problem List   Diagnosis Date Noted  . Sigmoid diverticulosis 01/15/2017  . Macular degeneration 08/22/2015  . Osteopenia 01/11/2015  . Hyperlipidemia 01/11/2015  . History of dislocation of shoulder 09/22/2014  . Insomnia 10/08/2013  . HTN (hypertension) 03/07/2012  . Depression with anxiety 03/07/2012  . Renal insufficiency, mild      Prior to Admission medications   Medication Sig Start Date End Date Taking? Authorizing Provider  atorvastatin (LIPITOR) 10 MG tablet Take 1 tablet (10 mg total) by mouth daily. 01/28/17  Yes Clotee Schlicker, PA-C  citalopram (CELEXA) 40 MG tablet TAKE 1 & 1/2 TABLETS ONCE DAILY OR AS DIRECTED. 10/25/16  Yes Weber, Sarah L, PA-C  clorazepate (TRANXENE) 7.5 MG tablet TAKE 1 TABLET DAILY AS NEEDED. 06/26/17  Yes Roni Friberg, PA-C  losartan-hydrochlorothiazide (HYZAAR) 50-12.5 MG tablet TAKE 1 TABLET DAILY. 11/20/16  Yes Shaterrica Territo, PA-C  meloxicam (MOBIC) 7.5 MG tablet Take 1 tablet (7.5 mg total) by mouth daily. 01/22/17  Yes Alexys Lobello, PA-C  zolpidem (AMBIEN) 5 MG tablet TAKE 1 TABLET AT BEDTIME IF NEEDED FOR SLEEP. 06/27/17  Yes Rudy Luhmann, PA-C  desonide (DESOWEN) 0.05 % cream Apply topically 2 (two) times daily. Patient not taking: Reported on 09/24/2016 03/25/13   Rikki Spearing P, DO     Allergies  Allergen Reactions  . Ace Inhibitors     Cough   . Sulfa Antibiotics Hives  . Wellbutrin [Bupropion] Rash  Only with generic, immediate release       Objective:  Physical Exam  Constitutional: She is oriented to person, place, and time. She appears well-developed and well-nourished. She is active and cooperative. No distress.  BP 140/80 (BP Location: Left Arm, Patient Position: Sitting, Cuff Size: Normal)   Pulse 81   Temp 99.2 F (37.3 C)  (Oral)   Resp 18   Ht 5\' 2"  (1.575 m)   Wt 169 lb 12.8 oz (77 kg)   SpO2 95%   BMI 31.06 kg/m   HENT:  Head: Normocephalic and atraumatic.  Right Ear: Hearing, tympanic membrane, external ear and ear canal normal.  Left Ear: Hearing, tympanic membrane, external ear and ear canal normal.  Nose: Mucosal edema (mild) present. No rhinorrhea or nasal septal hematoma. No epistaxis.  Mouth/Throat: Uvula is midline, oropharynx is clear and moist and mucous membranes are normal.  Eyes: Conjunctivae are normal. No scleral icterus.  Neck: Normal range of motion and phonation normal. Neck supple. No thyromegaly present.  Cardiovascular: Normal rate, regular rhythm and normal heart sounds.  Pulses:      Radial pulses are 2+ on the right side, and 2+ on the left side.  Pulmonary/Chest: Effort normal and breath sounds normal.  Lymphadenopathy:       Head (right side): No tonsillar, no preauricular, no posterior auricular and no occipital adenopathy present.       Head (left side): No tonsillar, no preauricular, no posterior auricular and no occipital adenopathy present.    She has no cervical adenopathy.       Right: No supraclavicular adenopathy present.       Left: No supraclavicular adenopathy present.  Neurological: She is alert and oriented to person, place, and time. No sensory deficit.  Skin: Skin is warm, dry and intact. No rash noted. No cyanosis or erythema. Nails show no clubbing.  Psychiatric: She has a normal mood and affect. Her speech is normal and behavior is normal.           Assessment & Plan:   Problem List Items Addressed This Visit    Renal insufficiency, mild    Update renal function today.      Relevant Orders   Comprehensive metabolic panel   HTN (hypertension) - Primary    Controlled, borderline today likely due to illness. Continue Hyzaar.      Relevant Orders   CBC with Differential/Platelet   Comprehensive metabolic panel   TSH   Depression with  anxiety    Stable overall, complicated presently by the grief over her brother's sudden death. Encouraged her to seek out a church community where she feels supported and cared for.      Relevant Medications   citalopram (CELEXA) 40 MG tablet   Insomnia   Relevant Medications   zolpidem (AMBIEN) 5 MG tablet   Osteopenia    Resume calcium and vitamin D. Update DEXA.      Relevant Orders   DG Bone Density   Hyperlipidemia    Tolerating atorvastatin. Update labs.      Relevant Orders   Comprehensive metabolic panel   Lipid panel    Other Visit Diagnoses    Grief reaction       Need for influenza vaccination       Viral URI with cough       Relevant Medications   azelastine (ASTELIN) 0.1 % nasal spray   benzonatate (TESSALON) 100 MG capsule   Eczematous dermatitis of eyelid, unspecified laterality  Relevant Medications   triamcinolone cream (KENALOG) 0.1 %       Return in about 4 months (around 11/17/2017) for re-evalaution of blood pressure, cholesterol, mood.   Fara Chute, PA-C [facility]

## 2017-07-19 NOTE — Assessment & Plan Note (Signed)
Update renal function today. 

## 2017-07-19 NOTE — Telephone Encounter (Signed)
Could we get this patient to come back in and discuss her memory?

## 2017-07-19 NOTE — Telephone Encounter (Signed)
Unfortunately, I did not see this message before I saw this patient.  Please call the patient. Ask her to return to the office (today, if possible), so that I can do a little more investigating her memory. She related to me that she got lost going to [place] this summer, that her son had to come get her, and I would like to explore that a little more.  We need to do a MMSE.

## 2017-07-19 NOTE — Patient Instructions (Addendum)
I encourage you to pick a new church community that can help to support you.  Please take calcium 1500 mg daily and Vitamin D 2000 IU daily.    IF you received an x-ray today, you will receive an invoice from [facility]. Please contact [facility] at [phone] with questions or concerns regarding your invoice.   IF you received labwork today, you will receive an invoice from [facility]. Please contact [facility] at [phone] with questions or concerns regarding your invoice.   Our billing staff will not be able to assist you with questions regarding bills from these companies.  You will be contacted with the lab results as soon as they are available. The fastest way to get your results is to activate your My Chart account. Instructions are located on the last page of this paperwork. If you have not heard from [place] regarding the results in 2 weeks, please contact this office.

## 2017-07-20 ENCOUNTER — Ambulatory Visit: Payer: Medicare Other | Admitting: Physician Assistant

## 2017-07-20 LAB — CBC WITH DIFFERENTIAL/PLATELET
BASOS ABS: 0.1 10*3/uL (ref 0.0–0.2)
Basos: 1 %
EOS (ABSOLUTE): 0.3 10*3/uL (ref 0.0–0.4)
Eos: 5 %
HEMOGLOBIN: 10.7 g/dL — AB (ref 11.1–15.9)
Hematocrit: 31.1 % — ABNORMAL LOW (ref 34.0–46.6)
IMMATURE GRANS (ABS): 0.1 10*3/uL (ref 0.0–0.1)
IMMATURE GRANULOCYTES: 1 %
LYMPHS: 32 %
Lymphocytes Absolute: 2 10*3/uL (ref 0.7–3.1)
MCH: 33.2 pg — ABNORMAL HIGH (ref 26.6–33.0)
MCHC: 34.4 g/dL (ref 31.5–35.7)
MCV: 97 fL (ref 79–97)
MONOCYTES: 8 %
Monocytes Absolute: 0.5 10*3/uL (ref 0.1–0.9)
NEUTROS PCT: 53 %
Neutrophils Absolute: 3.2 10*3/uL (ref 1.4–7.0)
Platelets: 256 10*3/uL (ref 150–379)
RBC: 3.22 x10E6/uL — AB (ref 3.77–5.28)
RDW: 14.5 % (ref 12.3–15.4)
WBC: 6 10*3/uL (ref 3.4–10.8)

## 2017-07-20 LAB — COMPREHENSIVE METABOLIC PANEL
ALBUMIN: 3.8 g/dL (ref 3.5–4.8)
ALK PHOS: 84 IU/L (ref 39–117)
ALT: 11 IU/L (ref 0–32)
AST: 16 IU/L (ref 0–40)
Albumin/Globulin Ratio: 1.6 (ref 1.2–2.2)
BUN / CREAT RATIO: 11 — AB (ref 12–28)
BUN: 16 mg/dL (ref 8–27)
Bilirubin Total: 0.4 mg/dL (ref 0.0–1.2)
CO2: 21 mmol/L (ref 20–29)
CREATININE: 1.4 mg/dL — AB (ref 0.57–1.00)
Calcium: 8.3 mg/dL — ABNORMAL LOW (ref 8.7–10.3)
Chloride: 105 mmol/L (ref 96–106)
GFR calc non Af Amer: 37 mL/min/{1.73_m2} — ABNORMAL LOW (ref >59)
GFR, EST AFRICAN AMERICAN: 43 mL/min/{1.73_m2} — AB (ref >59)
GLUCOSE: 84 mg/dL (ref 65–99)
Globulin, Total: 2.4 g/dL (ref 1.5–4.5)
Potassium: 4.1 mmol/L (ref 3.5–5.2)
Sodium: 138 mmol/L (ref 134–144)
TOTAL PROTEIN: 6.2 g/dL (ref 6.0–8.5)

## 2017-07-20 LAB — LIPID PANEL
CHOLESTEROL TOTAL: 180 mg/dL (ref 100–199)
Chol/HDL Ratio: 2.6 ratio (ref 0.0–4.4)
HDL: 70 mg/dL (ref >39)
LDL CALC: 91 mg/dL (ref 0–99)
Triglycerides: 94 mg/dL (ref 0–149)
VLDL CHOLESTEROL CAL: 19 mg/dL (ref 5–40)

## 2017-07-20 LAB — TSH: TSH: 7.38 u[IU]/mL — ABNORMAL HIGH (ref 0.450–4.500)

## 2017-07-25 NOTE — Telephone Encounter (Signed)
Called pt, had to leave message. I stated that I had a note from [facility] to call pt and make an appt for them to "continue their conversation" from the last appt that she had. Chelle would like for her to come back in and talk about various things from last appt.  When pt. Calls back, please try and schedule them as soon as they can get back in to see Chelle.  Thanks!

## 2017-07-25 NOTE — Telephone Encounter (Signed)
error 

## 2017-08-13 ENCOUNTER — Other Ambulatory Visit: Payer: Self-pay | Admitting: Physician Assistant

## 2017-08-13 DIAGNOSIS — F418 Other specified anxiety disorders: Secondary | ICD-10-CM

## 2017-08-13 NOTE — Telephone Encounter (Signed)
Clorazepate refill.

## 2017-08-13 NOTE — Telephone Encounter (Signed)
Please advise 

## 2017-08-13 NOTE — Telephone Encounter (Signed)
Rx sent electronically.  Meds ordered this encounter  Medications  . clorazepate (TRANXENE) 7.5 MG tablet    Sig: TAKE 1 TABLET DAILY AS NEEDED.    Dispense:  30 tablet    Refill:  0

## 2017-08-13 NOTE — Telephone Encounter (Signed)
Requesting refill of Tranxene.

## 2017-08-14 ENCOUNTER — Other Ambulatory Visit: Payer: Self-pay | Admitting: Physician Assistant

## 2017-08-14 DIAGNOSIS — G4700 Insomnia, unspecified: Secondary | ICD-10-CM

## 2017-08-14 NOTE — Telephone Encounter (Signed)
Requesting ambien.

## 2017-08-16 NOTE — Telephone Encounter (Signed)
Pt checking on status of this message. Please advise at [phone] Pharmacy:  [facility], [place]

## 2017-08-17 NOTE — Telephone Encounter (Signed)
Rx sent electronically.  Meds ordered this encounter  Medications  . zolpidem (AMBIEN) 5 MG tablet    Sig: TAKE 1 TABLET BY MOUTH AT BEDTIME IF NEEDED FOR SLEEP.    Dispense:  30 tablet    Refill:  0

## 2017-09-04 DIAGNOSIS — L309 Dermatitis, unspecified: Secondary | ICD-10-CM | POA: Diagnosis not present

## 2017-09-04 DIAGNOSIS — L814 Other melanin hyperpigmentation: Secondary | ICD-10-CM | POA: Diagnosis not present

## 2017-09-04 DIAGNOSIS — L308 Other specified dermatitis: Secondary | ICD-10-CM | POA: Diagnosis not present

## 2017-09-04 DIAGNOSIS — D1801 Hemangioma of skin and subcutaneous tissue: Secondary | ICD-10-CM | POA: Diagnosis not present

## 2017-09-04 DIAGNOSIS — L821 Other seborrheic keratosis: Secondary | ICD-10-CM | POA: Diagnosis not present

## 2017-09-10 ENCOUNTER — Ambulatory Visit: Payer: Medicare Other | Admitting: Physician Assistant

## 2017-09-13 ENCOUNTER — Other Ambulatory Visit: Payer: Self-pay | Admitting: Physician Assistant

## 2017-09-13 DIAGNOSIS — G4700 Insomnia, unspecified: Secondary | ICD-10-CM

## 2017-09-13 DIAGNOSIS — F418 Other specified anxiety disorders: Secondary | ICD-10-CM

## 2017-09-13 NOTE — Telephone Encounter (Signed)
Requesting refill of Ambien and Tranxene  LOV 07/19/17  LRF ambien  08/17/17   #30  0 refills  LRF Tranxene  08/13/17  #30  0 refills  [facility], [place]

## 2017-09-14 NOTE — Telephone Encounter (Signed)
Refill req for Tranxene and Ambien sent to [facility]

## 2017-09-16 NOTE — Telephone Encounter (Signed)
Rx sent electronically.  Meds ordered this encounter  Medications  . clorazepate (TRANXENE) 7.5 MG tablet    Sig: TAKE 1 TABLET DAILY AS NEEDED.    Dispense:  30 tablet    Refill:  0  . zolpidem (AMBIEN) 5 MG tablet    Sig: TAKE 1 TABLET BY MOUTH AT BEDTIME IF NEEDED FOR SLEEP.    Dispense:  30 tablet    Refill:  0

## 2017-09-26 ENCOUNTER — Encounter: Payer: Self-pay | Admitting: Physician Assistant

## 2017-09-26 ENCOUNTER — Ambulatory Visit: Payer: Medicare Other | Admitting: Physician Assistant

## 2017-09-26 ENCOUNTER — Other Ambulatory Visit: Payer: Self-pay

## 2017-09-26 ENCOUNTER — Telehealth: Payer: Self-pay | Admitting: Physician Assistant

## 2017-09-26 VITALS — BP 110/60 | HR 95 | Temp 98.5°F | Resp 16 | Ht 62.0 in | Wt 155.8 lb

## 2017-09-26 DIAGNOSIS — N289 Disorder of kidney and ureter, unspecified: Secondary | ICD-10-CM | POA: Diagnosis not present

## 2017-09-26 DIAGNOSIS — R7989 Other specified abnormal findings of blood chemistry: Secondary | ICD-10-CM

## 2017-09-26 DIAGNOSIS — Z23 Encounter for immunization: Secondary | ICD-10-CM | POA: Diagnosis not present

## 2017-09-26 DIAGNOSIS — I10 Essential (primary) hypertension: Secondary | ICD-10-CM

## 2017-09-26 MED ORDER — LOSARTAN POTASSIUM-HCTZ 50-12.5 MG PO TABS: 1.0000 | ORAL_TABLET | Freq: Every day | ORAL | 3 refills | Status: DC

## 2017-09-26 NOTE — Telephone Encounter (Signed)
See notes below. I forgot to route to clinical pool. Sorry!

## 2017-09-26 NOTE — Assessment & Plan Note (Signed)
Normal renal function at last check. Repeat today to ensure stability.

## 2017-09-26 NOTE — Patient Instructions (Signed)
     IF you received an x-ray today, you will receive an invoice from [facility]. Please contact [facility] at [phone] with questions or concerns regarding your invoice.   IF you received labwork today, you will receive an invoice from [facility]. Please contact [facility] at [phone] with questions or concerns regarding your invoice.   Our billing staff will not be able to assist you with questions regarding bills from these companies.  You will be contacted with the lab results as soon as they are available. The fastest way to get your results is to activate your My Chart account. Instructions are located on the last page of this paperwork. If you have not heard from us regarding the results in 2 weeks, please contact this office.     
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
## 2017-09-26 NOTE — Assessment & Plan Note (Signed)
Very well controlled. No changes in treatment today. Consider eliminating HCTZ component if remains this well controlled, or if she develops dizziness.

## 2017-09-26 NOTE — Assessment & Plan Note (Signed)
Asymptomatic. Update TSH and T4 today.

## 2017-09-26 NOTE — Telephone Encounter (Signed)
Pt dropped off DMV PHY forms to be filled out by [facility]. I have given the pt a copy (blank) and put the forms in Chelle's box. I took a $150 payment from pt and posted to account.
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
## 2017-09-27 DIAGNOSIS — L820 Inflamed seborrheic keratosis: Secondary | ICD-10-CM | POA: Diagnosis not present

## 2017-09-27 DIAGNOSIS — L239 Allergic contact dermatitis, unspecified cause: Secondary | ICD-10-CM | POA: Diagnosis not present

## 2017-09-27 DIAGNOSIS — L308 Other specified dermatitis: Secondary | ICD-10-CM | POA: Diagnosis not present

## 2017-09-27 LAB — CBC WITH DIFFERENTIAL/PLATELET
BASOS: 0 %
Basophils Absolute: 0 10*3/uL (ref 0.0–0.2)
EOS (ABSOLUTE): 0.1 10*3/uL (ref 0.0–0.4)
EOS: 2 %
HEMATOCRIT: 35.1 % (ref 34.0–46.6)
Hemoglobin: 11.7 g/dL (ref 11.1–15.9)
IMMATURE GRANULOCYTES: 1 %
Immature Grans (Abs): 0 10*3/uL (ref 0.0–0.1)
Lymphocytes Absolute: 1.9 10*3/uL (ref 0.7–3.1)
Lymphs: 30 %
MCH: 31.9 pg (ref 26.6–33.0)
MCHC: 33.3 g/dL (ref 31.5–35.7)
MCV: 96 fL (ref 79–97)
MONOS ABS: 0.4 10*3/uL (ref 0.1–0.9)
Monocytes: 7 %
NEUTROS ABS: 4 10*3/uL (ref 1.4–7.0)
Neutrophils: 60 %
PLATELETS: 265 10*3/uL (ref 150–379)
RBC: 3.67 x10E6/uL — ABNORMAL LOW (ref 3.77–5.28)
RDW: 14.2 % (ref 12.3–15.4)
WBC: 6.5 10*3/uL (ref 3.4–10.8)

## 2017-09-27 LAB — COMPREHENSIVE METABOLIC PANEL
A/G RATIO: 1.9 (ref 1.2–2.2)
ALT: 11 IU/L (ref 0–32)
AST: 15 IU/L (ref 0–40)
Albumin: 4.7 g/dL (ref 3.5–4.8)
Alkaline Phosphatase: 69 IU/L (ref 39–117)
BUN/Creatinine Ratio: 10 — ABNORMAL LOW (ref 12–28)
BUN: 14 mg/dL (ref 8–27)
Bilirubin Total: 0.4 mg/dL (ref 0.0–1.2)
CALCIUM: 9.8 mg/dL (ref 8.7–10.3)
CHLORIDE: 101 mmol/L (ref 96–106)
CO2: 22 mmol/L (ref 20–29)
Creatinine, Ser: 1.46 mg/dL — ABNORMAL HIGH (ref 0.57–1.00)
GFR, EST AFRICAN AMERICAN: 41 mL/min/{1.73_m2} — AB (ref >59)
GFR, EST NON AFRICAN AMERICAN: 35 mL/min/{1.73_m2} — AB (ref >59)
GLOBULIN, TOTAL: 2.5 g/dL (ref 1.5–4.5)
Glucose: 97 mg/dL (ref 65–99)
POTASSIUM: 5.4 mmol/L — AB (ref 3.5–5.2)
SODIUM: 141 mmol/L (ref 134–144)
Total Protein: 7.2 g/dL (ref 6.0–8.5)

## 2017-09-27 LAB — TSH: TSH: 3.2 u[IU]/mL (ref 0.450–4.500)

## 2017-09-27 LAB — URINALYSIS, DIPSTICK ONLY
Bilirubin, UA: NEGATIVE
Glucose, UA: NEGATIVE
Ketones, UA: NEGATIVE
NITRITE UA: NEGATIVE
PH UA: 7.5 (ref 5.0–7.5)
PROTEIN UA: NEGATIVE
RBC, UA: NEGATIVE
Specific Gravity, UA: 1.016 (ref 1.005–1.030)
Urobilinogen, Ur: 0.2 mg/dL (ref 0.2–1.0)

## 2017-09-27 LAB — T4, FREE: FREE T4: 1.11 ng/dL (ref 0.82–1.77)

## 2017-09-29 ENCOUNTER — Encounter: Payer: Self-pay | Admitting: Physician Assistant

## 2017-10-01 ENCOUNTER — Encounter: Payer: Self-pay | Admitting: Physician Assistant

## 2017-10-12 ENCOUNTER — Other Ambulatory Visit: Payer: Self-pay

## 2017-10-12 ENCOUNTER — Emergency Department (HOSPITAL_COMMUNITY)
Admission: EM | Admit: 2017-10-12 | Discharge: 2017-10-12 | Disposition: A | Discharge status: Home / Self Care | Payer: Medicare Other | Attending: Emergency Medicine | Admitting: Emergency Medicine

## 2017-10-12 ENCOUNTER — Emergency Department (HOSPITAL_COMMUNITY): Payer: Medicare Other

## 2017-10-12 ENCOUNTER — Encounter (HOSPITAL_COMMUNITY): Payer: Self-pay

## 2017-10-12 DIAGNOSIS — Z79899 Other long term (current) drug therapy: Secondary | ICD-10-CM | POA: Insufficient documentation

## 2017-10-12 DIAGNOSIS — Y9301 Activity, walking, marching and hiking: Secondary | ICD-10-CM | POA: Diagnosis not present

## 2017-10-12 DIAGNOSIS — S0181XA Laceration without foreign body of other part of head, initial encounter: Secondary | ICD-10-CM | POA: Insufficient documentation

## 2017-10-12 DIAGNOSIS — S0031XA Abrasion of nose, initial encounter: Secondary | ICD-10-CM | POA: Diagnosis not present

## 2017-10-12 DIAGNOSIS — Y92410 Unspecified street and highway as the place of occurrence of the external cause: Secondary | ICD-10-CM | POA: Diagnosis not present

## 2017-10-12 DIAGNOSIS — W010XXA Fall on same level from slipping, tripping and stumbling without subsequent striking against object, initial encounter: Secondary | ICD-10-CM | POA: Insufficient documentation

## 2017-10-12 DIAGNOSIS — Y999 Unspecified external cause status: Secondary | ICD-10-CM | POA: Diagnosis not present

## 2017-10-12 DIAGNOSIS — Z87891 Personal history of nicotine dependence: Secondary | ICD-10-CM | POA: Insufficient documentation

## 2017-10-12 DIAGNOSIS — S098XXA Other specified injuries of head, initial encounter: Secondary | ICD-10-CM | POA: Diagnosis not present

## 2017-10-12 DIAGNOSIS — I10 Essential (primary) hypertension: Secondary | ICD-10-CM | POA: Diagnosis not present

## 2017-10-12 DIAGNOSIS — S0990XA Unspecified injury of head, initial encounter: Secondary | ICD-10-CM | POA: Diagnosis not present

## 2017-10-12 DIAGNOSIS — S199XXA Unspecified injury of neck, initial encounter: Secondary | ICD-10-CM | POA: Diagnosis not present

## 2017-10-12 DIAGNOSIS — S0993XA Unspecified injury of face, initial encounter: Secondary | ICD-10-CM | POA: Diagnosis not present

## 2017-10-12 MED ORDER — LIDOCAINE-EPINEPHRINE (PF) 2 %-1:200000 IJ SOLN
10.0000 mL | Freq: Once | INTRAMUSCULAR | Status: AC
  Administered 2017-10-12: 10 mL via INTRADERMAL
  Filled 2017-10-12: qty 20

## 2017-10-12 MED ORDER — LIDOCAINE-EPINEPHRINE-TETRACAINE (LET) SOLUTION
3.0000 mL | Freq: Once | NASAL | Status: AC
  Administered 2017-10-12: 3 mL via TOPICAL
  Filled 2017-10-12: qty 3

## 2017-10-12 NOTE — Discharge Instructions (Signed)
Keep your abrasions clean and dry. Bacitracin twice a day. Suture removal in 5-7 days. Follow up with family doctor as needed.

## 2017-10-12 NOTE — ED Triage Notes (Signed)
She tells me she tripped and fell while walking on a street; falling forward. She has abrasions at bridge of nose and chin areas. She has had a minimal right-sided epistaxis, which has slowed to a trickle. She denies l.o.c. And denies any other pain or discomfort other than her face.
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
## 2017-10-15 ENCOUNTER — Other Ambulatory Visit: Payer: Self-pay | Admitting: Physician Assistant

## 2017-10-15 DIAGNOSIS — G4700 Insomnia, unspecified: Secondary | ICD-10-CM

## 2017-10-15 NOTE — Telephone Encounter (Signed)
Please advise. Dgaddy, CMA 

## 2017-10-15 NOTE — Telephone Encounter (Signed)
Ambien refill request - controlled substance  LOV 09/26/17 with [place], [place]

## 2017-10-16 ENCOUNTER — Other Ambulatory Visit: Payer: Self-pay | Admitting: Physician Assistant

## 2017-10-16 DIAGNOSIS — F418 Other specified anxiety disorders: Secondary | ICD-10-CM

## 2017-10-17 ENCOUNTER — Other Ambulatory Visit: Payer: Self-pay

## 2017-10-17 ENCOUNTER — Encounter: Payer: Self-pay | Admitting: Physician Assistant

## 2017-10-17 ENCOUNTER — Ambulatory Visit (INDEPENDENT_AMBULATORY_CARE_PROVIDER_SITE_OTHER): Payer: Medicare Other | Admitting: Physician Assistant

## 2017-10-17 VITALS — BP 110/70 | HR 96 | Temp 98.4°F | Resp 16 | Ht 62.0 in | Wt 154.4 lb

## 2017-10-17 DIAGNOSIS — F418 Other specified anxiety disorders: Secondary | ICD-10-CM | POA: Diagnosis not present

## 2017-10-17 DIAGNOSIS — W19XXXA Unspecified fall, initial encounter: Secondary | ICD-10-CM | POA: Diagnosis not present

## 2017-10-17 DIAGNOSIS — G4700 Insomnia, unspecified: Secondary | ICD-10-CM

## 2017-10-17 DIAGNOSIS — S0180XA Unspecified open wound of other part of head, initial encounter: Secondary | ICD-10-CM | POA: Diagnosis not present

## 2017-10-17 NOTE — Telephone Encounter (Signed)
Pt coming in today at 5 will address it then

## 2017-10-17 NOTE — Telephone Encounter (Signed)
clorazepate refill Last OV: 09/24/16 Last Refill:09/16/17 Pharmacy:[facility] PCP: Harrison Mons PA
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
## 2017-10-17 NOTE — Patient Instructions (Addendum)
Other psychiatrists I like:  Dr. Albertine Patricia, Dr. Reece Levy [phone] [place] [phone] [place] [phone] Dr. Clovis Pu [phone] Dr. Toy Cookey [phone]    IF you received an x-ray today, you will receive an invoice from [facility]. Please contact [facility] at [phone] with questions or concerns regarding your invoice.   IF you received labwork today, you will receive an invoice from [facility]. Please contact [facility] at [phone] with questions or concerns regarding your invoice.   Our billing staff will not be able to assist you with questions regarding bills from these companies.  You will be contacted with the lab results as soon as they are available. The fastest way to get your results is to activate your My Chart account. Instructions are located on the last page of this paperwork. If you have not heard from [place] regarding the results in 2 weeks, please contact this office.

## 2017-10-17 NOTE — Telephone Encounter (Signed)
Pt coming In at 5 pm today and will address refills then

## 2017-10-24 ENCOUNTER — Telehealth: Payer: Self-pay | Admitting: Physician Assistant

## 2017-10-24 NOTE — Telephone Encounter (Signed)
Pt. Has come into the office with a DMV form and a receipt for a full DMV form/physical payment. She produced a set of blank forms and made the assertion that they had not been completed. We have found record of the pt. Being given black forms. We are wondering if Chelle has the original forms.  We did not find record of a DMV physical or evaluation being preformed and are looking for clarification on the issue as a whole. If we are in possession of the forms the patient has requested they be made available to her and she is called to be given a time to pick them up. If we need to get her in for an evaluation for the forms I would be happy to make her an appointment.   Please Advise   Best Number: [phone]

## 2017-10-25 NOTE — Telephone Encounter (Signed)
Do you have any info on this?

## 2017-10-26 NOTE — Telephone Encounter (Signed)
I do recall the question of whether or not to complete the forms, due to the cost. I do not specifically recall whether the forms were completed. If they were, they should have been copied and scanned.  If she has paid the fee, I am happy to complete/re-complete the forms.

## 2017-10-29 ENCOUNTER — Encounter: Payer: Self-pay | Admitting: Physician Assistant

## 2017-10-30 ENCOUNTER — Telehealth: Payer: Self-pay | Admitting: Physician Assistant

## 2017-10-30 DIAGNOSIS — R296 Repeated falls: Secondary | ICD-10-CM

## 2017-10-30 NOTE — Telephone Encounter (Signed)
Phone call to patient's daughter, Chrys Racer. Unable to reach.   If Chrys Racer calls back, what would she like to discuss with Chelle?

## 2017-10-30 NOTE — Telephone Encounter (Signed)
Copied from [place] [id number]. Topic: Quick Communication - See Telephone Encounter >> Oct 30, 2017 10:35 AM Neva Seat wrote: Daughter - Clemencia Course [phone]  Needing to discuss pt's care w/ Daphane Shepherd. Please call daughter back asap.

## 2017-10-30 NOTE — Telephone Encounter (Signed)
Referral placed the the [facility] for comprehensive evaluation. Please advise daughter. Also advise her that I, too, have concerns, which I addressed with the patient at her last visit. The patient does not think that she has any deficits.

## 2017-10-30 NOTE — Telephone Encounter (Signed)
Patient's daughter called back. Had a missed Call. She [place] and has gotten messages from friends and church members that they are worried about her mother's stability and cognitive awareness. Daughter wasn't sure if Allison Chavez could refer her to a geriatric specialist for base line tests for these issues. Requesting a call back.

## 2017-10-30 NOTE — Addendum Note (Signed)
Addended by: Fara Chute on: 10/30/2017 06:53 PM   Modules accepted: Orders

## 2017-10-31 NOTE — Telephone Encounter (Signed)
Left detailed message for the daughter.

## 2017-11-04 ENCOUNTER — Encounter: Payer: Self-pay | Admitting: Physician Assistant

## 2017-11-15 ENCOUNTER — Telehealth: Payer: Self-pay

## 2017-11-15 NOTE — Telephone Encounter (Signed)
Please let patient know that I am thankful for her well wishes, and hope that she had a great birthday earlier this week!  I have updated her record so that Dr. Darron Doom is listed as her PCP. Dr. Darron Doom is an excellent physician, and I believe that she will be happy and well cared for there.

## 2017-11-15 NOTE — Telephone Encounter (Signed)
Pt advised.

## 2017-11-15 NOTE — Telephone Encounter (Signed)
Copied from [place] [phone]. Topic: General - Other >> Nov 15, 2017 11:04 AM Rutherford Nail, NT wrote: Reason for CRM: Patient calling to speak to [facility]. States she wanted to wish her well in the next steps of her career. Wanted to know "what's next for Chelle?" and she wanted to inform her that she was changing her PCP to [employer]. CB#: [phone]

## 2017-11-16 ENCOUNTER — Telehealth: Payer: Self-pay | Admitting: Physician Assistant

## 2017-11-16 NOTE — Telephone Encounter (Signed)
Pt. Called requesting a refill on her zolpidem

## 2017-11-18 ENCOUNTER — Other Ambulatory Visit: Payer: Self-pay | Admitting: Physician Assistant

## 2017-11-18 DIAGNOSIS — G4700 Insomnia, unspecified: Secondary | ICD-10-CM

## 2017-11-18 NOTE — Telephone Encounter (Signed)
Refill  Request  Ambien  LOV 10/17/2017  Pharmacy  On File

## 2017-11-18 NOTE — Telephone Encounter (Signed)
Request for zolpidem 5 mg tab. Last refill was 10/16/17 for  #30 tabs.  LOV  10/17/17 NOV  11/19/17 (tomorrow)  Provider  Harrison Mons, [place]  Please review.

## 2017-11-18 NOTE — Telephone Encounter (Signed)
Copied from [place] [phone]. Topic: General - Other >> Nov 18, 2017  9:40 AM Darl Householder, RMA wrote: Reason for CRM: Medication refill request for zolpidem (AMBIEN) 5 MG tablet  to be sent to [facility]

## 2017-11-18 NOTE — Telephone Encounter (Signed)
Patient has appointment tomorrow

## 2017-11-18 NOTE — Telephone Encounter (Signed)
Pt has f/u with Chelle on 4/23.

## 2017-11-18 NOTE — Telephone Encounter (Signed)
Patient is requesting a refill of the following medications: Requested Prescriptions   Pending Prescriptions Disp Refills  . zolpidem (AMBIEN) 5 MG tablet [Pharmacy Med Name: ZOLPIDEM TARTRATE 5 MG TABLET] 30 tablet 0    Sig: TAKE 1 TABLET BY MOUTH AT BEDTIME IF NEEDED FOR SLEEP.    Date of patient request: 11/18/17  Last office visit: 10/17/17  Date of last refill: 10/16/17 Last refill amount: #30 0RF Follow up time period per chart: 03/26/18

## 2017-11-19 ENCOUNTER — Ambulatory Visit: Payer: Medicare Other | Admitting: Physician Assistant

## 2017-11-19 NOTE — Telephone Encounter (Signed)
Patient called and advised of the appointment tomorrow and to ask for a refill of Ambien at that time, patient verbalized understanding.

## 2017-11-19 NOTE — Telephone Encounter (Signed)
Pt called to check the status of medication, call pt if needed to update on the status

## 2017-11-19 NOTE — Telephone Encounter (Signed)
Patient has rescheduled today's visit with me for tomorrow. Plan to address this refill request at that time.  Of note, that patient has communicated to me that she is transferring to Dr. Horald Pollen for primary care. Plan to clarify when that will occur at tomorrow's visit so that patient has medications from me until her first visit with Dr. Darron Doom.

## 2017-11-20 ENCOUNTER — Encounter: Payer: Self-pay | Admitting: Physician Assistant

## 2017-11-20 ENCOUNTER — Other Ambulatory Visit: Payer: Self-pay

## 2017-11-20 ENCOUNTER — Ambulatory Visit (INDEPENDENT_AMBULATORY_CARE_PROVIDER_SITE_OTHER): Payer: Medicare Other | Admitting: Physician Assistant

## 2017-11-20 DIAGNOSIS — G4700 Insomnia, unspecified: Secondary | ICD-10-CM

## 2017-11-20 DIAGNOSIS — F418 Other specified anxiety disorders: Secondary | ICD-10-CM | POA: Diagnosis not present

## 2017-11-20 MED ORDER — ZOLPIDEM TARTRATE 5 MG PO TABS: 5.0000 mg | ORAL_TABLET | Freq: Every evening | ORAL | 0 refills | Status: DC | PRN

## 2017-11-20 NOTE — Progress Notes (Signed)
Subjective:    Patient ID: Allison Chavez, female    DOB: 1941-01-07, 77 y.o.   MRN: [id number] Chief Complaint  Patient presents with  . Anxiety    GAD 7 Score-0  . Insomnia    Pt states and hasn't slept well the last couple of nights.  . Follow-up    HPI  Anxiety - Reports feeling a lot better, despite not taking her citalopram. Unsure the amount of times she is taking it, about 2 times/week when she feels stressed. Does not believe she needs it anymore, her anxiety about money has been resolved. She did not take today.  Insomnia - She reports chronic history of insomnia. She has not had her Ambien for a week and has had trouble sleeping for the past week.    Review of Systems  Constitutional: Positive for fatigue. Negative for activity change, chills, diaphoresis, fever and unexpected weight change.  HENT: Negative.   Eyes: Negative.   Respiratory: Negative.   Cardiovascular: Negative.   Gastrointestinal: Negative.   Endocrine: Negative.   Musculoskeletal: Negative.   Skin: Negative.   Allergic/Immunologic: Negative.   Neurological: Negative.   Psychiatric/Behavioral: Negative.     Patient Active Problem List   Diagnosis Date Noted  . Elevated TSH 09/26/2017  . Sigmoid diverticulosis 01/15/2017  . Macular degeneration 08/22/2015  . Osteopenia 01/11/2015  . Hyperlipidemia 01/11/2015  . History of dislocation of shoulder 09/22/2014  . Insomnia 10/08/2013  . HTN (hypertension) 03/07/2012  . Depression with anxiety 03/07/2012  . Renal insufficiency, mild     Past Medical History:  Diagnosis Date  . Anxiety   . Cataract   . Depression   . DJD (degenerative joint disease) of cervical spine   . DJD of shoulder, right   . Hypertension   . Pneumonia    as a child  . Renal insufficiency, mild     Prior to Admission medications   Medication Sig Start Date End Date Taking? Authorizing Provider  atorvastatin (LIPITOR) 10 MG tablet Take 1 tablet (10 mg total) by  mouth daily. 01/28/17  Yes Jeffery, Chelle, PA-C  losartan-hydrochlorothiazide (HYZAAR) 50-12.5 MG tablet Take 1 tablet by mouth daily. 09/26/17  Yes Jeffery, Chelle, PA-C  zolpidem (AMBIEN) 5 MG tablet TAKE 1 TABLET BY MOUTH AT BEDTIME IF NEEDED FOR SLEEP. 10/16/17  Yes Jeffery, Chelle, PA-C    Allergies  Allergen Reactions  . Ace Inhibitors Cough    Cough   . Sulfa Antibiotics Hives  . Wellbutrin [Bupropion] Rash    Only with generic, immediate release       Objective:   Physical Exam  Constitutional: She is oriented to person, place, and time. She appears well-developed and well-nourished. No distress.  HENT:  Head: Normocephalic and atraumatic.  Right Ear: External ear normal.  Left Ear: External ear normal.  Eyes: Pupils are equal, round, and reactive to light. Conjunctivae and EOM are normal. Right eye exhibits no discharge. Left eye exhibits no discharge.  Neck: Normal range of motion. Neck supple. No thyromegaly present.  Cardiovascular: Normal rate, regular rhythm and intact distal pulses. Exam reveals no gallop and no friction rub.  No murmur heard. Pulmonary/Chest: Effort normal and breath sounds normal.  Musculoskeletal: Normal range of motion.  Lymphadenopathy:    She has no cervical adenopathy.  Neurological: She is alert and oriented to person, place, and time.  Skin: Skin is warm and dry. She is not diaphoretic.  Psychiatric: She has a normal mood and affect. Her  behavior is normal.      GAD 7 : Generalized Anxiety Score 11/20/2017  Nervous, Anxious, on Edge 0  Control/stop worrying 0  Worry too much - different things 0  Trouble relaxing 0  Restless 0  Easily annoyed or irritable 0  Afraid - awful might happen 0  Total GAD 7 Score 0  Anxiety Difficulty Not difficult at all       Assessment & Plan:  1. Insomnia, unspecified type Well controlled on zolpidem 5mg . - zolpidem (AMBIEN) 5 MG tablet; Take 1 tablet (5 mg total) by mouth at bedtime as needed  for sleep.  Dispense: 30 tablet; Refill: 0  Pt is transferring care to Dr Horald Pollen, her 1st appointment will be within next 4 weeks. Will continue to prescribe until established with new provider.
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
## 2017-11-20 NOTE — Assessment & Plan Note (Signed)
She has stopped citalopram, reporting that she doesn't think she needs it anymore. Would recommend restart if her mood declines or anxiety increases, and continue to avoid benzodiazepines.

## 2017-11-20 NOTE — Patient Instructions (Addendum)
     IF you received an x-ray today, you will receive an invoice from [facility]. Please contact [facility] at [phone] with questions or concerns regarding your invoice.   IF you received labwork today, you will receive an invoice from [facility]. Please contact [facility] at [phone] with questions or concerns regarding your invoice.   Our billing staff will not be able to assist you with questions regarding bills from these companies.  You will be contacted with the lab results as soon as they are available. The fastest way to get your results is to activate your My Chart account. Instructions are located on the last page of this paperwork. If you have not heard from us regarding the results in 2 weeks, please contact this office.     

## 2017-11-20 NOTE — Assessment & Plan Note (Signed)
Stable on zolpidem. Doesn't sleep at all when she doesn't take it. Would consider resuming citalopram or change to mirtazapine, rather than increase this dose.

## 2017-12-16 ENCOUNTER — Other Ambulatory Visit: Payer: Self-pay | Admitting: Physician Assistant

## 2017-12-16 DIAGNOSIS — G4700 Insomnia, unspecified: Secondary | ICD-10-CM

## 2017-12-16 NOTE — Telephone Encounter (Signed)
Copied from [place] [phone]. Topic: Quick Communication - Rx Refill/Question >> Dec 16, 2017  1:41 PM Aurelio Brash B wrote: Medication:zolpidem (AMBIEN) 5 MG tablet Has the patient contacted their pharmacy? no (Agent: If no, request that the patient contact the pharmacy for the refill.)  (Agent: If yes, when and what did the pharmacy advise?)  Preferred Pharmacy (with phone number or street name): [facility], [place]. [phone] (Phone) [phone] (Fax)      Agent: Please be advised that RX refills may take up to 3 business days. We ask that you follow-up with your pharmacy.

## 2017-12-16 NOTE — Telephone Encounter (Signed)
PT called to check on status of the forms

## 2017-12-17 NOTE — Telephone Encounter (Signed)
Spoke with front desk, confirmed patient paid fee for forms in February 2019.  Provider, please complete DMV forms.

## 2017-12-17 NOTE — Telephone Encounter (Signed)
My portion of form completed. Patient needs to complete the demographics portion of the information form and page 1. Her eye specialist needs to complete page 3.  Once copied, we can send all pages except page 3. Her eye specialist can send page 3 directly.  Forms returned to FMLA/disability desk.

## 2017-12-17 NOTE — Telephone Encounter (Signed)
In order to complete the forms, I need the blank forms.

## 2017-12-17 NOTE — Telephone Encounter (Signed)
Phone call to patient. She states she is not sure what forms she needs. Reviewed notice from [facility] with patient stating patient's license will be revoked on 12/27/17 if form is not received by DMV by then.   Phone call to [facility], correct form located and placed in provider box for completion. Patient will need to pick up forms to complete her portion and then can be sent to [facility].

## 2017-12-17 NOTE — Telephone Encounter (Signed)
DMV form scanned into patients chart and also faxed to [facility]

## 2017-12-17 NOTE — Telephone Encounter (Signed)
Patient calling back checking status. Please advise. Call back [phone]

## 2017-12-17 NOTE — Telephone Encounter (Signed)
Rx refill request: Ambien 5 mg        Last filled: 11/20/17  # 30   LOV: 11/20/17   PCP: [place]: verified

## 2017-12-18 ENCOUNTER — Telehealth: Payer: Self-pay | Admitting: Physician Assistant

## 2017-12-18 NOTE — Telephone Encounter (Signed)
Copied from [place] [id number]. Topic: Quick Communication - Rx Refill/Question >> Dec 16, 2017  1:41 PM Aurelio Brash B wrote: Medication:zolpidem (AMBIEN) 5 MG tablet Has the patient contacted their pharmacy? no (Agent: If no, request that the patient contact the pharmacy for the refill.)  (Agent: If yes, when and what did the pharmacy advise?)  Preferred Pharmacy (with phone number or street name): [facility], [place]. [phone] (Phone) [phone] (Fax)      Agent: Please be advised that RX refills may take up to 3 business days. We ask that you follow-up with your pharmacy. >> Dec 18, 2017  2:36 PM Cleaster Corin, NT wrote: Pt. Calling to check on the status of med. Refill

## 2017-12-19 NOTE — Telephone Encounter (Signed)
F/u as needed per chart.

## 2017-12-19 NOTE — Telephone Encounter (Signed)
Per her last visit, she is establishing for primary care with Dr. Horald Pollen. Has she done that yet? If not, when is her appointment? If so, she should now get all her medications from her new provider.

## 2017-12-20 NOTE — Telephone Encounter (Signed)
PT is asking this go to  [facility]  And would like a phone call once this is complete   [id number] - [place], [place]

## 2017-12-20 NOTE — Telephone Encounter (Signed)
Pt did not follow up with Dr. Darron Doom per your notes. States she was unable to schedule or get there. Advised pt that you had discussed this during your last visit and that at this time we are unable to provide her with a refill.

## 2017-12-20 NOTE — Telephone Encounter (Signed)
Called patient to clarify if she was changing pharmacies and she is not. So she had decided to not change the pharmacy for this medication Lorrin Mais). So please send to [facility].

## 2017-12-25 ENCOUNTER — Other Ambulatory Visit: Payer: Self-pay

## 2017-12-25 ENCOUNTER — Inpatient Hospital Stay (HOSPITAL_COMMUNITY)
Admission: EM | Admit: 2017-12-25 | Discharge: 2017-12-27 | DRG: 641 | Disposition: A | Discharge status: Home Health | Payer: Medicare Other | Attending: Internal Medicine | Admitting: Internal Medicine

## 2017-12-25 ENCOUNTER — Encounter (HOSPITAL_COMMUNITY): Payer: Self-pay | Admitting: *Deleted

## 2017-12-25 DIAGNOSIS — F1010 Alcohol abuse, uncomplicated: Secondary | ICD-10-CM

## 2017-12-25 DIAGNOSIS — G4700 Insomnia, unspecified: Secondary | ICD-10-CM | POA: Diagnosis present

## 2017-12-25 DIAGNOSIS — Z79899 Other long term (current) drug therapy: Secondary | ICD-10-CM | POA: Diagnosis not present

## 2017-12-25 DIAGNOSIS — D638 Anemia in other chronic diseases classified elsewhere: Secondary | ICD-10-CM | POA: Diagnosis present

## 2017-12-25 DIAGNOSIS — E871 Hypo-osmolality and hyponatremia: Secondary | ICD-10-CM | POA: Diagnosis present

## 2017-12-25 DIAGNOSIS — E861 Hypovolemia: Secondary | ICD-10-CM | POA: Diagnosis present

## 2017-12-25 DIAGNOSIS — Z87891 Personal history of nicotine dependence: Secondary | ICD-10-CM

## 2017-12-25 DIAGNOSIS — E860 Dehydration: Secondary | ICD-10-CM | POA: Diagnosis present

## 2017-12-25 DIAGNOSIS — R413 Other amnesia: Secondary | ICD-10-CM

## 2017-12-25 DIAGNOSIS — N183 Chronic kidney disease, stage 3 unspecified: Secondary | ICD-10-CM | POA: Diagnosis present

## 2017-12-25 DIAGNOSIS — E876 Hypokalemia: Secondary | ICD-10-CM | POA: Diagnosis present

## 2017-12-25 DIAGNOSIS — I10 Essential (primary) hypertension: Secondary | ICD-10-CM | POA: Diagnosis present

## 2017-12-25 DIAGNOSIS — I129 Hypertensive chronic kidney disease with stage 1 through stage 4 chronic kidney disease, or unspecified chronic kidney disease: Secondary | ICD-10-CM | POA: Diagnosis present

## 2017-12-25 HISTORY — DX: Hypokalemia: E87.6

## 2017-12-25 HISTORY — DX: Hypo-osmolality and hyponatremia: E87.1

## 2017-12-25 HISTORY — DX: Alcohol abuse, uncomplicated: F10.10

## 2017-12-25 HISTORY — DX: Chronic kidney disease, stage 3 unspecified: N18.30

## 2017-12-25 LAB — COMPREHENSIVE METABOLIC PANEL
ALT: 15 U/L (ref 14–54)
AST: 20 U/L (ref 15–41)
Albumin: 3.8 g/dL (ref 3.5–5.0)
Alkaline Phosphatase: 63 U/L (ref 38–126)
Anion gap: 12 (ref 5–15)
BILIRUBIN TOTAL: 1.3 mg/dL — AB (ref 0.3–1.2)
BUN: 10 mg/dL (ref 6–20)
CO2: 24 mmol/L (ref 22–32)
Calcium: 9.5 mg/dL (ref 8.9–10.3)
Chloride: 87 mmol/L — ABNORMAL LOW (ref 101–111)
Creatinine, Ser: 1.15 mg/dL — ABNORMAL HIGH (ref 0.44–1.00)
GFR calc Af Amer: 53 mL/min — ABNORMAL LOW (ref >60)
GFR, EST NON AFRICAN AMERICAN: 45 mL/min — AB (ref >60)
Glucose, Bld: 95 mg/dL (ref 65–99)
POTASSIUM: 3.3 mmol/L — AB (ref 3.5–5.1)
Sodium: 123 mmol/L — ABNORMAL LOW (ref 135–145)
Total Protein: 6.6 g/dL (ref 6.5–8.1)

## 2017-12-25 LAB — URINALYSIS, ROUTINE W REFLEX MICROSCOPIC
Bacteria, UA: NONE SEEN
Bilirubin Urine: NEGATIVE
Glucose, UA: NEGATIVE mg/dL
KETONES UR: 20 mg/dL — AB
LEUKOCYTES UA: NEGATIVE
NITRITE: NEGATIVE
PROTEIN: NEGATIVE mg/dL
Specific Gravity, Urine: 1.005 (ref 1.005–1.030)
pH: 6 (ref 5.0–8.0)

## 2017-12-25 LAB — LIPASE, BLOOD: Lipase: 33 U/L (ref 11–51)

## 2017-12-25 LAB — CBC
HEMATOCRIT: 27.8 % — AB (ref 36.0–46.0)
Hemoglobin: 10 g/dL — ABNORMAL LOW (ref 12.0–15.0)
MCH: 31.5 pg (ref 26.0–34.0)
MCHC: 36 g/dL (ref 30.0–36.0)
MCV: 87.7 fL (ref 78.0–100.0)
PLATELETS: 259 10*3/uL (ref 150–400)
RBC: 3.17 MIL/uL — ABNORMAL LOW (ref 3.87–5.11)
RDW: 13.6 % (ref 11.5–15.5)
WBC: 5.9 10*3/uL (ref 4.0–10.5)

## 2017-12-25 MED ORDER — BISACODYL 5 MG PO TBEC: 5.0000 mg | DELAYED_RELEASE_TABLET | Freq: Every day | ORAL | Status: DC | PRN

## 2017-12-25 MED ORDER — ENOXAPARIN SODIUM 40 MG/0.4ML ~~LOC~~ SOLN
40.0000 mg | SUBCUTANEOUS | Status: DC
  Administered 2017-12-26 (×2): 40 mg via SUBCUTANEOUS
  Filled 2017-12-25 (×2): qty 0.4

## 2017-12-25 MED ORDER — POTASSIUM CHLORIDE CRYS ER 20 MEQ PO TBCR
30.0000 meq | EXTENDED_RELEASE_TABLET | Freq: Once | ORAL | Status: AC
  Administered 2017-12-25: 30 meq via ORAL
  Filled 2017-12-25: qty 1

## 2017-12-25 MED ORDER — SODIUM CHLORIDE 0.9 % IV BOLUS
1000.0000 mL | Freq: Once | INTRAVENOUS | Status: AC
  Administered 2017-12-25: 1000 mL via INTRAVENOUS

## 2017-12-25 MED ORDER — ZOLPIDEM TARTRATE 5 MG PO TABS
5.0000 mg | ORAL_TABLET | Freq: Every evening | ORAL | Status: DC | PRN
  Administered 2017-12-25: 5 mg via ORAL
  Filled 2017-12-25 (×2): qty 1

## 2017-12-25 MED ORDER — FOLIC ACID 1 MG PO TABS
1.0000 mg | ORAL_TABLET | Freq: Every day | ORAL | Status: DC
  Administered 2017-12-26 – 2017-12-27 (×2): 1 mg via ORAL
  Filled 2017-12-25 (×2): qty 1

## 2017-12-25 MED ORDER — LORAZEPAM 1 MG PO TABS: 1.0000 mg | ORAL_TABLET | Freq: Four times a day (QID) | ORAL | Status: DC | PRN

## 2017-12-25 MED ORDER — ADULT MULTIVITAMIN W/MINERALS CH
1.0000 | ORAL_TABLET | Freq: Every day | ORAL | Status: DC
  Administered 2017-12-26 – 2017-12-27 (×2): 1 via ORAL
  Filled 2017-12-25 (×2): qty 1

## 2017-12-25 MED ORDER — ACETAMINOPHEN 650 MG RE SUPP: 650.0000 mg | Freq: Four times a day (QID) | RECTAL | Status: DC | PRN

## 2017-12-25 MED ORDER — THIAMINE HCL 100 MG/ML IJ SOLN
100.0000 mg | Freq: Every day | INTRAMUSCULAR | Status: DC
  Administered 2017-12-26 – 2017-12-27 (×2): 100 mg via INTRAVENOUS
  Filled 2017-12-25 (×2): qty 2

## 2017-12-25 MED ORDER — HYDROCODONE-ACETAMINOPHEN 5-325 MG PO TABS: 1.0000 | ORAL_TABLET | ORAL | Status: DC | PRN

## 2017-12-25 MED ORDER — VITAMIN B-1 100 MG PO TABS
100.0000 mg | ORAL_TABLET | Freq: Every day | ORAL | Status: DC
  Filled 2017-12-25: qty 1

## 2017-12-25 MED ORDER — SENNOSIDES-DOCUSATE SODIUM 8.6-50 MG PO TABS: 1.0000 | ORAL_TABLET | Freq: Every evening | ORAL | Status: DC | PRN

## 2017-12-25 MED ORDER — SODIUM CHLORIDE 0.9% FLUSH
3.0000 mL | Freq: Two times a day (BID) | INTRAVENOUS | Status: DC
  Administered 2017-12-25 – 2017-12-27 (×4): 3 mL via INTRAVENOUS

## 2017-12-25 MED ORDER — ONDANSETRON HCL 4 MG PO TABS: 4.0000 mg | ORAL_TABLET | Freq: Four times a day (QID) | ORAL | Status: DC | PRN

## 2017-12-25 MED ORDER — SODIUM CHLORIDE 0.9% FLUSH
3.0000 mL | Freq: Two times a day (BID) | INTRAVENOUS | Status: DC
  Administered 2017-12-26 – 2017-12-27 (×3): 3 mL via INTRAVENOUS

## 2017-12-25 MED ORDER — LORAZEPAM 2 MG/ML IJ SOLN: 1.0000 mg | Freq: Four times a day (QID) | INTRAMUSCULAR | Status: DC | PRN

## 2017-12-25 MED ORDER — ACETAMINOPHEN 325 MG PO TABS: 650.0000 mg | ORAL_TABLET | Freq: Four times a day (QID) | ORAL | Status: DC | PRN

## 2017-12-25 MED ORDER — LORAZEPAM 1 MG PO TABS: 0.0000 mg | ORAL_TABLET | Freq: Two times a day (BID) | ORAL | Status: DC

## 2017-12-25 MED ORDER — LORAZEPAM 1 MG PO TABS
0.0000 mg | ORAL_TABLET | Freq: Four times a day (QID) | ORAL | Status: DC
  Administered 2017-12-25 – 2017-12-26 (×2): 1 mg via ORAL
  Filled 2017-12-25: qty 2
  Filled 2017-12-25 (×2): qty 1

## 2017-12-25 MED ORDER — SODIUM CHLORIDE 0.9% FLUSH: 3.0000 mL | INTRAVENOUS | Status: DC | PRN

## 2017-12-25 MED ORDER — ONDANSETRON HCL 4 MG/2ML IJ SOLN: 4.0000 mg | Freq: Four times a day (QID) | INTRAMUSCULAR | Status: DC | PRN

## 2017-12-25 MED ORDER — SODIUM CHLORIDE 0.9 % IV SOLN: 250.0000 mL | INTRAVENOUS | Status: DC | PRN
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
## 2017-12-25 NOTE — ED Triage Notes (Signed)
Pt states that she probably had 102 fever and did not take anything for it, she said her room was so hot and concerned the air conditioner broke down.  She saw MD yesterday for symptoms of uti and was worked up.  Pt states she vomited all night.  Pt states she has some discomfort with urinating.  Pt reports some acid feeling last night and took tums. No chest pain. Pt was hydrated in office and given IM injection yesterday.

## 2017-12-25 NOTE — ED Notes (Signed)
ED Provider at bedside. 

## 2017-12-26 ENCOUNTER — Other Ambulatory Visit: Payer: Self-pay

## 2017-12-26 DIAGNOSIS — E871 Hypo-osmolality and hyponatremia: Principal | ICD-10-CM

## 2017-12-26 LAB — BASIC METABOLIC PANEL
Anion gap: 11 (ref 5–15)
Anion gap: 12 (ref 5–15)
Anion gap: 9 (ref 5–15)
BUN: 8 mg/dL (ref 6–20)
BUN: 8 mg/dL (ref 6–20)
BUN: 9 mg/dL (ref 6–20)
CALCIUM: 8.9 mg/dL (ref 8.9–10.3)
CO2: 24 mmol/L (ref 22–32)
CO2: 24 mmol/L (ref 22–32)
CO2: 25 mmol/L (ref 22–32)
CREATININE: 1.07 mg/dL — AB (ref 0.44–1.00)
CREATININE: 1.09 mg/dL — AB (ref 0.44–1.00)
Calcium: 9 mg/dL (ref 8.9–10.3)
Calcium: 9.1 mg/dL (ref 8.9–10.3)
Chloride: 91 mmol/L — ABNORMAL LOW (ref 101–111)
Chloride: 93 mmol/L — ABNORMAL LOW (ref 101–111)
Chloride: 98 mmol/L — ABNORMAL LOW (ref 101–111)
Creatinine, Ser: 1.07 mg/dL — ABNORMAL HIGH (ref 0.44–1.00)
GFR calc Af Amer: 56 mL/min — ABNORMAL LOW (ref >60)
GFR calc Af Amer: 57 mL/min — ABNORMAL LOW (ref >60)
GFR calc Af Amer: 57 mL/min — ABNORMAL LOW (ref >60)
GFR, EST NON AFRICAN AMERICAN: 48 mL/min — AB (ref >60)
GFR, EST NON AFRICAN AMERICAN: 49 mL/min — AB (ref >60)
GFR, EST NON AFRICAN AMERICAN: 49 mL/min — AB (ref >60)
GLUCOSE: 77 mg/dL (ref 65–99)
GLUCOSE: 89 mg/dL (ref 65–99)
GLUCOSE: 93 mg/dL (ref 65–99)
POTASSIUM: 3.8 mmol/L (ref 3.5–5.1)
POTASSIUM: 4.3 mmol/L (ref 3.5–5.1)
Potassium: 3 mmol/L — ABNORMAL LOW (ref 3.5–5.1)
SODIUM: 127 mmol/L — AB (ref 135–145)
Sodium: 129 mmol/L — ABNORMAL LOW (ref 135–145)
Sodium: 131 mmol/L — ABNORMAL LOW (ref 135–145)

## 2017-12-26 LAB — CBG MONITORING, ED: Glucose-Capillary: 106 mg/dL — ABNORMAL HIGH (ref 65–99)

## 2017-12-26 LAB — MAGNESIUM
MAGNESIUM: 1.8 mg/dL (ref 1.7–2.4)
Magnesium: 1.6 mg/dL — ABNORMAL LOW (ref 1.7–2.4)

## 2017-12-26 LAB — NA AND K (SODIUM & POTASSIUM), RAND UR
Potassium Urine: 21 mmol/L
Sodium, Ur: 32 mmol/L

## 2017-12-26 LAB — OSMOLALITY, URINE: OSMOLALITY UR: 214 mosm/kg — AB (ref 300–900)

## 2017-12-26 MED ORDER — POTASSIUM CHLORIDE IN NACL 40-0.9 MEQ/L-% IV SOLN
INTRAVENOUS | Status: AC
  Administered 2017-12-26: 65 mL/h via INTRAVENOUS
  Filled 2017-12-26: qty 1000

## 2017-12-26 MED ORDER — ALUM & MAG HYDROXIDE-SIMETH 200-200-20 MG/5ML PO SUSP
30.0000 mL | ORAL | Status: DC | PRN
  Administered 2017-12-26 (×2): 30 mL via ORAL
  Filled 2017-12-26 (×2): qty 30

## 2017-12-26 MED ORDER — SODIUM CHLORIDE 0.9 % IV SOLN
INTRAVENOUS | Status: DC
  Administered 2017-12-26 – 2017-12-27 (×2): via INTRAVENOUS

## 2017-12-26 NOTE — Progress Notes (Signed)
PROGRESS NOTE  Allison Chavez OFB:[id number] DOB: [birth year] DOA: 12/25/2017 PCP: Hayden Rasmussen, MD  HPI/Recap of past 24 hours:  Feeling better, denies pain, no n/v  She reports she think the nitrofurontoin made her sick and had n/v  Poor historian , tangetial though process  Assessment/Plan: Principal Problem:   Hyponatremia Active Problems:   HTN (hypertension)   CKD (chronic kidney disease), stage III ([place])   Hypokalemia   Alcohol abuse  Hyponatremia/hypokalemia/hypomagnesemia: Likely from dehydration from n/v Improving  N/v: resolved, Patient think it is from nitrofurantoin  CKDIII Cr at baseline, renal dosing meds  She reports used to drink alcohol but quit several months , no recent alcohol use Not sure if reliable She is a very poor historian She appear to have some mild memory deficit, she has tangential though process, but she denies memory impairment.  She lives by her self, she denies unsteady gait, but there are several bruises in lower extremity she could not account for. Will get physical therapy. He husband who does not live with her will come to the hospital tomorrow, will get collateral information.   Code Status: full  Family Communication: patient and husband over the phone  Disposition Plan: home tomorrow if sodium normalizes and no more n/v   Consultants:  Case manager  Physical therapy  Procedures:  none  Antibiotics:  none   Objective: BP 122/72 (BP Location: Right Arm)   Pulse 68   Temp 99.1 F (37.3 C) (Oral)   Resp 16   Ht 5\' 2"  (1.575 m)   Wt 65.6 kg (144 lb 10 oz)   SpO2 99%   BMI 26.45 kg/m   Intake/Output Summary (Last 24 hours) at 12/26/2017 1611 Last data filed at 12/25/2017 2319 Gross per 24 hour  Intake 2000 ml  Output -  Net 2000 ml   Filed Weights   12/25/17 2200  Weight: 65.6 kg (144 lb 10 oz)    Exam: Patient is examined daily including today on 12/26/2017, exams remain the same as of  yesterday except that has changed    General:  NAD  Cardiovascular: RRR  Respiratory: CTABL  Abdomen: Soft/ND/NT, positive BS  Musculoskeletal: No Edema  Neuro: alert, oriented , appear to have impaired immediate recall 2/3.  Data Reviewed: Basic Metabolic Panel: Recent Labs  Lab 12/25/17 1540 12/25/17 2354 12/26/17 0402 12/26/17 1131  NA 123* 127* 129* 131*  K 3.3* 3.0* 3.8 4.3  CL 87* 91* 93* 98*  CO2 24 24 25 24   GLUCOSE 95 89 77 93  BUN 10 8 9 8   CREATININE 1.15* 1.09* 1.07* 1.07*  CALCIUM 9.5 9.0 8.9 9.1  MG  --  1.6*  --  1.8   Liver Function Tests: Recent Labs  Lab 12/25/17 1540  AST 20  ALT 15  ALKPHOS 63  BILITOT 1.3*  PROT 6.6  ALBUMIN 3.8   Recent Labs  Lab 12/25/17 1540  LIPASE 33   No results for input(s): AMMONIA in the last 168 hours. CBC: Recent Labs  Lab 12/25/17 1540  WBC 5.9  HGB 10.0*  HCT 27.8*  MCV 87.7  PLT 259   Cardiac Enzymes:   No results for input(s): CKTOTAL, CKMB, CKMBINDEX, TROPONINI in the last 168 hours. BNP (last 3 results) No results for input(s): BNP in the last 8760 hours.  ProBNP (last 3 results) No results for input(s): PROBNP in the last 8760 hours.  CBG: Recent Labs  Lab 12/26/17 1230  GLUCAP 106*  No results found for this or any previous visit (from the past 240 hour(s)).   Studies: No results found.  Scheduled Meds: . enoxaparin (LOVENOX) injection  40 mg Subcutaneous Q24H  . folic acid  1 mg Oral Daily  . LORazepam  0-4 mg Oral Q6H   Followed by  . [START ON 12/27/2017] LORazepam  0-4 mg Oral Q12H  . multivitamin with minerals  1 tablet Oral Daily  . sodium chloride flush  3 mL Intravenous Q12H  . sodium chloride flush  3 mL Intravenous Q12H  . thiamine  100 mg Oral Daily   Or  . thiamine  100 mg Intravenous Daily    Continuous Infusions: . sodium chloride    . sodium chloride       Time spent: 35 mins , more than 50% time spent on coordination of care, very poor  historian, talked to the patient and husband over the phone with patient's permission.  I have personally reviewed and interpreted on  12/26/2017 daily labs, tele strips, imagings as discussed above under date review session and assessment and plans.  I reviewed all nursing notes, pharmacy notes,  vitals, pertinent old records  I have discussed plan of care as described above with RN , patient and family on 12/26/2017   Florencia Reasons MD, PhD  [facility] Pager [phone]. If 7PM-7AM, please contact night-coverage at [email or web address], password [facility] 12/26/2017, 4:11 PM  LOS: 1 day

## 2017-12-27 LAB — COMPREHENSIVE METABOLIC PANEL
ALBUMIN: 3 g/dL — AB (ref 3.5–5.0)
ALT: 11 U/L — ABNORMAL LOW (ref 14–54)
AST: 16 U/L (ref 15–41)
Alkaline Phosphatase: 52 U/L (ref 38–126)
Anion gap: 6 (ref 5–15)
BUN: 10 mg/dL (ref 6–20)
CHLORIDE: 104 mmol/L (ref 101–111)
CO2: 21 mmol/L — AB (ref 22–32)
Calcium: 8.2 mg/dL — ABNORMAL LOW (ref 8.9–10.3)
Creatinine, Ser: 1.12 mg/dL — ABNORMAL HIGH (ref 0.44–1.00)
GFR calc Af Amer: 54 mL/min — ABNORMAL LOW (ref >60)
GFR calc non Af Amer: 47 mL/min — ABNORMAL LOW (ref >60)
GLUCOSE: 90 mg/dL (ref 65–99)
POTASSIUM: 4 mmol/L (ref 3.5–5.1)
Sodium: 131 mmol/L — ABNORMAL LOW (ref 135–145)
Total Bilirubin: 0.7 mg/dL (ref 0.3–1.2)
Total Protein: 5.2 g/dL — ABNORMAL LOW (ref 6.5–8.1)

## 2017-12-27 LAB — GLUCOSE, CAPILLARY: Glucose-Capillary: 87 mg/dL (ref 65–99)

## 2017-12-27 LAB — CBC
HCT: 25.6 % — ABNORMAL LOW (ref 36.0–46.0)
Hemoglobin: 8.9 g/dL — ABNORMAL LOW (ref 12.0–15.0)
MCH: 32.5 pg (ref 26.0–34.0)
MCHC: 34.8 g/dL (ref 30.0–36.0)
MCV: 93.4 fL (ref 78.0–100.0)
Platelets: 218 10*3/uL (ref 150–400)
RBC: 2.74 MIL/uL — AB (ref 3.87–5.11)
RDW: 14.6 % (ref 11.5–15.5)
WBC: 6 10*3/uL (ref 4.0–10.5)

## 2017-12-27 LAB — MAGNESIUM: Magnesium: 1.6 mg/dL — ABNORMAL LOW (ref 1.7–2.4)

## 2017-12-27 MED ORDER — MAGNESIUM SULFATE 2 GM/50ML IV SOLN
2.0000 g | Freq: Once | INTRAVENOUS | Status: AC
  Administered 2017-12-27: 2 g via INTRAVENOUS
  Filled 2017-12-27: qty 50

## 2017-12-27 MED ORDER — MAGNESIUM GLUCONATE 30 MG PO TABS: 30.0000 mg | ORAL_TABLET | Freq: Every day | ORAL | 0 refills | Status: DC

## 2017-12-27 MED ORDER — FOLIC ACID 1 MG PO TABS: 1.0000 mg | ORAL_TABLET | Freq: Every day | ORAL | 0 refills | Status: DC

## 2017-12-27 MED ORDER — THIAMINE HCL 100 MG PO TABS: 100.0000 mg | ORAL_TABLET | Freq: Every day | ORAL | 0 refills | Status: AC

## 2017-12-27 NOTE — Discharge Summary (Signed)
Discharge Summary  Allison Chavez WUJ:[id number] DOB: Feb 05, 1941  PCP: Allison Rasmussen, MD  Admit date: 12/25/2017 Discharge date: 12/27/2017  Time spent: <64mins  Recommendations for Outpatient Follow-up:  1. F/u with PMD within a week  for hospital discharge follow up, repeat cbc/bmp at follow up 2. F/u with neurology for memory eval  Discharge Diagnoses:  Active Hospital Problems   Diagnosis Date Noted  . Hyponatremia 12/25/2017  . CKD (chronic kidney disease), stage III ([place]) 12/25/2017  . Hypokalemia 12/25/2017  . Alcohol abuse 12/25/2017  . HTN (hypertension) 03/07/2012    Resolved Hospital Problems  No resolved problems to display.    Discharge Condition: stable  Diet recommendation: regular diet  Filed Weights   12/25/17 2200 12/27/17 0319  Weight: 65.6 kg (144 lb 10 oz) 68.9 kg (151 lb 14.4 oz)    History of present illness: (per admitting MD Dr Allison Chavez) PCP: Allison Rasmussen, MD   Patient coming from: Home  Chief Complaint: Nausea, vomiting, malaise   HPI: Allison Chavez is a 77 y.o. female with medical history significant for history of depression and anxiety, insomnia, hypertension, and chronic kidney disease stage III, now presenting to the emergency department with a general malaise, nausea, and nonbloody vomiting.  Patient is a poor historian and her friend at the bedside assist with the history.  She has reportedly been treated recently with nitrofurantoin and IM Rocephin for UTI and has been experiencing recent nausea with nonbloody vomiting.  She denies any abdominal pain or diarrhea.  She reports drinking "lots" of water out of fear that she had become dehydrated.  She perseverates on the death of her brother last 2023-06-09 and her perception that other family members have not shown adequate respect or concern after his passing. Her friend feels that she close to her baseline in terms of cognition, but explains that she has been under a lot of stress. She has  recently established with a new PCP.    ED Course: Upon arrival to the ED, patient is found to be afebrile, saturating well on room air, and with vitals otherwise stable.   EKG features a sinus rhythm with nonspecific ST abnormality. Chemistory panel is notable for sodium of 123, potassium 3.3, and creatinine 1.15, better than priors. CBC features a normocytic anemia with hemoglobin of 10.0.  She was treated with 1 L of normal saline in the ED, remains hemodynamically stable, and will be admitted for ongoing evaluation and management of hyponatremia.    Hospital Course:  Principal Problem:   Hyponatremia Active Problems:   HTN (hypertension)   CKD (chronic kidney disease), stage III (HCC)   Hypokalemia   Alcohol abuse   Hyponatremia/hypokalemia/hypomagnesemia: Likely from dehydration from n/v Replaced. pmd to repeat labs at hospital discharge follow up.  N/v: resolved, Patient thinks it is from nitrofurantoin  CKDIII Cr at baseline, renal dosing meds  Normocytic Anemia: No overt bleeding, she does has multiple lower extremity bruises. pmd to repeat cbc at hospital discharge follow up.  She reports used to drink alcohol but quit several months , no recent alcohol use Not sure if reliable She is a very poor historian She appear to have some mild memory deficit, she has tangential though process, but she denies memory impairment.  She lives by her self, she denies unsteady gait, but there are several bruises in lower extremity she could not account for. Will get physical therapy. Her husband does not live with her, her husband agrees to home  safty eval, home health Pt and social worker and agrees to neurology referral for memory eval.   Code Status: full  Family Communication: patient and husband at bedside  Disposition Plan: home with home health   Consultants:  Case manager  Physical therapy  Procedures:  none  Antibiotics:  none   Discharge  Exam: BP 109/68 (BP Location: Right Arm)   Pulse 68   Temp 98.9 F (37.2 C) (Oral)   Resp 18   Ht 5\' 2"  (1.575 m)   Wt 68.9 kg (151 lb 14.4 oz)   SpO2 98%   BMI 27.78 kg/m   General: NAD Cardiovascular: RRR Respiratory: CTABL Extremity: multiple bruises bilateral lower extremity   Discharge Instructions You were cared for by a hospitalist during your hospital stay. If you have any questions about your discharge medications or the care you received while you were in the hospital after you are discharged, you can call the unit and asked to speak with the hospitalist on call if the hospitalist that took care of you is not available. Once you are discharged, your primary care physician will handle any further medical issues. Please note that NO REFILLS for any discharge medications will be authorized once you are discharged, as it is imperative that you return to your primary care physician (or establish a relationship with a primary care physician if you do not have one) for your aftercare needs so that they can reassess your need for medications and monitor your lab values.  Discharge Instructions    Ambulatory referral to Neurology   Complete by:  As directed    An appointment is requested in approximately: 4 weeks For memory impairment   Diet general   Complete by:  As directed    Increase activity slowly   Complete by:  As directed      Allergies as of 12/27/2017      Reactions   Ace Inhibitors Cough   Cough   Sulfa Antibiotics Hives   Wellbutrin [bupropion] Rash   Only with generic, immediate release      Medication List    STOP taking these medications   atorvastatin 10 MG tablet Commonly known as:  LIPITOR   losartan-hydrochlorothiazide 50-12.5 MG tablet Commonly known as:  HYZAAR   zolpidem 5 MG tablet Commonly known as:  AMBIEN     TAKE these medications   folic acid 1 MG tablet Commonly known as:  FOLVITE Take 1 tablet (1 mg total) by mouth daily. Start  taking on:  12/28/2017   magnesium gluconate 30 MG tablet Commonly known as:  MAGONATE Take 1 tablet (30 mg total) by mouth daily.   thiamine 100 MG tablet Take 1 tablet (100 mg total) by mouth daily. Start taking on:  12/28/2017      Allergies  Allergen Reactions  . Ace Inhibitors Cough    Cough   . Sulfa Antibiotics Hives  . Wellbutrin [Bupropion] Rash    Only with generic, immediate release   Follow-up Information    Allison Rasmussen, MD Follow up in 1 week(s).   Specialty:  Family Medicine Why:  hospital discharge follow up, repeat cbc/bmp at follow up. Contact information: [address] [place] [address] [phone]        [facility] Follow up in 3 week(s).   Why:  memory impairment Contact information: [address]     [place] [address] [phone]  The results of significant diagnostics from this hospitalization (including imaging, microbiology, ancillary and laboratory) are listed below for reference.    Significant Diagnostic Studies: No results found.  Microbiology: No results found for this or any previous visit (from the past 240 hour(s)).   Labs: Basic Metabolic Panel: Recent Labs  Lab 12/25/17 1540 12/25/17 2354 12/26/17 0402 12/26/17 1131 12/27/17 0346  NA 123* 127* 129* 131* 131*  K 3.3* 3.0* 3.8 4.3 4.0  CL 87* 91* 93* 98* 104  CO2 24 24 25 24  21*  GLUCOSE 95 89 77 93 90  BUN 10 8 9 8 10   CREATININE 1.15* 1.09* 1.07* 1.07* 1.12*  CALCIUM 9.5 9.0 8.9 9.1 8.2*  MG  --  1.6*  --  1.8 1.6*   Liver Function Tests: Recent Labs  Lab 12/25/17 1540 12/27/17 0346  AST 20 16  ALT 15 11*  ALKPHOS 63 52  BILITOT 1.3* 0.7  PROT 6.6 5.2*  ALBUMIN 3.8 3.0*   Recent Labs  Lab 12/25/17 1540  LIPASE 33   No results for input(s): AMMONIA in the last 168 hours. CBC: Recent Labs  Lab 12/25/17 1540 12/27/17 0346  WBC 5.9 6.0  HGB 10.0* 8.9*  HCT  27.8* 25.6*  MCV 87.7 93.4  PLT 259 218   Cardiac Enzymes: No results for input(s): CKTOTAL, CKMB, CKMBINDEX, TROPONINI in the last 168 hours. BNP: BNP (last 3 results) No results for input(s): BNP in the last 8760 hours.  ProBNP (last 3 results) No results for input(s): PROBNP in the last 8760 hours.  CBG: Recent Labs  Lab 12/26/17 1230 12/27/17 0742  GLUCAP 106* 87       Signed:  Florencia Reasons MD, PhD  [facility] 12/27/2017, 11:23 AM

## 2017-12-27 NOTE — Evaluation (Addendum)
Physical Therapy Evaluation Patient Details Name: Allison Chavez MRN: [id number] DOB: 1941/03/02 Today's Date: 12/27/2017   History of Present Illness  Allison Chavez is a 77 y.o. female with medical history significant for history of depression and anxiety, insomnia, hypertension, and chronic kidney disease stage III, now presenting to the emergency department with a general malaise, nausea, and nonbloody vomiting.  Patient is a poor historian and her friend at the bedside assist with the history.  She has reportedly been treated recently with nitrofurantoin and IM Rocephin for UTI and has been experiencing recent nausea with nonbloody vomiting.  She denies any abdominal pain or diarrhea.  She reports drinking "lots" of water out of fear that she had become dehydrated.  She perseverates on the death of her brother last 06/02/2023 and her perception that other family members have not shown adequate respect or concern after his passing. Her friend feels that she close to her baseline in terms of cognition, but explains that she has been under a lot of stress. She has recently established with a new PCP.    Clinical Impression  Pt admitted with above diagnosis. Pt currently with functional limitations due to the deficits listed below (see PT Problem List). Pt was able to ambulate without LOB.  21/24 on DGI suggesting low risk of falls.  Will benefit from HHPT safety eval and HH SW c/s.  D/C skilled PT as pt going home today.   Follow Up Recommendations Home health PT;Supervision - Intermittent    Equipment Recommendations  None recommended by PT    Recommendations for Other Services       Precautions / Restrictions Precautions Precautions: Fall Restrictions Weight Bearing Restrictions: No      Mobility  Bed Mobility Overal bed mobility: Independent                Transfers Overall transfer level: Independent                  Ambulation/Gait Ambulation/Gait assistance:  Independent Ambulation Distance (Feet): 350 Feet Assistive device: None Gait Pattern/deviations: Step-through pattern;Decreased stride length   Gait velocity interpretation: 1.31 - 2.62 ft/sec, indicative of limited community ambulator General Gait Details: Pt is functioning well overall and is most likely at baseline.   Stairs Stairs: Yes Stairs assistance: Supervision Stair Management: One rail Left;Step to pattern;Forwards Number of Stairs: 3 General stair comments: did use rail.  No LOB  Wheelchair Mobility    Modified Rankin (Stroke Patients Only)       Balance                                 Standardized Balance Assessment Standardized Balance Assessment : Dynamic Gait Index   Dynamic Gait Index Level Surface: Normal Change in Gait Speed: Normal Gait with Horizontal Head Turns: Mild Impairment Gait with Vertical Head Turns: Normal Gait and Pivot Turn: Normal Step Over Obstacle: Mild Impairment Step Around Obstacles: Normal Steps: Mild Impairment Total Score: 21       Pertinent Vitals/Pain Pain Assessment: No/denies pain    Home Living Family/patient expects to be discharged to:: Private residence Living Arrangements: Alone Available Help at Discharge: Family;Friend(s);Available 24 hours/day(for a few days) Type of Home: House Home Access: Level entry     Home Layout: One level Home Equipment: None      Prior Function Level of Independence: Independent  Hand Dominance   Dominant Hand: Left    Extremity/Trunk Assessment   Upper Extremity Assessment Upper Extremity Assessment: Defer to OT evaluation    Lower Extremity Assessment Lower Extremity Assessment: Generalized weakness    Cervical / Trunk Assessment Cervical / Trunk Assessment: Normal  Communication   Communication: No difficulties  Cognition Arousal/Alertness: Awake/alert Behavior During Therapy: Flat affect Overall Cognitive Status:  Impaired/Different from baseline Area of Impairment: Attention;Safety/judgement;Following commands;Memory;Awareness;Problem solving                   Current Attention Level: Focused Memory: Decreased short-term memory Following Commands: Follows one step commands consistently Safety/Judgement: Decreased awareness of safety Awareness: Intellectual Problem Solving: Requires verbal cues General Comments: Pt confabulates at times. Feel that pt is at her baseline.       General Comments General comments (skin integrity, edema, etc.): 21/24 on DGI. Low risk of falls in challengeing situation.     Exercises     Assessment/Plan    PT Assessment All further PT needs can be met in the next venue of care  PT Problem List Decreased activity tolerance;Decreased mobility       PT Treatment Interventions Gait training;Stair training;DME instruction;Functional mobility training;Therapeutic activities;Therapeutic exercise;Balance training;Patient/family education    PT Goals (Current goals can be found in the Care Plan section)  Acute Rehab PT Goals PT Goal Formulation: All assessment and education complete, DC therapy    Frequency     Barriers to discharge        Co-evaluation               AM-PAC PT "6 Clicks" Daily Activity  Outcome Measure Difficulty turning over in bed (including adjusting bedclothes, sheets and blankets)?: None Difficulty moving from lying on back to sitting on the side of the bed? : None Difficulty sitting down on and standing up from a chair with arms (e.g., wheelchair, bedside commode, etc,.)?: None Help needed moving to and from a bed to chair (including a wheelchair)?: None Help needed walking in hospital room?: None Help needed climbing 3-5 steps with a railing? : None 6 Click Score: 24    End of Session Equipment Utilized During Treatment: Gait belt Activity Tolerance: Patient limited by fatigue Patient left: with call bell/phone within  reach;in bed Nurse Communication: Mobility status PT Visit Diagnosis: Muscle weakness (generalized) (M62.81)    Time: 9741-6384 PT Time Calculation (min) (ACUTE ONLY): 23 min   Charges:   PT Evaluation $PT Eval Moderate Complexity: 1 Mod PT Treatments $Gait Training: 8-22 mins   PT G Codes:        Jaydeen Darley,PT Acute Rehabilitation [phone] (pager)   Denice Paradise 12/27/2017, 2:56 PM

## 2017-12-27 NOTE — Care Management Note (Signed)
Case Management Note  Patient Details  Name: Allison Chavez MRN: [id number] Date of Birth: 06-05-1941  Subjective/Objective:    From home alone, her spouse does not live with her.  She states she lives on one level and she has no DME at home and states she does not need any.  NCM gave her the [employer] she chose [facility] for HHPT, and social work.  Referral given to [facility] with [facility], soc will begin 24-48 hrs post dc.                 Action/Plan: DC home with [facility] services today.  Expected Discharge Date:  12/27/17               Expected Discharge Plan:  [place]  In-House Referral:     Discharge planning Services  CM Consult  Post Acute Care Choice:  Home Health Choice offered to:  Patient  DME Arranged:    DME Agency:     HH Arranged:  PT, Social Work [employer] Agency:  [place]  Status of Service:  Completed, signed off  If discussed at [employer], dates discussed:    Additional Comments:  Zenon Mayo, RN 12/27/2017, 12:09 PM

## 2017-12-27 NOTE — Progress Notes (Signed)
Consult to do AD.  Condition of patient prevents chaplain from doing this since it is to be initiated by patient.  Patient is to go home soon. Conard Novak, Chaplain   12/27/17 1200  Clinical Encounter Type  Visited With Other (Comment) (Asked nurse if patient alert and able to do AD-no she is not)  Visit Type Other (Comment) (Recieved consult for AD-not able to due to patient condition)  Referral From Physician  Consult/Referral To Chaplain  Recommendations  (patient to go home soon)

## 2017-12-27 NOTE — Progress Notes (Signed)
Pt and pt's husband both informed and agreed to be discharged. Pt alert and oriented x4 and vital signs stable. Removed pt IV and went over discharge instructions with pt. Answered all questions. Pt wheeled off unit in wheelchair by nurse tech with discharge packet, and belongings.

## 2017-12-27 NOTE — Consult Note (Signed)
[facility] Primary Care Navigator  12/27/2017  Allison Chavez [birth year] [id number]   Met withpatientat the bedsideto identify possible discharge needs. Patient verbalized having "weakness; had reportedly been treated recently with Nitrofurantoin and IM Rocephin for UTI and developed nausea and vomiting which led to this admission. (Hyponatremia/ hypokalemia/ hypomagnesemia likely from dehydration from nausea/ vomiting; alcohol abuse)  Patientreportshaving established care with a new primary care provider- Dr.Karen Darron Doom with [facility] and [facility] asherprimary care physician.   Patientshared [place] to obtainmedications withoutdifficulty.   Patientstatesthatshemanagesher ownmedications at [employer] out of the containers.  Patient reports thatshehas been using [place] and [employer]) for transportation to her doctors' appointments. Her friend Lattie Haw) also provides transportation if needed.  Patient states that she lives alone, spouse does not live with her and no other family members living close by, but she has neighbors/ friends ([place], [place] and [place]) who can provide assistance with needs at home after discharge.  Anticipatedplan for dischargeishomewith home health services per patient.  Patientvoiced understanding to call primary care provider's office whenshereturns home,for a post discharge follow-upvisitwithin 1- 2 weeksor sooner if needs arise.Patient letter (with PCP's contact number) was provided asareminder.  Explained topatientregardingTHN CM services available for health management and resourcesat homebutshedenies anyneeds or concernsat this point. Sheexpressedunderstandingto seek referral from primary care provider to [facility] care management ifnecessaryand deemed appropriate for anyservices in the future.  [facility] care management  information was provided for future needs thatshe may have.   Patienthadverbally agreedand optedforEMMIcalls tofollow- up  with herrecoveryat home.   Referral made for [facility] General calls after discharge.   For additional questions please contact:  Edwena Felty A. Cartha Rotert, BSN, RN-BC [facility] PRIMARY CARE Navigator Cell: [phone]

## 2017-12-29 LAB — VITAMIN B1: Vitamin B1 (Thiamine): 117.4 nmol/L (ref 66.5–200.0)

## 2018-01-07 ENCOUNTER — Telehealth: Payer: Self-pay

## 2018-01-07 NOTE — Telephone Encounter (Signed)
Phone call to patient. She states she would like to continue seeing Chelle at [facility], but has an appointment with Dr. Horald Pollen this week. She would like Chelle to be aware of this. Provider, Juluis Rainier.

## 2018-01-07 NOTE — Telephone Encounter (Signed)
Copied from [place] [phone]. Topic: Inquiry >> Jan 03, 2018 11:27 AM Conception Chancy, NT wrote: Reason for CRM: patient is calling and states she has been trying to get into Chelle jeffrey at [place] and I informed her she would not be going to [place] until 01/08/18. She would like Chelle to call her before then.

## 2018-01-07 NOTE — Telephone Encounter (Signed)
Phone call to patient. Left detailed message for patient to return call and relay her needs per Chelle. Can see from last chart note that she was planning on continuing care with Horald Pollen. Chelle's earliest appointment is on 7/15 at [facility].  If patient calls back, does she need anything specifically from [place] (example: labs, refills, etc)?

## 2018-01-07 NOTE — Telephone Encounter (Signed)
Please see previous messages.  My last day at PCP is tomorrow, 01/08/2018. My first day at [facility] may be as early as 6/25, but currently, the earliest appointment there is 7/15 (while we are waiting on credentialing).  Please get the details on what the patient needs.

## 2018-01-07 NOTE — Telephone Encounter (Signed)
Pt states she thinks Dr Darron Doom wants her to see both of them. Pt has appt with Dr Loralie Champagne Friday, 6/14 at 11:40. Pt states her understanding is the 2 of them were going to coordinate her meds and needs.  Pt states she is stuck in the middle not sure what to do.  But right now, pt doesn't need any meds.  Just a direction

## 2018-01-07 NOTE — Telephone Encounter (Signed)
She doesn't need both of [place]. She should keep her appointment with Dr. Darron Doom. If she is happy with the plan that Dr. Darron Doom provides, which I suspect she will be, she should follow-up.  I am happy to see her, but only one of [place] will be prescribing for her, and it doesn't make sense to see [place] both.

## 2018-01-08 NOTE — Telephone Encounter (Signed)
Left detailed message.   

## 2018-01-08 NOTE — Telephone Encounter (Signed)
Left message detailed

## 2018-03-04 ENCOUNTER — Encounter: Payer: Self-pay | Admitting: Diagnostic Neuroimaging

## 2018-03-04 ENCOUNTER — Other Ambulatory Visit: Payer: Self-pay | Admitting: Family Medicine

## 2018-03-04 ENCOUNTER — Ambulatory Visit: Payer: Medicare Other | Admitting: Diagnostic Neuroimaging

## 2018-03-04 VITALS — BP 147/71 | HR 74 | Ht 62.0 in | Wt 152.4 lb

## 2018-03-04 DIAGNOSIS — F1010 Alcohol abuse, uncomplicated: Secondary | ICD-10-CM

## 2018-03-04 DIAGNOSIS — R5381 Other malaise: Secondary | ICD-10-CM

## 2018-03-04 DIAGNOSIS — R413 Other amnesia: Secondary | ICD-10-CM | POA: Diagnosis not present
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
## 2018-03-22 ENCOUNTER — Other Ambulatory Visit: Payer: Self-pay | Admitting: Family Medicine

## 2018-03-22 DIAGNOSIS — M8580 Other specified disorders of bone density and structure, unspecified site: Secondary | ICD-10-CM

## 2018-03-22 DIAGNOSIS — Z1231 Encounter for screening mammogram for malignant neoplasm of breast: Secondary | ICD-10-CM

## 2018-03-26 ENCOUNTER — Ambulatory Visit: Payer: Medicare Other | Admitting: Physician Assistant

## 2018-11-27 ENCOUNTER — Ambulatory Visit
Admission: RE | Admit: 2018-11-27 | Discharge: 2018-11-27 | Disposition: A | Discharge status: Home / Self Care | Payer: Medicare Other | Source: Ambulatory Visit | Attending: Family Medicine | Admitting: Family Medicine

## 2018-11-27 ENCOUNTER — Other Ambulatory Visit: Payer: Self-pay

## 2018-11-27 ENCOUNTER — Other Ambulatory Visit: Payer: Self-pay | Admitting: Family Medicine

## 2018-11-27 DIAGNOSIS — S0990XA Unspecified injury of head, initial encounter: Secondary | ICD-10-CM

## 2019-02-12 ENCOUNTER — Other Ambulatory Visit: Payer: Self-pay | Admitting: Family Medicine

## 2019-02-12 DIAGNOSIS — D649 Anemia, unspecified: Secondary | ICD-10-CM

## 2019-02-12 DIAGNOSIS — R634 Abnormal weight loss: Secondary | ICD-10-CM

## 2019-02-23 ENCOUNTER — Other Ambulatory Visit: Payer: Self-pay

## 2019-02-23 ENCOUNTER — Encounter: Payer: Self-pay | Admitting: Radiology

## 2019-02-23 ENCOUNTER — Ambulatory Visit
Admission: RE | Admit: 2019-02-23 | Discharge: 2019-02-23 | Disposition: A | Discharge status: Home / Self Care | Payer: Medicare Other | Source: Ambulatory Visit | Attending: Family Medicine | Admitting: Family Medicine

## 2019-02-23 DIAGNOSIS — D649 Anemia, unspecified: Secondary | ICD-10-CM

## 2019-02-23 DIAGNOSIS — R634 Abnormal weight loss: Secondary | ICD-10-CM

## 2019-02-23 MED ORDER — IOPAMIDOL (ISOVUE-300) INJECTION 61%
100.0000 mL | Freq: Once | INTRAVENOUS | Status: AC | PRN
  Administered 2019-02-23: 80 mL via INTRAVENOUS

## 2019-03-24 ENCOUNTER — Encounter: Payer: Self-pay | Admitting: Diagnostic Neuroimaging

## 2019-03-24 ENCOUNTER — Telehealth: Payer: Self-pay | Admitting: *Deleted

## 2019-03-24 ENCOUNTER — Encounter

## 2019-03-24 ENCOUNTER — Ambulatory Visit: Payer: Medicare Other | Admitting: Diagnostic Neuroimaging

## 2019-03-24 NOTE — Telephone Encounter (Signed)
Patient was no show for follow up today. 

## 2019-06-22 ENCOUNTER — Ambulatory Visit: Payer: Self-pay | Admitting: Diagnostic Neuroimaging

## 2019-07-20 ENCOUNTER — Ambulatory Visit: Payer: Self-pay | Admitting: Diagnostic Neuroimaging

## 2019-07-30 ENCOUNTER — Encounter: Payer: Medicare Other | Admitting: Family Medicine

## 2019-08-12 ENCOUNTER — Ambulatory Visit: Payer: Medicare Other | Attending: Internal Medicine

## 2019-08-12 DIAGNOSIS — Z23 Encounter for immunization: Secondary | ICD-10-CM | POA: Insufficient documentation

## 2019-08-12 NOTE — Progress Notes (Signed)
   Covid-19 Vaccination Clinic  Name:  Allison Chavez    MRN: [id number] DOB: May 18, 1941  08/12/2019  Allison Chavez was observed post Covid-19 immunization for 15 minutes without incidence. She was provided with Vaccine Information Sheet and instruction to access the V-Safe system.   Allison Chavez was instructed to call 911 with any severe reactions post vaccine: Marland Kitchen Difficulty breathing  . Swelling of your face and throat  . A fast heartbeat  . A bad rash all over your body  . Dizziness and weakness    Immunizations Administered    Name Date Dose VIS Date Route   Pfizer COVID-19 Vaccine 08/12/2019 10:36 AM 0.3 mL 07/10/2019 Intramuscular   Manufacturer: [employer], [employer]   Lot: [id number]   [place]: [id number]

## 2019-08-21 ENCOUNTER — Encounter: Payer: Medicare Other | Admitting: Family Medicine

## 2019-09-01 ENCOUNTER — Ambulatory Visit: Payer: Medicare Other | Attending: Internal Medicine

## 2019-09-01 DIAGNOSIS — Z23 Encounter for immunization: Secondary | ICD-10-CM | POA: Insufficient documentation

## 2019-09-01 NOTE — Progress Notes (Signed)
   Covid-19 Vaccination Clinic  Name:  Allison Chavez    MRN: [id number] DOB: Nov 01, 1940  09/01/2019  [name] was observed post Covid-19 immunization for 15 minutes without incidence. She was provided with Vaccine Information Sheet and instruction to access the V-Safe system.   [name] was instructed to call 911 with any severe reactions post vaccine: Marland Kitchen Difficulty breathing  . Swelling of your face and throat  . A fast heartbeat  . A bad rash all over your body  . Dizziness and weakness    Immunizations Administered    Name Date Dose VIS Date Route   Pfizer COVID-19 Vaccine 09/01/2019 10:09 AM 0.3 mL 07/10/2019 Intramuscular   Manufacturer: [employer]   Lot: [id number]   [place]: [id number]

## 2019-09-04 ENCOUNTER — Ambulatory Visit (INDEPENDENT_AMBULATORY_CARE_PROVIDER_SITE_OTHER): Payer: Medicare Other | Admitting: Family Medicine

## 2019-09-04 ENCOUNTER — Encounter: Payer: Medicare Other | Admitting: Family Medicine

## 2019-09-04 ENCOUNTER — Other Ambulatory Visit: Payer: Self-pay

## 2019-09-04 ENCOUNTER — Encounter: Payer: Self-pay | Admitting: Family Medicine

## 2019-09-04 VITALS — BP 148/74 | HR 69 | Temp 98.1°F | Resp 16 | Ht 62.25 in | Wt 144.0 lb

## 2019-09-04 DIAGNOSIS — F418 Other specified anxiety disorders: Secondary | ICD-10-CM | POA: Diagnosis not present

## 2019-09-04 DIAGNOSIS — I10 Essential (primary) hypertension: Secondary | ICD-10-CM | POA: Diagnosis not present

## 2019-09-04 DIAGNOSIS — E039 Hypothyroidism, unspecified: Secondary | ICD-10-CM

## 2019-09-04 DIAGNOSIS — G4700 Insomnia, unspecified: Secondary | ICD-10-CM

## 2019-09-04 MED ORDER — CITALOPRAM HYDROBROMIDE 20 MG PO TABS: 20.0000 mg | ORAL_TABLET | Freq: Every day | ORAL | 3 refills | Status: DC

## 2019-09-04 MED ORDER — HYDROXYZINE HCL 25 MG PO TABS: 12.5000 mg | ORAL_TABLET | Freq: Every evening | ORAL | 2 refills | Status: DC | PRN

## 2019-09-04 NOTE — Patient Instructions (Addendum)
Restart citalopram once per day.  Okay to use hydroxyzine 1-2 at the most 1/2 pills at night to use lowest effective dose.  If sleep is not improving with this change, I would recommend meeting with a  sleep psychologist.  Follow-up sooner than 3 weeks if any worsening symptoms.  I will check some labs today but no change in medications at this time.  Thanks for coming in today.    Insomnia Insomnia is a sleep disorder that makes it difficult to fall asleep or stay asleep. Insomnia can cause fatigue, low energy, difficulty concentrating, mood swings, and poor performance at work or school. There are three different ways to classify insomnia:  Difficulty falling asleep.  Difficulty staying asleep.  Waking up too early in the morning. Any type of insomnia can be long-term (chronic) or short-term (acute). Both are common. Short-term insomnia usually lasts for three months or less. Chronic insomnia occurs at least three times a week for longer than three months. What are the causes? Insomnia may be caused by another condition, situation, or substance, such as:  Anxiety.  Certain medicines.  Gastroesophageal reflux disease (GERD) or other gastrointestinal conditions.  Asthma or other breathing conditions.  Restless legs syndrome, sleep apnea, or other sleep disorders.  Chronic pain.  Menopause.  Stroke.  Abuse of alcohol, tobacco, or illegal drugs.  Mental health conditions, such as depression.  Caffeine.  Neurological disorders, such as Alzheimer's disease.  An overactive thyroid (hyperthyroidism). Sometimes, the cause of insomnia may not be known. What increases the risk? Risk factors for insomnia include:  Gender. Women are affected more often than men.  Age. Insomnia is more common as you get older.  Stress.  Lack of exercise.  Irregular work schedule or working night shifts.  Traveling between different time zones.  Certain medical and mental health  conditions. What are the signs or symptoms? If you have insomnia, the main symptom is having trouble falling asleep or having trouble staying asleep. This may lead to other symptoms, such as:  Feeling fatigued or having low energy.  Feeling nervous about going to sleep.  Not feeling rested in the morning.  Having trouble concentrating.  Feeling irritable, anxious, or depressed. How is this diagnosed? This condition may be diagnosed based on:  Your symptoms and medical history. Your health care provider may ask about: ? Your sleep habits. ? Any medical conditions you have. ? Your mental health.  A physical exam. How is this treated? Treatment for insomnia depends on the cause. Treatment may focus on treating an underlying condition that is causing insomnia. Treatment may also include:  Medicines to help you sleep.  Counseling or therapy.  Lifestyle adjustments to help you sleep better. Follow these instructions at home: Eating and drinking   Limit or avoid alcohol, caffeinated beverages, and cigarettes, especially close to bedtime. These can disrupt your sleep.  Do not eat a large meal or eat spicy foods right before bedtime. This can lead to digestive discomfort that can make it hard for you to sleep. Sleep habits   Keep a sleep diary to help you and your health care provider figure out what could be causing your insomnia. Write down: ? When you sleep. ? When you wake up during the night. ? How well you sleep. ? How rested you feel the next day. ? Any side effects of medicines you are taking. ? What you eat and drink.  Make your bedroom a dark, comfortable place where it is easy to  fall asleep. ? Put up shades or blackout curtains to block light from outside. ? Use a white noise machine to block noise. ? Keep the temperature cool.  Limit screen use before bedtime. This includes: ? Watching TV. ? Using your smartphone, tablet, or computer.  Stick to a routine  that includes going to bed and waking up at the same times every day and night. This can help you fall asleep faster. Consider making a quiet activity, such as reading, part of your nighttime routine.  Try to avoid taking naps during the day so that you sleep better at night.  Get out of bed if you are still awake after 15 minutes of trying to sleep. Keep the lights down, but try reading or doing a quiet activity. When you feel sleepy, go back to bed. General instructions  Take over-the-counter and prescription medicines only as told by your health care provider.  Exercise regularly, as told by your health care provider. Avoid exercise starting several hours before bedtime.  Use relaxation techniques to manage stress. Ask your health care provider to suggest some techniques that may work well for you. These may include: ? Breathing exercises. ? Routines to release muscle tension. ? Visualizing peaceful scenes.  Make sure that you drive carefully. Avoid driving if you feel very sleepy.  Keep all follow-up visits as told by your health care provider. This is important. Contact a health care provider if:  You are tired throughout the day.  You have trouble in your daily routine due to sleepiness.  You continue to have sleep problems, or your sleep problems get worse. Get help right away if:  You have serious thoughts about hurting yourself or someone else. If you ever feel like you may hurt yourself or others, or have thoughts about taking your own life, get help right away. You can go to your nearest emergency department or call:  Your local emergency services (911 in the [place]  A suicide crisis helpline, such as the [facility] at 972 303 4504. This is open 24 hours a day. Summary  Insomnia is a sleep disorder that makes it difficult to fall asleep or stay asleep.  Insomnia can be long-term (chronic) or short-term (acute).  Treatment for insomnia  depends on the cause. Treatment may focus on treating an underlying condition that is causing insomnia.  Keep a sleep diary to help you and your health care provider figure out what could be causing your insomnia. This information is not intended to replace advice given to you by your health care provider. Make sure you discuss any questions you have with your health care provider. Document Revised: 06/28/2017 Document Reviewed: 04/25/2017 Elsevier Patient Education  [employer].   If you have lab work done today you will be contacted with your lab results within the next 2 weeks.  If you have not heard from [place] then please contact us. The fastest way to get your results is to register for My Chart.   IF you received an x-ray today, you will receive an invoice from [facility]. Please contact [facility] at [phone] with questions or concerns regarding your invoice.   IF you received labwork today, you will receive an invoice from [facility]. Please contact [facility] at [phone] with questions or concerns regarding your invoice.   Our billing staff will not be able to assist you with questions regarding bills from these companies.  You will be contacted with the lab results as soon as  they are available. The fastest way to get your results is to activate your My Chart account. Instructions are located on the last page of this paperwork. If you have not heard from [place] regarding the results in 2 weeks, please contact this office.
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
## 2019-09-05 ENCOUNTER — Encounter: Payer: Self-pay | Admitting: Family Medicine

## 2019-09-05 LAB — BASIC METABOLIC PANEL
BUN/Creatinine Ratio: 23 (ref 12–28)
BUN: 28 mg/dL — ABNORMAL HIGH (ref 8–27)
CO2: 21 mmol/L (ref 20–29)
Calcium: 9 mg/dL (ref 8.7–10.3)
Chloride: 105 mmol/L (ref 96–106)
Creatinine, Ser: 1.22 mg/dL — ABNORMAL HIGH (ref 0.57–1.00)
GFR calc Af Amer: 50 mL/min/{1.73_m2} — ABNORMAL LOW (ref >59)
GFR calc non Af Amer: 43 mL/min/{1.73_m2} — ABNORMAL LOW (ref >59)
Glucose: 99 mg/dL (ref 65–99)
Potassium: 4.7 mmol/L (ref 3.5–5.2)
Sodium: 139 mmol/L (ref 134–144)

## 2019-09-05 LAB — TSH: TSH: 6.82 u[IU]/mL — ABNORMAL HIGH (ref 0.450–4.500)

## 2019-09-07 ENCOUNTER — Telehealth: Payer: Self-pay | Admitting: Family Medicine

## 2019-09-07 DIAGNOSIS — G4700 Insomnia, unspecified: Secondary | ICD-10-CM

## 2019-09-07 NOTE — Telephone Encounter (Signed)
Dr.Greene patient was informed to continue on same dose of bp meds for now. But Patient concern was for the hydroxyzine. Patient stated she was already taking 1 tab of the hydroxyzine and it was not helping at office visit you told her she can take 1 1/2 along with other meds. But your note states for patient to  "Take 0.5-1 tablets (12.5-25 mg total) by mouth at bedtime as needed for anxiety"

## 2019-09-07 NOTE — Telephone Encounter (Signed)
Pt called about dose or med change for her BP/ please advise

## 2019-09-08 NOTE — Telephone Encounter (Signed)
I did tell her that she can take up to 1 and 1/2 if needed but cautiously.  Usual dose would be 1/2 to 1 tablet.  Thanks.

## 2019-09-08 NOTE — Telephone Encounter (Signed)
Patient was informed.

## 2019-09-09 MED ORDER — HYDROXYZINE HCL 25 MG PO TABS: 12.5000 mg | ORAL_TABLET | Freq: Every evening | ORAL | 2 refills | Status: DC | PRN

## 2019-09-09 NOTE — Telephone Encounter (Signed)
Pt pharmacy called for Pt asking for the new instructions on the prescription of hydroxyzine to be sent in. Please advise

## 2019-09-09 NOTE — Telephone Encounter (Signed)
Patient wants new instruction on rx sent into the pharmacy Hydroxyzine 12.5-25 mg 1 1/2 tab if needed by mouth at bedtime as needed for anxiety.

## 2019-09-09 NOTE — Telephone Encounter (Signed)
Sent!

## 2019-09-28 ENCOUNTER — Ambulatory Visit (INDEPENDENT_AMBULATORY_CARE_PROVIDER_SITE_OTHER): Payer: Medicare Other | Admitting: Family Medicine

## 2019-09-28 ENCOUNTER — Other Ambulatory Visit: Payer: Self-pay

## 2019-09-28 ENCOUNTER — Encounter: Payer: Self-pay | Admitting: Family Medicine

## 2019-09-28 VITALS — BP 126/78 | HR 72 | Temp 98.1°F | Ht 62.25 in | Wt 140.2 lb

## 2019-09-28 DIAGNOSIS — F5104 Psychophysiologic insomnia: Secondary | ICD-10-CM

## 2019-09-28 DIAGNOSIS — N1832 Chronic kidney disease, stage 3b: Secondary | ICD-10-CM

## 2019-09-28 DIAGNOSIS — F418 Other specified anxiety disorders: Secondary | ICD-10-CM | POA: Diagnosis not present

## 2019-09-28 DIAGNOSIS — R7989 Other specified abnormal findings of blood chemistry: Secondary | ICD-10-CM

## 2019-09-28 MED ORDER — CITALOPRAM HYDROBROMIDE 20 MG PO TABS: 20.0000 mg | ORAL_TABLET | Freq: Every day | ORAL | 1 refills | Status: DC
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
## 2019-09-28 NOTE — Patient Instructions (Addendum)
  I'm glad to hear sleep is better. Ok to continue celexa same dose, and low dose hydroxyzine only if needed (1/2 pill).  Recheck celexa in 3 months.  Try to avoid nsaids if possible and stay hydrated. Water is best. Will recheck kidney in 3 months.    If you have lab work done today you will be contacted with your lab results within the next 2 weeks.  If you have not heard from [place] then please contact us. The fastest way to get your results is to register for My Chart.   IF you received an x-ray today, you will receive an invoice from [facility] Radiology. Please contact [facility] Radiology at [phone] with questions or concerns regarding your invoice.   IF you received labwork today, you will receive an invoice from [place]. Please contact [facility] at [phone] with questions or concerns regarding your invoice.   Our billing staff will not be able to assist you with questions regarding bills from these companies.  You will be contacted with the lab results as soon as they are available. The fastest way to get your results is to activate your My Chart account. Instructions are located on the last page of this paperwork. If you have not heard from [place] regarding the results in 2 weeks, please contact this office.

## 2019-10-01 LAB — TSH+FREE T4
Free T4: 1.01 ng/dL (ref 0.82–1.77)
TSH: 6.56 u[IU]/mL — ABNORMAL HIGH (ref 0.450–4.500)

## 2019-10-01 LAB — MAGNESIUM: Magnesium: 2.4 mg/dL — ABNORMAL HIGH (ref 1.6–2.3)

## 2019-12-08 ENCOUNTER — Encounter (INDEPENDENT_AMBULATORY_CARE_PROVIDER_SITE_OTHER): Payer: Self-pay | Admitting: Ophthalmology

## 2019-12-08 ENCOUNTER — Other Ambulatory Visit: Payer: Self-pay

## 2019-12-08 ENCOUNTER — Ambulatory Visit (INDEPENDENT_AMBULATORY_CARE_PROVIDER_SITE_OTHER): Payer: Medicare Other | Admitting: Ophthalmology

## 2019-12-08 DIAGNOSIS — H353132 Nonexudative age-related macular degeneration, bilateral, intermediate dry stage: Secondary | ICD-10-CM

## 2019-12-08 DIAGNOSIS — H4302 Vitreous prolapse, left eye: Secondary | ICD-10-CM

## 2019-12-08 DIAGNOSIS — H35362 Drusen (degenerative) of macula, left eye: Secondary | ICD-10-CM

## 2019-12-08 DIAGNOSIS — H35361 Drusen (degenerative) of macula, right eye: Secondary | ICD-10-CM

## 2019-12-08 DIAGNOSIS — Z961 Presence of intraocular lens: Secondary | ICD-10-CM

## 2019-12-08 HISTORY — DX: Drusen (degenerative) of macula, left eye: H35.362

## 2019-12-08 HISTORY — DX: Drusen (degenerative) of macula, right eye: H35.361

## 2019-12-08 HISTORY — DX: Presence of intraocular lens: Z96.1

## 2019-12-08 HISTORY — DX: Vitreous prolapse, left eye: H43.02

## 2019-12-08 HISTORY — DX: Nonexudative age-related macular degeneration, bilateral, intermediate dry stage: H35.3132

## 2019-12-08 NOTE — Assessment & Plan Note (Signed)
This condition OS has been monitored for some years.  There is no visual impact at this time.  There is no corneal touch.  There is no corneal opacification nor focal edema.

## 2019-12-08 NOTE — Assessment & Plan Note (Signed)
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
## 2019-12-30 ENCOUNTER — Encounter: Payer: Self-pay | Admitting: Family Medicine

## 2019-12-30 ENCOUNTER — Ambulatory Visit (INDEPENDENT_AMBULATORY_CARE_PROVIDER_SITE_OTHER): Payer: Medicare Other | Admitting: Family Medicine

## 2019-12-30 ENCOUNTER — Other Ambulatory Visit: Payer: Self-pay

## 2019-12-30 VITALS — BP 122/81 | HR 94 | Temp 98.0°F | Ht 62.0 in | Wt 139.0 lb

## 2019-12-30 DIAGNOSIS — F5104 Psychophysiologic insomnia: Secondary | ICD-10-CM | POA: Diagnosis not present

## 2019-12-30 DIAGNOSIS — L853 Xerosis cutis: Secondary | ICD-10-CM | POA: Diagnosis not present

## 2019-12-30 DIAGNOSIS — F418 Other specified anxiety disorders: Secondary | ICD-10-CM | POA: Diagnosis not present

## 2019-12-30 DIAGNOSIS — E039 Hypothyroidism, unspecified: Secondary | ICD-10-CM

## 2019-12-30 DIAGNOSIS — N1832 Chronic kidney disease, stage 3b: Secondary | ICD-10-CM

## 2019-12-30 MED ORDER — HYDROXYZINE HCL 25 MG PO TABS: 12.5000 mg | ORAL_TABLET | Freq: Every evening | ORAL | 1 refills | Status: DC | PRN

## 2019-12-30 NOTE — Patient Instructions (Addendum)
Drink at least 64 ounces of water daily. Consider a humidifier for the room where you sleep. Bathe once daily. Avoid using HOT water, as it dries skin.  Avoid deodorant soaps (Dial is the worst!) and stick with gentle cleansers (I like Cetaphil Liquid Cleanser). After bathing, dry off completely, then apply a thick emollient cream (I like Cetaphil Moisturizing Cream). Apply the cream twice daily, or more!  I will check thyroid test again today. Same dose synthroid for now.   Continue celexa same dose, hydroxyzine 1/2 pill if needed for now for sleep (only use if needed- this may be causing some of the dry skin/eye symptoms).   Recheck in 3 months. Take care!   If you have lab work done today you will be contacted with your lab results within the next 2 weeks.  If you have not heard from [place] then please contact us. The fastest way to get your results is to register for My Chart.   IF you received an x-ray today, you will receive an invoice from [facility] Radiology. Please contact [facility] Radiology at [phone] with questions or concerns regarding your invoice.   IF you received labwork today, you will receive an invoice from [place]. Please contact [facility] at [phone] with questions or concerns regarding your invoice.   Our billing staff will not be able to assist you with questions regarding bills from these companies.  You will be contacted with the lab results as soon as they are available. The fastest way to get your results is to activate your My Chart account. Instructions are located on the last page of this paperwork. If you have not heard from [place] regarding the results in 2 weeks, please contact this office.
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
## 2019-12-31 LAB — TSH+FREE T4
Free T4: 1.06 ng/dL (ref 0.82–1.77)
TSH: 8.15 u[IU]/mL — ABNORMAL HIGH (ref 0.450–4.500)

## 2020-01-09 IMAGING — CT CT CERVICAL SPINE W/O CM
4 of 7 series · 13 of 33 positions shown, 15 images · non-contrast
Comparison: No priors.

CLINICAL DATA: 76-year-old female with history of injury from a
fall while walking on the street with head trauma.

EXAM:
CT HEAD WITHOUT CONTRAST
CT CERVICAL SPINE WITHOUT CONTRAST
TECHNIQUE: Multidetector CT imaging of the head and cervical spine was
performed following the standard protocol without intravenous
contrast. Multiplanar CT image reconstructions of the cervical spine
were also generated.

[Series 8: c spine soft · axial · 0.32mm/px · z∈[-382,-298]mm · 3 of 84 slices shown]
[im 21/84  soft-tissue]
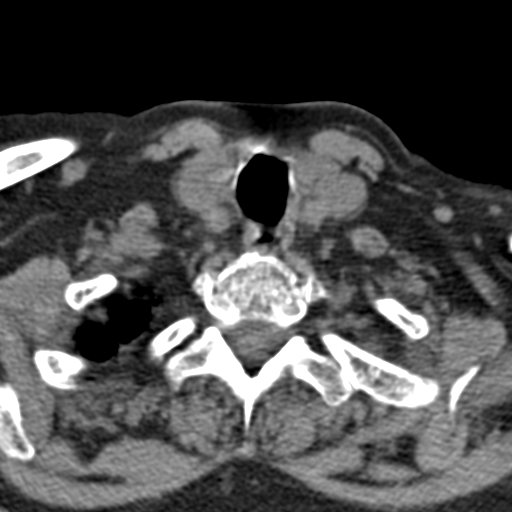
[im 42/84  soft-tissue]
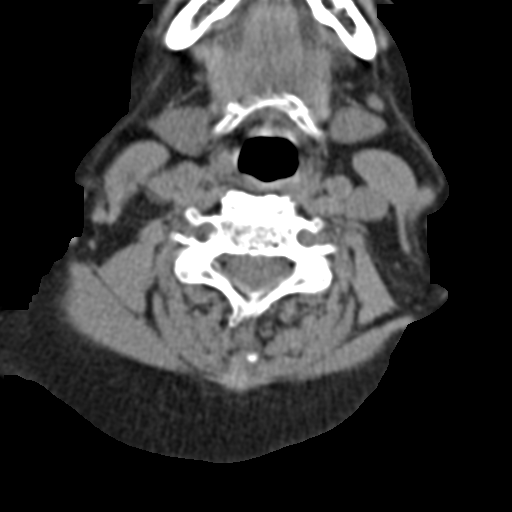
[im 63/84  soft-tissue]
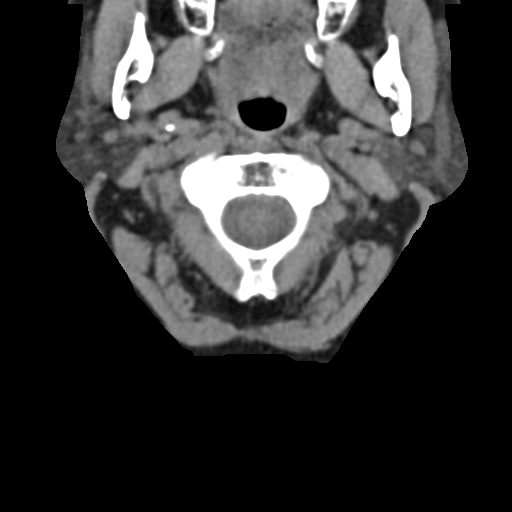

[Series 9: orthogonal bone · axial · 0.23mm/px · z∈[-431,-311]mm · 5 of 102 slices shown, 7 images]
[im 17/102  soft-tissue]
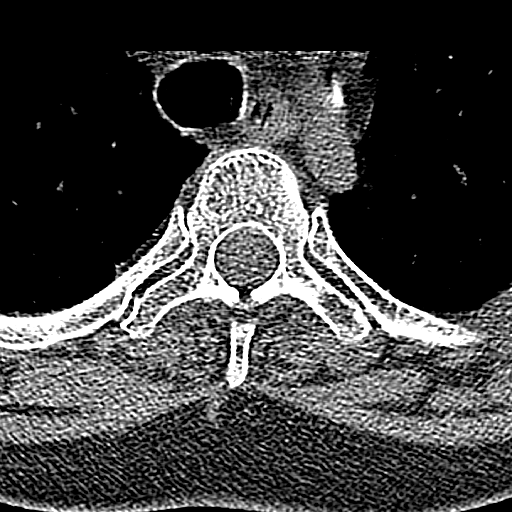
[im 17/102  bone]
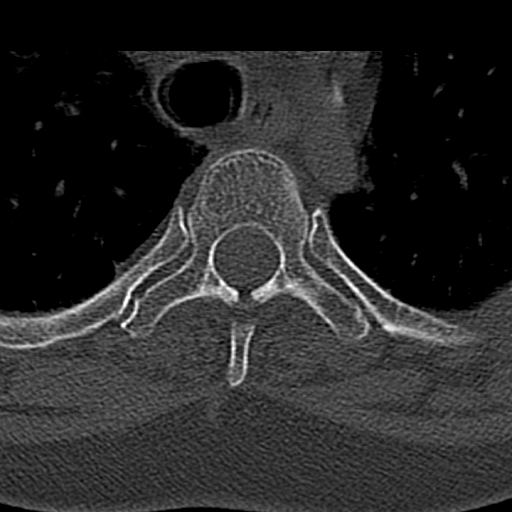
[im 34/102  bone]
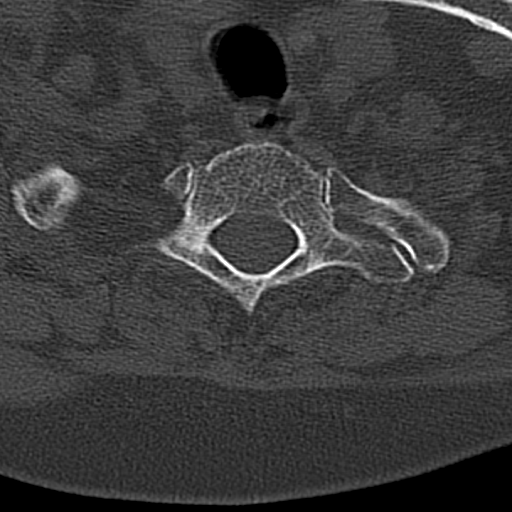
[im 51/102  bone]
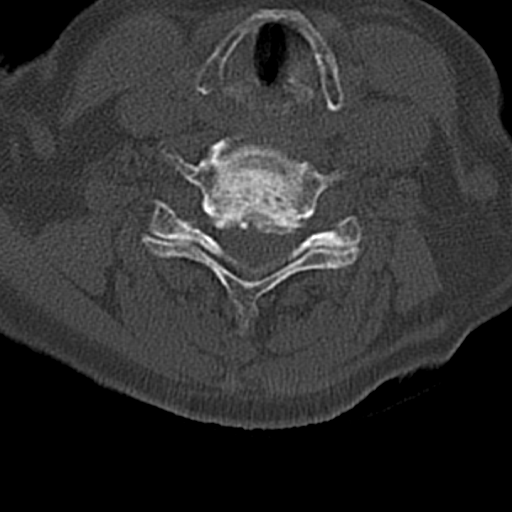
[im 68/102  bone]
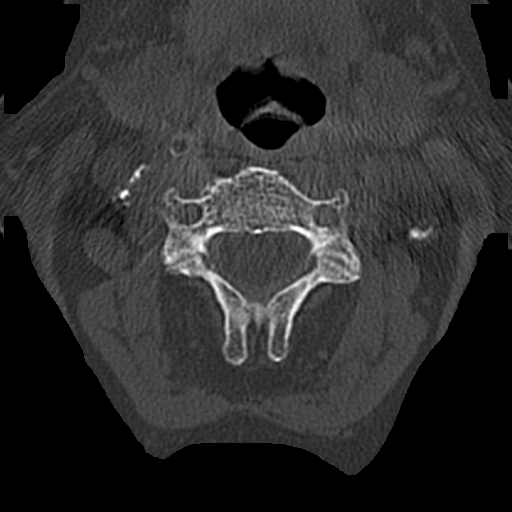
[im 85/102  soft-tissue]
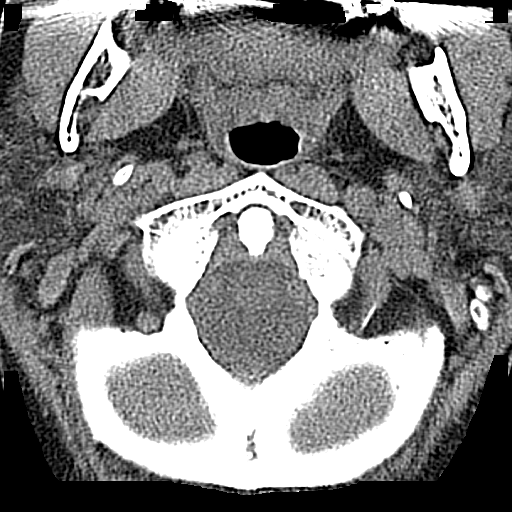
[im 85/102  bone]
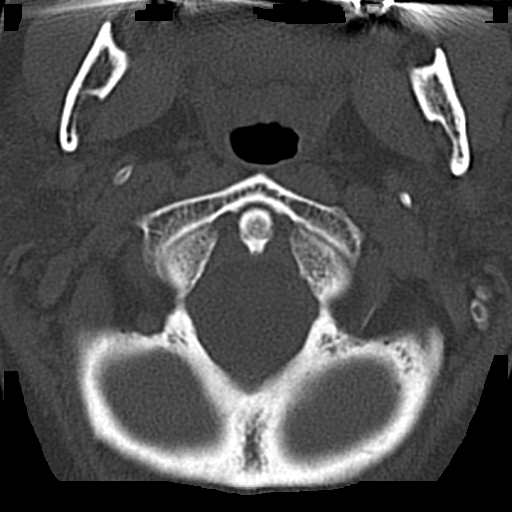

[Series 10: coronal bone · coronal · 0.24mm/px · 1 of 61 slices shown]
[im 31/61  bone]
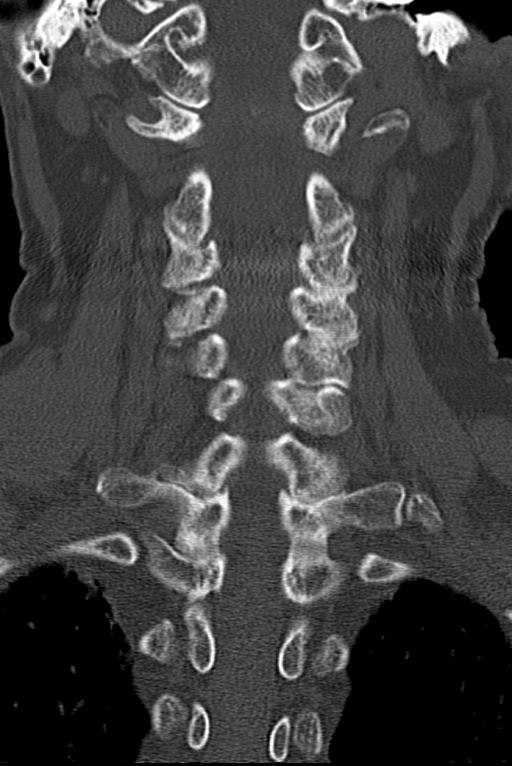

[Series 11: sagittal bone · sagittal · 0.32mm/px · 4 of 61 slices shown]
[im 13/61  bone]
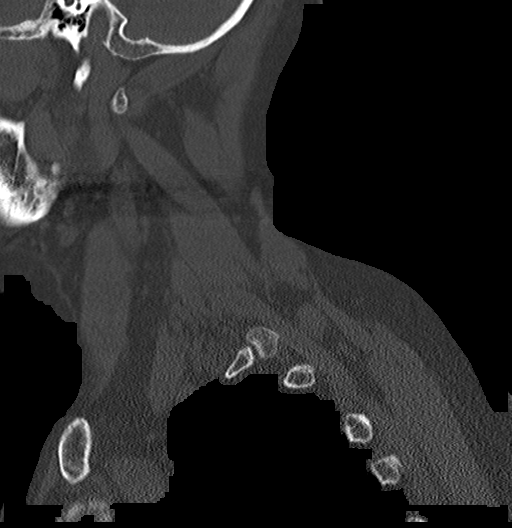
[im 25/61  bone]
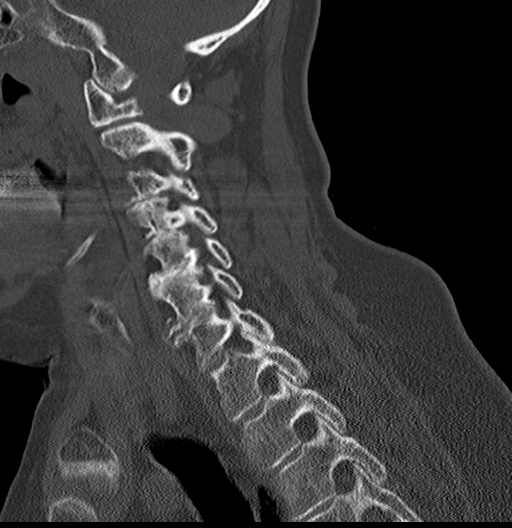
[im 37/61  bone]
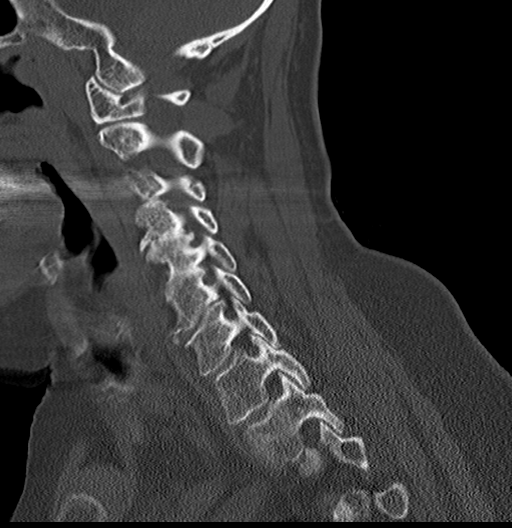
[im 49/61  bone]
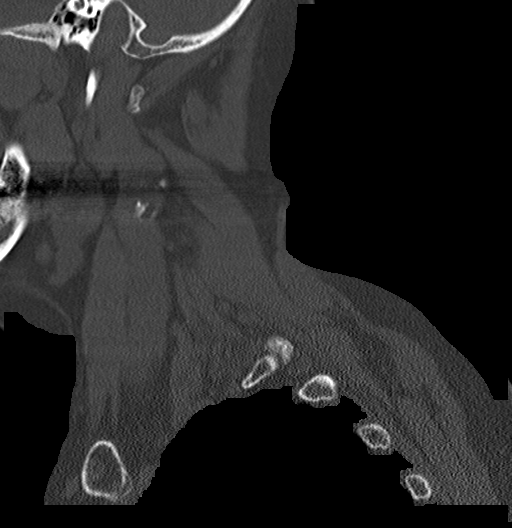

[13 of 33 positions shown; findings below may reference images not displayed]

FINDINGS: CT HEAD FINDINGS

Brain: Patchy and confluent areas of decreased attenuation are noted
throughout the deep and periventricular white matter of the cerebral
hemispheres bilaterally, compatible with chronic microvascular
ischemic disease. Well-defined focus of low attenuation in the right
basal ganglia, compatible with an old lacunar infarct. No evidence
of acute infarction, hemorrhage, hydrocephalus, extra-axial
collection or mass lesion/mass effect.

Vascular: No hyperdense vessel or unexpected calcification.

Skull: Normal. Negative for fracture or focal lesion.

Sinuses/Orbits: No acute finding.

Other: None.

CT CERVICAL SPINE FINDINGS

Alignment: Normal.

Skull base and vertebrae: No acute fracture. No primary bone lesion
or focal pathologic process.

Soft tissues and spinal canal: No prevertebral fluid or swelling. No
visible canal hematoma.

Disc levels: Multilevel degenerative disc disease, most severe at
C4-C5, C5-C6 and C6-C7. Mild multilevel facet arthropathy.

Upper chest: Unremarkable.

Other: Unremarkable.
IMPRESSION: 1. No evidence of significant acute traumatic injury to the skull,
brain or cervical spine.
2. Advanced microvascular ischemic changes in the cerebral white
matter with old lacunar infarct in the right basal ganglia.
3. Multilevel degenerative disc disease and cervical spondylosis, as
above.

## 2020-03-30 ENCOUNTER — Encounter: Payer: Self-pay | Admitting: Family Medicine

## 2020-03-30 ENCOUNTER — Other Ambulatory Visit: Payer: Self-pay

## 2020-03-30 ENCOUNTER — Ambulatory Visit (INDEPENDENT_AMBULATORY_CARE_PROVIDER_SITE_OTHER): Payer: Medicare Other | Admitting: Family Medicine

## 2020-03-30 VITALS — BP 138/82 | HR 71 | Temp 98.0°F | Ht 62.0 in | Wt 138.0 lb

## 2020-03-30 DIAGNOSIS — Z23 Encounter for immunization: Secondary | ICD-10-CM

## 2020-03-30 DIAGNOSIS — F5104 Psychophysiologic insomnia: Secondary | ICD-10-CM | POA: Diagnosis not present

## 2020-03-30 DIAGNOSIS — N1832 Chronic kidney disease, stage 3b: Secondary | ICD-10-CM | POA: Diagnosis not present

## 2020-03-30 DIAGNOSIS — R7989 Other specified abnormal findings of blood chemistry: Secondary | ICD-10-CM

## 2020-03-30 DIAGNOSIS — F418 Other specified anxiety disorders: Secondary | ICD-10-CM

## 2020-03-30 MED ORDER — HYDROXYZINE HCL 25 MG PO TABS: 12.5000 mg | ORAL_TABLET | Freq: Every evening | ORAL | 1 refills | Status: DC | PRN

## 2020-03-30 MED ORDER — CITALOPRAM HYDROBROMIDE 20 MG PO TABS: 20.0000 mg | ORAL_TABLET | Freq: Every day | ORAL | 1 refills | Status: DC
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
## 2020-03-30 NOTE — Patient Instructions (Addendum)
  Depending on labs, we can discuss if any new thyroid medication needed.  No other med changes at this time.  Follow-up in 6 months but let me know if there are questions sooner.      If you have lab work done today you will be contacted with your lab results within the next 2 weeks.  If you have not heard from [place] then please contact us. The fastest way to get your results is to register for My Chart.   IF you received an x-ray today, you will receive an invoice from [facility]. Please contact [facility] at [phone] with questions or concerns regarding your invoice.   IF you received labwork today, you will receive an invoice from [place]. Please contact [facility] at [phone] with questions or concerns regarding your invoice.   Our billing staff will not be able to assist you with questions regarding bills from these companies.  You will be contacted with the lab results as soon as they are available. The fastest way to get your results is to activate your My Chart account. Instructions are located on the last page of this paperwork. If you have not heard from [place] regarding the results in 2 weeks, please contact this office.

## 2020-03-31 LAB — BASIC METABOLIC PANEL
BUN/Creatinine Ratio: 22 (ref 12–28)
BUN: 27 mg/dL (ref 8–27)
CO2: 24 mmol/L (ref 20–29)
Calcium: 9.1 mg/dL (ref 8.7–10.3)
Chloride: 104 mmol/L (ref 96–106)
Creatinine, Ser: 1.23 mg/dL — ABNORMAL HIGH (ref 0.57–1.00)
GFR calc Af Amer: 49 mL/min/{1.73_m2} — ABNORMAL LOW (ref >59)
GFR calc non Af Amer: 42 mL/min/{1.73_m2} — ABNORMAL LOW (ref >59)
Glucose: 92 mg/dL (ref 65–99)
Potassium: 4.8 mmol/L (ref 3.5–5.2)
Sodium: 141 mmol/L (ref 134–144)

## 2020-03-31 LAB — TSH+FREE T4
Free T4: 1.07 ng/dL (ref 0.82–1.77)
TSH: 7.05 u[IU]/mL — ABNORMAL HIGH (ref 0.450–4.500)

## 2020-06-07 ENCOUNTER — Encounter (INDEPENDENT_AMBULATORY_CARE_PROVIDER_SITE_OTHER): Payer: Self-pay | Admitting: Ophthalmology

## 2020-06-07 ENCOUNTER — Other Ambulatory Visit: Payer: Self-pay

## 2020-06-07 ENCOUNTER — Ambulatory Visit (INDEPENDENT_AMBULATORY_CARE_PROVIDER_SITE_OTHER): Payer: Medicare Other | Admitting: Ophthalmology

## 2020-06-07 DIAGNOSIS — H02032 Senile entropion of right lower eyelid: Secondary | ICD-10-CM

## 2020-06-07 DIAGNOSIS — H353132 Nonexudative age-related macular degeneration, bilateral, intermediate dry stage: Secondary | ICD-10-CM | POA: Diagnosis not present

## 2020-06-07 HISTORY — DX: Senile entropion of right lower eyelid: H02.032

## 2020-06-07 NOTE — Assessment & Plan Note (Signed)
No signs of active CNVM 
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
## 2020-06-07 NOTE — Assessment & Plan Note (Signed)
Follow-up with Dr. Nathanial Rancher for involutional entropion right eye with eyelash abrasion to the ocular surface

## 2020-09-27 ENCOUNTER — Telehealth: Payer: Self-pay | Admitting: Family Medicine

## 2020-09-29 ENCOUNTER — Ambulatory Visit: Payer: Medicare Other | Admitting: Family Medicine

## 2020-10-14 ENCOUNTER — Telehealth (INDEPENDENT_AMBULATORY_CARE_PROVIDER_SITE_OTHER): Payer: Medicare Other | Admitting: Family Medicine

## 2020-10-14 ENCOUNTER — Other Ambulatory Visit: Payer: Self-pay

## 2020-10-14 DIAGNOSIS — F418 Other specified anxiety disorders: Secondary | ICD-10-CM

## 2020-10-14 DIAGNOSIS — M549 Dorsalgia, unspecified: Secondary | ICD-10-CM

## 2020-10-14 DIAGNOSIS — F5104 Psychophysiologic insomnia: Secondary | ICD-10-CM

## 2020-10-14 DIAGNOSIS — R7989 Other specified abnormal findings of blood chemistry: Secondary | ICD-10-CM | POA: Diagnosis not present

## 2020-10-14 DIAGNOSIS — N1832 Chronic kidney disease, stage 3b: Secondary | ICD-10-CM

## 2020-10-14 MED ORDER — HYDROXYZINE HCL 25 MG PO TABS: 12.5000 mg | ORAL_TABLET | Freq: Every evening | ORAL | 1 refills | Status: DC | PRN

## 2020-10-14 MED ORDER — CITALOPRAM HYDROBROMIDE 20 MG PO TABS: 20.0000 mg | ORAL_TABLET | Freq: Every day | ORAL | 2 refills | Status: DC
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
## 2020-10-14 NOTE — Patient Instructions (Addendum)
I'm glad to hear you are doing better, but if any persistent back pain or worsening I am happy to see you or other medical provider if needed.  Tylenol is fine, range of motion and walking is fine.  Continue Celexa same dose, I did refill hydroxyzine if needed for sleep, but that can be used as needed only.  I will check your thyroid test but previous levels were only borderline.  I will also check your kidney function test to make sure that is stable.  Continue to avoid medicines like ibuprofen or Aleve (Tylenol is okay) and drink plenty of fluids.  Good talking to you today, please let me know if there are questions.    Here are a few lab drawing stations for you to have your labwork performed:  [place], [place], [place] [address]  [address] [place], [place] [address]  Here is address for my new practice. [facility] Address: [address] [place], [place], [place] [address] Phone: [phone]   If you have lab work done today you will be contacted with your lab results within the next 2 weeks.  If you have not heard from [place] then please contact us. The fastest way to get your results is to register for My Chart.   IF you received an x-ray today, you will receive an invoice from [facility] Radiology. Please contact [facility] Radiology at [phone] with questions or concerns regarding your invoice.   IF you received labwork today, you will receive an invoice from [facility]. Please contact [facility] at [phone] with questions or concerns regarding your invoice.   Our billing staff will not be able to assist you with questions regarding bills from these companies.  You will be contacted with the lab results as soon as they are available. The fastest way to get your results is to activate your My Chart account. Instructions are located on the last page of this paperwork. If you have not heard from [place] regarding the results in 2 weeks,  please contact this office.

## 2020-10-18 ENCOUNTER — Telehealth: Payer: Self-pay | Admitting: Family Medicine

## 2020-10-18 NOTE — Telephone Encounter (Signed)
10/18/2020 - PATIENT HAD A TELEMED APPOINTMENT WITH DR. Carlota Raspberry ON Friday (10/14/2020). DR. Carlota Raspberry HAS REQUESTED SHE RETURN FOR A 6 MONTH MEDICATION REVIEW. I TRIED TO CALL AND BE SURE SHE WAS AWARE OF DR. GREENE'S NEW OFFICE AT [facility] IN [place] BUT I HAD TO LEAVE A VOICE MAIL. I LEFT THE INFORMATION AND TOLD HER TO CALL [place] BACK IF SHE HAS QUESTIONS. [facility]

## 2020-12-05 ENCOUNTER — Ambulatory Visit (INDEPENDENT_AMBULATORY_CARE_PROVIDER_SITE_OTHER): Payer: Medicare Other | Admitting: Ophthalmology

## 2020-12-05 ENCOUNTER — Encounter (INDEPENDENT_AMBULATORY_CARE_PROVIDER_SITE_OTHER): Payer: Self-pay | Admitting: Ophthalmology

## 2020-12-05 ENCOUNTER — Other Ambulatory Visit: Payer: Self-pay

## 2020-12-05 DIAGNOSIS — H4302 Vitreous prolapse, left eye: Secondary | ICD-10-CM | POA: Diagnosis not present

## 2020-12-05 DIAGNOSIS — H353132 Nonexudative age-related macular degeneration, bilateral, intermediate dry stage: Secondary | ICD-10-CM | POA: Diagnosis not present
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
## 2020-12-05 NOTE — Assessment & Plan Note (Signed)
Pigmentary deposition and vitreous prolapse in the visual axis in the anterior chamber yet no corneal touch and no visual symptoms.  Patient

## 2020-12-05 NOTE — Assessment & Plan Note (Addendum)
The nature of age--related macular degeneration was discussed with the patient as well as the distinction between dry and wet types. Checking an Amsler Grid daily with advice to return immediately should a distortion develop, was given to the patient. The patient 's smoking status now and in the past was determined and advice based on the AREDS study was provided regarding the consumption of antioxidant supplements. AREDS 2 vitamin formulation was recommended. Consumption of dark leafy vegetables and fresh fruits of various colors was recommended. Treatment modalities for wet macular degeneration particularly the use of intravitreal injections of anti-blood vessel growth factors was discussed with the patient. Avastin, Lucentis, and Eylea are the available options. On occasion, therapy includes the use of photodynamic therapy and thermal laser. Stressed to the patient do not rub eyes.  Patient was advised to check Amsler Grid daily and return immediately if changes are noted. Instructions on using the grid were given to the patient. All patient questions were answered.  No signs of CNVM OU 

## 2021-01-19 ENCOUNTER — Encounter (INDEPENDENT_AMBULATORY_CARE_PROVIDER_SITE_OTHER): Payer: Self-pay

## 2021-05-23 ENCOUNTER — Encounter (INDEPENDENT_AMBULATORY_CARE_PROVIDER_SITE_OTHER): Payer: Self-pay

## 2021-06-08 ENCOUNTER — Encounter (INDEPENDENT_AMBULATORY_CARE_PROVIDER_SITE_OTHER): Payer: Medicare Other | Admitting: Ophthalmology

## 2021-08-07 ENCOUNTER — Other Ambulatory Visit: Payer: Self-pay | Admitting: Family Medicine

## 2021-08-07 DIAGNOSIS — F418 Other specified anxiety disorders: Secondary | ICD-10-CM

## 2021-08-16 ENCOUNTER — Ambulatory Visit: Payer: Medicare Other | Admitting: Family Medicine

## 2021-08-21 ENCOUNTER — Telehealth: Payer: Self-pay | Admitting: Family Medicine

## 2021-08-21 NOTE — Telephone Encounter (Signed)
FYI   Caller states she would like to tell Dr. Nyoka Cowden that she is so so sorry she missed her appt., she was attending a funeral for a close friend of the community and wanted to let him know that she loves him and is sorry she missed the appt.

## 2021-08-21 NOTE — Telephone Encounter (Signed)
Pt called in stating that she is sleeping well without the sleeping medication.

## 2021-08-21 NOTE — Telephone Encounter (Signed)
No problem.  Appreciate her kind words.  Glad to hear things are going okay.  Can reschedule at her convenience in the next month or 2 if that works.  Thanks.

## 2021-09-07 NOTE — Telephone Encounter (Signed)
Erroneous encounter. Please disregard.

## 2021-10-02 ENCOUNTER — Ambulatory Visit (INDEPENDENT_AMBULATORY_CARE_PROVIDER_SITE_OTHER): Payer: Medicare Other | Admitting: Family Medicine

## 2021-10-02 ENCOUNTER — Encounter: Payer: Self-pay | Admitting: Family Medicine

## 2021-10-02 VITALS — BP 142/80 | HR 76 | Temp 97.8°F | Resp 16 | Ht 62.0 in | Wt 153.8 lb

## 2021-10-02 DIAGNOSIS — R030 Elevated blood-pressure reading, without diagnosis of hypertension: Secondary | ICD-10-CM

## 2021-10-02 DIAGNOSIS — F418 Other specified anxiety disorders: Secondary | ICD-10-CM

## 2021-10-02 DIAGNOSIS — R7989 Other specified abnormal findings of blood chemistry: Secondary | ICD-10-CM

## 2021-10-02 DIAGNOSIS — F5104 Psychophysiologic insomnia: Secondary | ICD-10-CM

## 2021-10-02 DIAGNOSIS — N1832 Chronic kidney disease, stage 3b: Secondary | ICD-10-CM

## 2021-10-02 LAB — BASIC METABOLIC PANEL
BUN: 26 mg/dL — ABNORMAL HIGH (ref 6–23)
CO2: 23 mEq/L (ref 19–32)
Calcium: 9.2 mg/dL (ref 8.4–10.5)
Chloride: 105 mEq/L (ref 96–112)
Creatinine, Ser: 1.31 mg/dL — ABNORMAL HIGH (ref 0.40–1.20)
GFR: 38.92 mL/min — ABNORMAL LOW (ref >60.00)
Glucose, Bld: 89 mg/dL (ref 70–99)
Potassium: 4.9 mEq/L (ref 3.5–5.1)
Sodium: 139 mEq/L (ref 135–145)

## 2021-10-02 MED ORDER — CITALOPRAM HYDROBROMIDE 20 MG PO TABS: 20.0000 mg | ORAL_TABLET | Freq: Every day | ORAL | 2 refills | Status: DC

## 2021-10-02 NOTE — Progress Notes (Signed)
Subjective:  Patient ID: Allison Chavez, female    DOB: [birth year]  Age: 81 y.o. MRN: [id number]  CC:  Chief Complaint  Patient presents with   Blood Pressure Check    163/95 over the weekend, no history of blood pressure issues prior, notes no headaches or lightheaded issues.    Anxiety    Pt in need of refill hydroxyzine and celexa notes has been out of both for some time now note no side effects     HPI Allison Chavez presents for   Elevated blood pressure: As above, 163/95 over the weekend.  No history of hypertension or current meds. Frustrated at the time. 177/94, 163/92.  BP Readings from Last 3 Encounters:  10/02/21 (!) 142/80  03/30/20 138/82  12/30/19 122/81    Hypothyroidism: Lab Results  Component Value Date   TSH 7.050 (H) 03/30/2020  History of hypothyroidism, previously on Synthroid 50 mcg but had normal free T4 for some time, including at 03/2020 visit. borderline TSH in September 2021.  No recent labs.   No new hot or cold intolerance. No new hair or skin changes, heart palpitations or new fatigue. No new weight changes.   CKD: Avoiding nsaids, tylenol only.  Drinking fluids, ginger ale, and V8. Some water.   Depression, anxiety, insomnia. Last discussed in March 2022. Celexa 20 mg daily with hydroxyzine as needed, update late last year that she was sleeping well without hydroxyzine. Ran out of celexa about 6 weeks ago. Working well on meds.  No more hydroxyzine needed - sleeping well.  Here with Allison Chavez today - lives with her.  Depression screen [facility] 2/9 10/02/2021 10/14/2020 03/30/2020 12/30/2019 09/28/2019  Decreased Interest 0 0 0 0 0  Down, Depressed, Hopeless 0 0 0 0 0  PHQ - 2 Score 0 0 0 0 0  Altered sleeping 0 - - - -  Tired, decreased energy 0 - - - -  Change in appetite 0 - - - -  Feeling bad or failure about yourself  1 - - - -  Trouble concentrating 0 - - - -  Moving slowly or fidgety/restless 0 - - - -  Suicidal thoughts 0 - - - -  PHQ-9 Score 1 - -  - -  Difficult doing work/chores - - - - -   GAD 7 : Generalized Anxiety Score 10/02/2021 11/20/2017  Nervous, Anxious, on Edge 1 0  Control/stop worrying 0 0  Worry too much - different things 0 0  Trouble relaxing 0 0  Restless 0 0  Easily annoyed or irritable 0 0  Afraid - awful might happen 1 0  Total GAD 7 Score 2 0  Anxiety Difficulty - Not difficult at all      History Patient Active Problem List   Diagnosis Date Noted   Senile entropion of right lower eyelid 06/07/2020   Intermediate stage nonexudative age-related macular degeneration of both eyes 12/08/2019   Vitreous prolapse of left eye 12/08/2019   Degenerative retinal drusen of left eye 12/08/2019   Degenerative retinal drusen of right eye 12/08/2019   Pseudophakia of both eyes 12/08/2019   CKD (chronic kidney disease), stage III ([place]) 12/25/2017   Hyponatremia 12/25/2017   Hypokalemia 12/25/2017   Alcohol abuse 12/25/2017   Sigmoid diverticulosis 01/15/2017   Macular degeneration 08/22/2015   Osteopenia 01/11/2015   Hyperlipidemia 01/11/2015   Insomnia 10/08/2013   HTN (hypertension) 03/07/2012   Past Medical History:  Diagnosis Date   Anxiety  Cataract    Depression    DJD (degenerative joint disease) of cervical spine    DJD of shoulder, right    Hypertension    Pneumonia    as a child   Renal insufficiency, mild    Past Surgical History:  Procedure Laterality Date   CLAVICLE SURGERY Right    from [facility]   COLONOSCOPY     EYE SURGERY Bilateral    cataract surgery with lens implants   HARDWARE REMOVAL Left 12/09/2014   Procedure: LEFT SHOULDER HARDWARE REMOVAL;  Surgeon: Justice Britain, MD;  Location: [place];  Service: Orthopedics;  Laterality: Left;   ORIF SHOULDER FRACTURE Left 09/23/2014   Procedure: OPEN REDUCTION INTERNAL FIXATION (ORIF) SHOULDER FRACTURE;  Surgeon: Marin Shutter, MD;  Location: [facility];  Service: Orthopedics;  Laterality: Left;   TONSILLECTOMY     adenoids- childhood    Allergies  Allergen Reactions   Ace Inhibitors Cough    Cough    Sulfa Antibiotics Hives   Wellbutrin [Bupropion] Rash    Only with generic, immediate release   Prior to Admission medications   Medication Sig Start Date End Date Taking? Authorizing Provider  citalopram (CELEXA) 20 MG tablet Take 1 tablet (20 mg total) by mouth daily. 10/14/20  Yes Wendie Agreste, MD  folic acid (FOLVITE) 1 MG tablet Take 1 tablet (1 mg total) by mouth daily. 12/28/17  Yes Florencia Reasons, MD  hydrOXYzine (ATARAX/VISTARIL) 25 MG tablet Take 0.5 tablets (12.5 mg total) by mouth at bedtime as needed for anxiety. 10/14/20  Yes Wendie Agreste, MD  Multiple Vitamins-Minerals (PRESERVISION AREDS 2+MULTI VIT) CAPS Take by mouth daily.   Yes [provider]  Multiple Vitamins-Minerals (WOMENS ONE DAILY) TABS Take 1 tablet by mouth 1 day or 1 dose.   Yes [provider]  thiamine 100 MG tablet Take 1 tablet (100 mg total) by mouth daily. 12/28/17  Yes Florencia Reasons, MD   Social History   Socioeconomic History   Marital status: Married    Spouse name: Francee Piccolo   Number of children: 3   Years of education: [employer] education level: Bachelor's degree (e.g., BA, AB, BS)  Occupational History   Occupation: English as a second language teacher  Tobacco Use   Smoking status: Former    Packs/day: 0.30    Years: 45.00    Pack years: 13.50    Types: Cigarettes    Quit date: 11/29/2001    Years since quitting: 19.8   Smokeless tobacco: Never   Tobacco comments:    quit age 70  Vaping Use   Vaping Use: Never used  Substance and Sexual Activity   Alcohol use: Yes    Comment: special occasions   Drug use: No   Sexual activity: Never    Partners: Male    Birth control/protection: Post-menopausal  Other Topics Concern   Not on file  Social History Narrative   Has not lived with husband (3rd marriage), Francee Piccolo, for many years, though they maintain an intermittently sexual relationship. They talk almost daily.  He lives  with his female partner.    Exercise- walks every day, for about a mile on average   Diet- Makes healthy choices and drinks a lot of water.    Sleep- 8 hours a night   Caffeine- coffee, 1/2 cup daily   Social Determinants of Health   Financial Resource Strain: Not on file  Food Insecurity: Not on file  Transportation Needs: Not on file  Physical Activity:  Not on file  Stress: Not on file  Social Connections: Not on file  Intimate Partner Violence: Not on file    Review of Systems  Constitutional:  Negative for fatigue and unexpected weight change.  Respiratory:  Negative for chest tightness and shortness of breath.   Cardiovascular:  Negative for chest pain, palpitations and leg swelling.  Gastrointestinal:  Negative for abdominal pain and blood in stool.  Neurological:  Negative for dizziness, syncope, light-headedness and headaches.    Objective:   Vitals:   10/02/21 1101 10/02/21 1111  BP: (!) 148/80 (!) 142/80  Pulse: 76   Resp: 16   Temp: 97.8 F (36.6 C)   TempSrc: Temporal   SpO2: 98%   Weight: 153 lb 12.8 oz (69.8 kg)   Height: '5\' 2"'$  (1.575 m)      Physical Exam Vitals reviewed.  Constitutional:      Appearance: Normal appearance. She is well-developed.  HENT:     Head: Normocephalic and atraumatic.  Eyes:     Conjunctiva/sclera: Conjunctivae normal.     Pupils: Pupils are equal, round, and reactive to light.  Neck:     Vascular: No carotid bruit.     Comments: No thyromegaly/nodule.  Cardiovascular:     Rate and Rhythm: Normal rate and regular rhythm.     Heart sounds: Normal heart sounds.  Pulmonary:     Effort: Pulmonary effort is normal.     Breath sounds: Normal breath sounds.  Abdominal:     Palpations: Abdomen is soft. There is no pulsatile mass.     Tenderness: There is no abdominal tenderness.  Musculoskeletal:     Right lower leg: No edema.     Left lower leg: No edema.  Skin:    General: Skin is warm and dry.  Neurological:      Mental Status: She is alert and oriented to person, place, and time.  Psychiatric:        Mood and Affect: Mood normal.        Behavior: Behavior normal.       Assessment & Plan:  TRINETTA ALEMU is a 81 y.o. female . Depression with anxiety - Plan: citalopram (CELEXA) 20 MG tablet Psychophysiological insomnia  -Stable on meds previously, restart Celexa same dose.  Denies need for meds for insomnia at this time.  Hold on further hydroxyzine doses with RTC precautions given.  Elevated TSH - Plan: TSH + free T4  -Mild elevated TSH with normal free T4 previously.  Denies hypothyroid symptoms.  Repeat labs to decide on potential need for meds.  Stage 3b chronic kidney disease ([place]) - Plan: Basic metabolic panel  -Avoid nephrotoxins, maintenance of hydration discussed.  Check BMP, repeat check in 3 months  Elevated blood pressure reading without diagnosis of hypertension - Plan: Basic metabolic panel  -Sodium impact on blood pressure discussed, avoid high sodium foods, possibly high sodium and V8 may be contributing.  Home monitoring discussed with recheck in the next 1 month, readings by MyChart next few weeks.  Asymptomatic at this time.  Hold on meds for now.  Meds ordered this encounter  Medications   citalopram (CELEXA) 20 MG tablet    Sig: Take 1 tablet (20 mg total) by mouth daily.    Dispense:  90 tablet    Refill:  2   Patient Instructions  Cut back on sodium - watch sodium on food label, try to avoid V8, or low sodium version.  See handout on common high  salt foods. Keep a record of your blood pressures outside of the office and bring them to the next office visit in 1 month. Send me an update by mychart in next few weeks.  Restart celexa daily.   Return to the clinic or go to the nearest emergency room if any of your symptoms worsen or new symptoms occur.       Signed,   [name], MD [facility], [place]  [facility] 10/02/21 11:50 AM

## 2021-10-02 NOTE — Patient Instructions (Addendum)
Cut back on sodium - watch sodium on food label, try to avoid V8, or low sodium version.  ?See handout on common high salt foods. Keep a record of your blood pressures outside of the office and bring them to the next office visit in 1 month. Send me an update by mychart in next few weeks.  ?Restart celexa daily.  ? ?Return to the clinic or go to the nearest emergency room if any of your symptoms worsen or new symptoms occur. ? ? ? ?

## 2021-10-03 LAB — TSH+FREE T4: TSH W/REFLEX TO FT4: 7.2 mIU/L — ABNORMAL HIGH (ref 0.40–4.50)

## 2021-10-03 LAB — T4, FREE: Free T4: 1 ng/dL (ref 0.8–1.8)

## 2021-10-10 ENCOUNTER — Other Ambulatory Visit: Payer: Self-pay | Admitting: Family Medicine

## 2021-10-10 DIAGNOSIS — N1832 Chronic kidney disease, stage 3b: Secondary | ICD-10-CM

## 2021-10-10 NOTE — Progress Notes (Signed)
Bmp for elevated creatinine with CKD.  ?

## 2021-11-02 ENCOUNTER — Ambulatory Visit: Payer: Medicare Other | Admitting: Family Medicine

## 2021-11-14 ENCOUNTER — Ambulatory Visit: Payer: Medicare Other

## 2021-11-16 ENCOUNTER — Ambulatory Visit (INDEPENDENT_AMBULATORY_CARE_PROVIDER_SITE_OTHER): Payer: Medicare Other

## 2021-11-16 DIAGNOSIS — Z780 Asymptomatic menopausal state: Secondary | ICD-10-CM

## 2021-11-16 DIAGNOSIS — Z0000 Encounter for general adult medical examination without abnormal findings: Secondary | ICD-10-CM

## 2021-11-16 NOTE — Patient Instructions (Signed)
Ms. Quiett , ?Thank you for taking time to come for your Medicare Wellness Visit. I appreciate your ongoing commitment to your health goals. Please review the following plan we discussed and let me know if I can assist you in the future.  ? ?Screening recommendations/referrals: ?Colonoscopy: no longer required  ?Mammogram: no longer required  ?Bone Density: 11/16/2021 ?Recommended yearly ophthalmology/optometry visit for glaucoma screening and checkup ?Recommended yearly dental visit for hygiene and checkup ? ?Vaccinations: ?Influenza vaccine: completed  ?Pneumococcal vaccine: completed  ?Tdap vaccine: due  ?Shingles vaccine: will consider    ? ?Advanced directives: yes  ? ?Conditions/risks identified: none  ? ?Next appointment: none  ? ? ?Preventive Care 85 Years and Older, Female ?Preventive care refers to lifestyle choices and visits with your health care provider that can promote health and wellness. ?What does preventive care include? ?A yearly physical exam. This is also called an annual well check. ?Dental exams once or twice a year. ?Routine eye exams. Ask your health care provider how often you should have your eyes checked. ?Personal lifestyle choices, including: ?Daily care of your teeth and gums. ?Regular physical activity. ?Eating a healthy diet. ?Avoiding tobacco and drug use. ?Limiting alcohol use. ?Practicing safe sex. ?Taking low-dose aspirin every day. ?Taking vitamin and mineral supplements as recommended by your health care provider. ?What happens during an annual well check? ?The services and screenings done by your health care provider during your annual well check will depend on your age, overall health, lifestyle risk factors, and family history of disease. ?Counseling  ?Your health care provider may ask you questions about your: ?Alcohol use. ?Tobacco use. ?Drug use. ?Emotional well-being. ?Home and relationship well-being. ?Sexual activity. ?Eating habits. ?History of falls. ?Memory and  ability to understand (cognition). ?Work and work Statistician. ?Reproductive health. ?Screening  ?You may have the following tests or measurements: ?Height, weight, and BMI. ?Blood pressure. ?Lipid and cholesterol levels. These may be checked every 5 years, or more frequently if you are over 32 years old. ?Skin check. ?Lung cancer screening. You may have this screening every year starting at age 48 if you have a 30-pack-year history of smoking and currently smoke or have quit within the past 15 years. ?Fecal occult blood test (FOBT) of the stool. You may have this test every year starting at age 11. ?Flexible sigmoidoscopy or colonoscopy. You may have a sigmoidoscopy every 5 years or a colonoscopy every 10 years starting at age 74. ?Hepatitis C blood test. ?Hepatitis B blood test. ?Sexually transmitted disease (STD) testing. ?Diabetes screening. This is done by checking your blood sugar (glucose) after you have not eaten for a while (fasting). You may have this done every 1-3 years. ?Bone density scan. This is done to screen for osteoporosis. You may have this done starting at age 1. ?Mammogram. This may be done every 1-2 years. Talk to your health care provider about how often you should have regular mammograms. ?Talk with your health care provider about your test results, treatment options, and if necessary, the need for more tests. ?Vaccines  ?Your health care provider may recommend certain vaccines, such as: ?Influenza vaccine. This is recommended every year. ?Tetanus, diphtheria, and acellular pertussis (Tdap, Td) vaccine. You may need a Td booster every 10 years. ?Zoster vaccine. You may need this after age 55. ?Pneumococcal 13-valent conjugate (PCV13) vaccine. One dose is recommended after age 62. ?Pneumococcal polysaccharide (PPSV23) vaccine. One dose is recommended after age 61. ?Talk to your health care provider about which screenings  and vaccines you need and how often you need them. ?This information is  not intended to replace advice given to you by your health care provider. Make sure you discuss any questions you have with your health care provider. ?Document Released: 08/12/2015 Document Revised: 04/04/2016 Document Reviewed: 05/17/2015 ?Elsevier Interactive Patient Education ? 2017 [place]. ? ?Fall Prevention in the Home ?Falls can cause injuries. They can happen to people of all ages. There are many things you can do to make your home safe and to help prevent falls. ?What can I do on the outside of my home? ?Regularly fix the edges of walkways and driveways and fix any cracks. ?Remove anything that might make you trip as you walk through a door, such as a raised step or threshold. ?Trim any bushes or trees on the path to your home. ?Use bright outdoor lighting. ?Clear any walking paths of anything that might make someone trip, such as rocks or tools. ?Regularly check to see if handrails are loose or broken. Make sure that both sides of any steps have handrails. ?Any raised decks and porches should have guardrails on the edges. ?Have any leaves, snow, or ice cleared regularly. ?Use sand or salt on walking paths during winter. ?Clean up any spills in your garage right away. This includes oil or grease spills. ?What can I do in the bathroom? ?Use night lights. ?Install grab bars by the toilet and in the tub and shower. Do not use towel bars as grab bars. ?Use non-skid mats or decals in the tub or shower. ?If you need to sit down in the shower, use a plastic, non-slip stool. ?Keep the floor dry. Clean up any water that spills on the floor as soon as it happens. ?Remove soap buildup in the tub or shower regularly. ?Attach bath mats securely with double-sided non-slip rug tape. ?Do not have throw rugs and other things on the floor that can make you trip. ?What can I do in the bedroom? ?Use night lights. ?Make sure that you have a light by your bed that is easy to reach. ?Do not use any sheets or blankets that  are too big for your bed. They should not hang down onto the floor. ?Have a firm chair that has side arms. You can use this for support while you get dressed. ?Do not have throw rugs and other things on the floor that can make you trip. ?What can I do in the kitchen? ?Clean up any spills right away. ?Avoid walking on wet floors. ?Keep items that you use a lot in easy-to-reach places. ?If you need to reach something above you, use a strong step stool that has a grab bar. ?Keep electrical cords out of the way. ?Do not use floor polish or wax that makes floors slippery. If you must use wax, use non-skid floor wax. ?Do not have throw rugs and other things on the floor that can make you trip. ?What can I do with my stairs? ?Do not leave any items on the stairs. ?Make sure that there are handrails on both sides of the stairs and use them. Fix handrails that are broken or loose. Make sure that handrails are as long as the stairways. ?Check any carpeting to make sure that it is firmly attached to the stairs. Fix any carpet that is loose or worn. ?Avoid having throw rugs at the top or bottom of the stairs. If you do have throw rugs, attach them to the floor with carpet tape. ?  Make sure that you have a light switch at the top of the stairs and the bottom of the stairs. If you do not have them, ask someone to add them for you. ?What else can I do to help prevent falls? ?Wear shoes that: ?Do not have high heels. ?Have rubber bottoms. ?Are comfortable and fit you well. ?Are closed at the toe. Do not wear sandals. ?If you use a stepladder: ?Make sure that it is fully opened. Do not climb a closed stepladder. ?Make sure that both sides of the stepladder are locked into place. ?Ask someone to hold it for you, if possible. ?Clearly mark and make sure that you can see: ?Any grab bars or handrails. ?First and last steps. ?Where the edge of each step is. ?Use tools that help you move around (mobility aids) if they are needed. These  include: ?Canes. ?Walkers. ?Scooters. ?Crutches. ?Turn on the lights when you go into a dark area. Replace any light bulbs as soon as they burn out. ?Set up your furniture so you have a clear path. Avoid movi
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
## 2021-11-20 ENCOUNTER — Ambulatory Visit: Payer: Medicare Other | Admitting: Family Medicine

## 2021-11-27 ENCOUNTER — Encounter: Payer: Medicare Other | Admitting: Family Medicine

## 2021-11-29 ENCOUNTER — Telehealth: Payer: Self-pay

## 2021-11-29 ENCOUNTER — Encounter: Payer: Medicare Other | Admitting: Family Medicine

## 2021-11-29 NOTE — Telephone Encounter (Signed)
Patient's friend who is on DPR is concerned about patient. Benjamine Mola states patient has some issues going on with dementia and other stuff but wont disclose unless its was a [employer] scientist. Wondered if someone could call her back in regards to these concerns.  ?

## 2021-11-30 NOTE — Telephone Encounter (Signed)
Attempted to call no answer LM  ?

## 2021-12-01 ENCOUNTER — Other Ambulatory Visit: Payer: Self-pay

## 2021-12-01 ENCOUNTER — Ambulatory Visit (INDEPENDENT_AMBULATORY_CARE_PROVIDER_SITE_OTHER): Payer: Medicare Other | Admitting: Family Medicine

## 2021-12-01 VITALS — BP 126/78 | HR 76 | Temp 98.1°F | Resp 18 | Ht 62.0 in | Wt 145.8 lb

## 2021-12-01 DIAGNOSIS — R1111 Vomiting without nausea: Secondary | ICD-10-CM

## 2021-12-01 DIAGNOSIS — F418 Other specified anxiety disorders: Secondary | ICD-10-CM

## 2021-12-01 DIAGNOSIS — R413 Other amnesia: Secondary | ICD-10-CM | POA: Diagnosis not present

## 2021-12-01 DIAGNOSIS — N1832 Chronic kidney disease, stage 3b: Secondary | ICD-10-CM

## 2021-12-01 DIAGNOSIS — E039 Hypothyroidism, unspecified: Secondary | ICD-10-CM | POA: Diagnosis not present

## 2021-12-01 LAB — COMPREHENSIVE METABOLIC PANEL
ALT: 6 U/L (ref 0–35)
AST: 11 U/L (ref 0–37)
Albumin: 4.1 g/dL (ref 3.5–5.2)
Alkaline Phosphatase: 78 U/L (ref 39–117)
BUN: 18 mg/dL (ref 6–23)
CO2: 26 mEq/L (ref 19–32)
Calcium: 9.2 mg/dL (ref 8.4–10.5)
Chloride: 101 mEq/L (ref 96–112)
Creatinine, Ser: 1.4 mg/dL — ABNORMAL HIGH (ref 0.40–1.20)
GFR: 35.9 mL/min — ABNORMAL LOW (ref >60.00)
Glucose, Bld: 88 mg/dL (ref 70–99)
Potassium: 4.7 mEq/L (ref 3.5–5.1)
Sodium: 138 mEq/L (ref 135–145)
Total Bilirubin: 0.5 mg/dL (ref 0.2–1.2)
Total Protein: 6.9 g/dL (ref 6.0–8.3)

## 2021-12-01 LAB — LIPASE: Lipase: 30 U/L (ref 11.0–59.0)

## 2021-12-01 LAB — TSH: TSH: 6.92 u[IU]/mL — ABNORMAL HIGH (ref 0.35–5.50)
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
## 2021-12-01 NOTE — Patient Instructions (Addendum)
I will refer you to memory specialist and checking labs.  ?Avoid alcohol.  ?Try to monitor amount of food to lessen overeating cause of vomiting, but I will check other labs. Please follow up in next 2 weeks with your roommate to discuss these symptoms further.  ? ?Return to the clinic or go to the nearest emergency room if any of your symptoms worsen or new symptoms occur. ? ? ?

## 2021-12-02 ENCOUNTER — Encounter: Payer: Self-pay | Admitting: Family Medicine

## 2021-12-04 ENCOUNTER — Telehealth: Payer: Self-pay | Admitting: Family Medicine

## 2021-12-04 ENCOUNTER — Encounter: Payer: Self-pay | Admitting: Physician Assistant

## 2021-12-04 NOTE — Telephone Encounter (Signed)
Pt called in asking for a refill on the citalopram and the hydroxyzine pt uses [facility].  ?

## 2021-12-05 ENCOUNTER — Other Ambulatory Visit: Payer: Self-pay

## 2021-12-05 DIAGNOSIS — F5104 Psychophysiologic insomnia: Secondary | ICD-10-CM

## 2021-12-05 MED ORDER — HYDROXYZINE HCL 25 MG PO TABS: 12.5000 mg | ORAL_TABLET | Freq: Every evening | ORAL | 1 refills | Status: DC | PRN

## 2021-12-05 NOTE — Telephone Encounter (Signed)
Sent, pt was called and informed states she didn't request the hydroxyzine  ?

## 2021-12-08 ENCOUNTER — Encounter: Payer: Self-pay | Admitting: Physician Assistant

## 2021-12-08 ENCOUNTER — Ambulatory Visit: Payer: Medicare Other | Admitting: Physician Assistant

## 2021-12-14 ENCOUNTER — Ambulatory Visit: Payer: Medicare Other | Admitting: Family Medicine

## 2021-12-19 ENCOUNTER — Encounter: Payer: Self-pay | Admitting: Psychology

## 2021-12-19 ENCOUNTER — Encounter: Payer: Self-pay | Admitting: Physician Assistant

## 2021-12-19 ENCOUNTER — Other Ambulatory Visit: Payer: Medicare Other

## 2021-12-19 ENCOUNTER — Ambulatory Visit: Payer: Medicare Other | Admitting: Physician Assistant

## 2021-12-19 VITALS — BP 139/81 | HR 111 | Resp 18 | Ht 64.0 in | Wt 146.0 lb

## 2021-12-19 DIAGNOSIS — F0280 Dementia in other diseases classified elsewhere without behavioral disturbance: Secondary | ICD-10-CM

## 2021-12-19 DIAGNOSIS — R413 Other amnesia: Secondary | ICD-10-CM

## 2021-12-19 DIAGNOSIS — G309 Alzheimer's disease, unspecified: Secondary | ICD-10-CM

## 2021-12-19 HISTORY — DX: Dementia in other diseases classified elsewhere, unspecified severity, without behavioral disturbance, psychotic disturbance, mood disturbance, and anxiety: F02.80

## 2021-12-19 LAB — VITAMIN B12: Vitamin B-12: 464 pg/mL (ref 211–911)

## 2021-12-19 MED ORDER — MEMANTINE HCL 10 MG PO TABS: ORAL_TABLET | ORAL | 11 refills | Status: DC

## 2021-12-19 NOTE — Progress Notes (Signed)
Assessment/Plan:   Allison Chavez is a very pleasant 81 y.o. year old RH female with  a history of hypothyroidism, depression, prior history of alcohol abuse, hypertension, CKD stage III, hyperlipidemia, degenerative retinal disease-macular degeneration insomnia, anxiety, depression, DJD, seen today for evaluation of memory loss. Her MoCA today was 9/30 with deficiencies in most areas.  Delayed recall 0/5    Recommendations:   Dementia likely due to Alzheimer's Disease with behavioral disturbance  MRI brain with/without contrast to assess for underlying structural abnormality and assess vascular load  Agree with psychiatric evaluation for the treatment of mood disorder including depression and anxiety (as per PCP) Check B12   Start Memantine 10 mg: Take 1 tablet (10 mg at night) for 2 weeks, then increase to 1 tablet (10 mg) twice a day.   Side effects discussed. Folllow up in 3 months  Subjective:    The patient is seen in neurologic consultation at the request of Wendie Agreste, MD for the evaluation of memory.  The patient is accompanied by her friend Golden Circle son who supplements the history.   How long did patient have memory difficulties?  Memory problems began about 5 years ago, worsening over the last 3 years.  She was evaluated by Dr. Leta Baptist in August 2019, and there was suspicion of memory issues due to alcohol abuse versus memory loss versus neurodegenerative dementia.   She was not placed on antidementia medication at that time. She lost to follow up during the Covid pandemic.  Symptoms worsened over time, and she has began to lose sense of time. She feels that her memory loss is due to age  Patient lives with: Wynona Neat over the last 2 years, who noticed changes as well.   repeats oneself?  "She repeats things over and over again at times ""sometimes a few minutes later " Disoriented when walking into a room?  Patient denies. But Golden Circle says: " not al the time,  but  sometimes she does not know where the bathroom is" One time she told Golden Circle " I dont remember how to get undressed to change my clothes" Leaving objects in unusual places?  Patient denies   Ambulates  with difficulty?   Patient denies.  She walks about 1 mile a day. Recent falls?  Patient denies  " she has fear of falling" Any head injuries?  Patient denies   History of seizures?   Patient denies   Wandering behavior?  Patient denies   Patient drives?   Patient no longer drives   Any mood changes such irritability agitation?  She admits to some mood changes  Any history of depression?:  Patient denies clinical depression   Hallucinations?  Patient denies "Maybe, one time  I tought I  was asking for Golden Circle because she was calling me" Paranoia?  Patient denies   Patient reports that he sleeps well without vivid dreams, REM behavior or sleepwalking    History of sleep apnea?  Patient denies   Any hygiene concerns?   She lives in her nightgown. She does not like to change clothes Independent of bathing and dressing?  Endorsed, may need to be reminded to shower Does the patient needs help with medications?  Libby monitors the meds Who is in charge of the finances?  Patient is in charge and denies missing any bills  Any changes in appetite?  Patient has been seen by her PCP, because the patient has a tendency to binge.  She may eat  large quantities of food that several times and then developed nausea.  She drinks adequate amount of water. Patient have trouble swallowing? Patient denies   Does the patient cook?  Patient denies   Any kitchen accidents such as leaving the stove on? Patient denies   Any headaches?  Patient denies   The double vision? Patient denies   Any focal numbness or tingling?  Patient denies   Chronic back pain Patient denies   Unilateral weakness?  Patient denies   Any tremors?  Patient denies   Any history of anosmia?  Patient denies   Any incontinence of urine?  Patient  denies   Any bowel dysfunction?   Patient denies  History of heavy alcohol intake?  Endorsed.  However, over the last couple of years, she drinks occasionally  History of heavy tobacco use?  Patient denies   Family history of dementia?  other  Alzheimer's disease   She is a retired Education officer, museum    Allergies  Allergen Reactions   Ace Inhibitors Cough    Cough    Sulfa Antibiotics Hives   Wellbutrin [Bupropion] Rash    Only with generic, immediate release    Current Outpatient Medications  Medication Instructions   citalopram (CELEXA) 20 mg, Oral, Daily   folic acid (FOLVITE) 1 mg, Oral, Daily   hydrOXYzine (ATARAX) 12.5 mg, Oral, At bedtime PRN   memantine (NAMENDA) 10 MG tablet Take 1 tablet (10 mg at night) for 2 weeks, then increase to 1 tablet (10 mg) twice a day   Multiple Vitamins-Minerals (PRESERVISION AREDS 2+MULTI VIT) CAPS Oral, Daily   Multiple Vitamins-Minerals (WOMENS ONE DAILY) TABS 1 tablet, Oral, 1 Day/Dose   thiamine 100 mg, Oral, Daily     VITALS:   Vitals:   12/19/21 1345  BP: 139/81  Pulse: (!) 111  Resp: 18  SpO2: 98%  Weight: 146 lb (66.2 kg)  Height: '5\' 4"'$  (1.626 m)     PHYSICAL EXAM   HEENT:  Normocephalic, atraumatic. The mucous membranes are moist. The superficial temporal arteries are without ropiness or tenderness. Cardiovascular: Regular rate and rhythm. Lungs: Clear to auscultation bilaterally. Neck: There are no carotid bruits noted bilaterally.  NEUROLOGICAL:     View : No data to display.             03/04/2018    2:54 PM  MMSE - Mini Mental State Exam  Orientation to time 5  Orientation to Place 5  Registration 3  Attention/ Calculation 4  Recall 3  Language- name 2 objects 2  Language- repeat 1  Language- follow 3 step command 3  Language- read & follow direction 1  Write a sentence 1  Copy design 1  Total score 29     Orientation:  Alert and oriented to person, place and not to time. No aphasia or dysarthria.  Fund of knowledge is appropriate. Recent and remote memory impaired.  Attention and concentration are reduced  Able to name objects and repeat phrases. Delayed recall  0/5 Cranial nerves: There is good facial symmetry. Extraocular muscles are intact and visual fields are reduced to confrontational testing due to macular degeneration . Speech is fluent and clear, but may become tangential. Soft palate rises symmetrically and there is no tongue deviation. Hearing is intact to conversational tone. Tone: Tone is good throughout. Sensation: Sensation is intact to light touch and pinprick throughout. Vibration is intact at the bilateral big toe.There is no extinction with double simultaneous stimulation. There is  no sensory dermatomal level identified. Coordination: The patient has no difficulty with RAM's or FNF bilaterally. Normal finger to nose  Motor: Strength is 5/5 in the bilateral upper and lower extremities. There is no pronator drift. There are no fasciculations noted. DTR's: Deep tendon reflexes are 2/4 at the bilateral biceps, triceps, brachioradialis, patella and achilles.  Plantar responses are downgoing bilaterally. Gait and Station: The patient is able to ambulate without difficulty but moves slower The patient is able to ambulate in a tandem fashion. The patient is able to stand in the Romberg position.    Thank you for allowing [place] the opportunity to participate in the care of this nice patient. Please do not hesitate to contact us for any questions or concerns.   Total time spent on today's visit was *** minutes dedicated to this patient today, preparing to see patient, examining the patient, ordering tests and/or medications and counseling the patient, documenting clinical information in the EHR or other health record, independently interpreting results and communicating results to the patient/family, discussing treatment and goals, answering patient's questions and coordinating care.  Cc:   Wendie Agreste, MD  Sharene Butters 12/19/2021 9:01 PM

## 2021-12-19 NOTE — Patient Instructions (Addendum)
It was a pleasure to see you today at our office.   Recommendations:  Neurocognitive evaluation at our office MRI of the brain, the radiology office will call you to arrange you appointment Check labs today  Start Memantine 10 mg: Take 1 tablet (10 mg at night) for 2 weeks, then increase to 1 tablet (10 mg) twice a day.    Follow up in 3 months   Whom to call:  Memory  decline, memory medications: Call out office [phone]   For psychiatric meds, mood meds: Please have your primary care physician manage these medications.   Counseling regarding caregiver distress, including caregiver depression, anxiety and issues regarding community resources, adult day care programs, adult living facilities, or memory care questions:   Feel free to contact Dewey, Social Worker at [phone]   For assessment of decision of mental capacity and competency:  Call Dr. Anthoney Harada, geriatric psychiatrist at [phone]  For guidance in geriatric dementia issues please call [facility] [phone]  For guidance regarding [facility] and if placement were needed at the facility, contact Arnell Asal, Social Worker tel: [phone]  If you have any severe symptoms of a stroke, or other severe issues such as confusion,severe chills or fever, etc call 911 or go to the ER as you may need to be evaluate further   Feel free to visit Facebook page " [email or web address]" for tips of how to care for people with memory problems.      RECOMMENDATIONS FOR ALL PATIENTS WITH MEMORY PROBLEMS: 1. Continue to exercise (Recommend 30 minutes of walking everyday, or 3 hours every week) 2. Increase social interactions - continue going to [place] and enjoy social gatherings with friends and family 3. Eat healthy, avoid fried foods and eat more fruits and vegetables 4. Maintain adequate blood pressure, blood sugar, and blood cholesterol level. Reducing the risk of stroke and  cardiovascular disease also helps promoting better memory. 5. Avoid stressful situations. Live a simple life and avoid aggravations. Organize your time and prepare for the next day in anticipation. 6. Sleep well, avoid any interruptions of sleep and avoid any distractions in the bedroom that may interfere with adequate sleep quality 7. Avoid sugar, avoid sweets as there is a strong link between excessive sugar intake, diabetes, and cognitive impairment We discussed the Mediterranean diet, which has been shown to help patients reduce the risk of progressive memory disorders and reduces cardiovascular risk. This includes eating fish, eat fruits and green leafy vegetables, nuts like almonds and hazelnuts, walnuts, and also use olive oil. Avoid fast foods and fried foods as much as possible. Avoid sweets and sugar as sugar use has been linked to worsening of memory function.  There is always a concern of gradual progression of memory problems. If this is the case, then we may need to adjust level of care according to patient needs. Support, both to the patient and caregiver, should then be put into place.      You have been referred for a neuropsychological evaluation (i.e., evaluation of memory and thinking abilities). Please bring someone with you to this appointment if possible, as it is helpful for the doctor to hear from both you and another adult who knows you well. Please bring eyeglasses and hearing aids if you wear them.    The evaluation will take approximately 3 hours and has two parts:   The first part is a clinical interview with the neuropsychologist (Dr. Melvyn Novas or Dr. Nicole Kindred).  During the interview, the neuropsychologist will speak with you and the individual you brought to the appointment.    The second part of the evaluation is testing with the doctor's technician Hinton Dyer or Maudie Mercury). During the testing, the technician will ask you to remember different types of material, solve problems, and  answer some questionnaires. Your family member will not be present for this portion of the evaluation.   Please note: We must reserve several hours of the neuropsychologist's time and the psychometrician's time for your evaluation appointment. As such, there is a No-Show fee of $100. If you are unable to attend any of your appointments, please contact our office as soon as possible to reschedule.    FALL PRECAUTIONS: Be cautious when walking. Scan the area for obstacles that may increase the risk of trips and falls. When getting up in the mornings, sit up at the edge of the bed for a few minutes before getting out of bed. Consider elevating the bed at the head end to avoid drop of blood pressure when getting up. Walk always in a well-lit room (use night lights in the walls). Avoid area rugs or power cords from appliances in the middle of the walkways. Use a walker or a cane if necessary and consider physical therapy for balance exercise. Get your eyesight checked regularly.  FINANCIAL OVERSIGHT: Supervision, especially oversight when making financial decisions or transactions is also recommended.  HOME SAFETY: Consider the safety of the kitchen when operating appliances like stoves, microwave oven, and blender. Consider having supervision and share cooking responsibilities until no longer able to participate in those. Accidents with firearms and other hazards in the house should be identified and addressed as well.   ABILITY TO BE LEFT ALONE: If patient is unable to contact 911 operator, consider using LifeLine, or when the need is there, arrange for someone to stay with patients. Smoking is a fire hazard, consider supervision or cessation. Risk of wandering should be assessed by caregiver and if detected at any point, supervision and safe proof recommendations should be instituted.  MEDICATION SUPERVISION: Inability to self-administer medication needs to be constantly addressed. Implement a mechanism  to ensure safe administration of the medications.   DRIVING: Regarding driving, in patients with progressive memory problems, driving will be impaired. We advise to have someone else do the driving if trouble finding directions or if minor accidents are reported. Independent driving assessment is available to determine safety of driving.   If you are interested in the driving assessment, you can contact the following:  [employer] in [place]  [place] [phone] or [phone]    [place] refers to food and lifestyle choices that are based on the traditions of countries located on the [employer]. This way of eating has been shown to help prevent certain conditions and improve outcomes for people who have chronic diseases, like kidney disease and heart disease. What are tips for following this plan? Lifestyle  Cook and eat meals together with your family, when possible. Drink enough fluid to keep your urine clear or pale yellow. Be physically active every day. This includes: Aerobic exercise like running or swimming. Leisure activities like gardening, walking, or housework. Get 7-8 hours of sleep each night. If recommended by your health care provider, drink red wine in moderation. This means 1 glass a day for nonpregnant women and 2 glasses a day for men. A  glass of wine equals 5 oz (150 mL). Reading food labels  Check the serving size of packaged foods. For foods such as rice and pasta, the serving size refers to the amount of cooked product, not dry. Check the total fat in packaged foods. Avoid foods that have saturated fat or trans fats. Check the ingredients list for added sugars, such as corn syrup. Shopping  At the grocery store, buy most of your food from the areas near the walls of the store. This includes: Fresh  fruits and vegetables (produce). Grains, beans, nuts, and seeds. Some of these may be available in unpackaged forms or large amounts (in bulk). Fresh seafood. Poultry and eggs. Low-fat dairy products. Buy whole ingredients instead of prepackaged foods. Buy fresh fruits and vegetables in-season from local farmers markets. Buy frozen fruits and vegetables in resealable bags. If you do not have access to quality fresh seafood, buy precooked frozen shrimp or canned fish, such as tuna, salmon, or sardines. Buy small amounts of raw or cooked vegetables, salads, or olives from the deli or salad bar at your store. Stock your pantry so you always have certain foods on hand, such as olive oil, canned tuna, canned tomatoes, rice, pasta, and beans. Cooking  Cook foods with extra-virgin olive oil instead of using butter or other vegetable oils. Have meat as a side dish, and have vegetables or grains as your main dish. This means having meat in small portions or adding small amounts of meat to foods like pasta or stew. Use beans or vegetables instead of meat in common dishes like chili or lasagna. Experiment with different cooking methods. Try roasting or broiling vegetables instead of steaming or sauteing them. Add frozen vegetables to soups, stews, pasta, or rice. Add nuts or seeds for added healthy fat at each meal. You can add these to yogurt, salads, or vegetable dishes. Marinate fish or vegetables using olive oil, lemon juice, garlic, and fresh herbs. Meal planning  Plan to eat 1 vegetarian meal one day each week. Try to work up to 2 vegetarian meals, if possible. Eat seafood 2 or more times a week. Have healthy snacks readily available, such as: Vegetable sticks with hummus. Greek yogurt. Fruit and nut trail mix. Eat balanced meals throughout the week. This includes: Fruit: 2-3 servings a day Vegetables: 4-5 servings a day Low-fat dairy: 2 servings a day Fish, poultry, or lean meat: 1 serving  a day Beans and legumes: 2 or more servings a week Nuts and seeds: 1-2 servings a day Whole grains: 6-8 servings a day Extra-virgin olive oil: 3-4 servings a day Limit red meat and sweets to only a few servings a month What are my food choices? Mediterranean diet Recommended Grains: Whole-grain pasta. Brown rice. Bulgar wheat. Polenta. Couscous. Whole-wheat bread. [place]. Vegetables: Artichokes. Beets. Broccoli. Cabbage. Carrots. Eggplant. Green beans. Chard. Kale. Spinach. Onions. Leeks. Peas. Squash. Tomatoes. Peppers. Radishes. Fruits: Apples. Apricots. Avocado. Berries. Bananas. Cherries. Dates. Figs. Grapes. Lemons. Melon. Oranges. Peaches. Plums. Pomegranate. Meats and other protein foods: Beans. Almonds. Sunflower seeds. Pine nuts. Peanuts. [place]. Salmon. Scallops. Shrimp. [place]. Tilapia. Clams. Oysters. Eggs. Dairy: Low-fat milk. Cheese. Greek yogurt. Beverages: Water. Red wine. Herbal tea. Fats and oils: Extra virgin olive oil. Avocado oil. Grape seed oil. Sweets and desserts: [place] yogurt with honey. Baked apples. Poached pears. Trail mix. Seasoning and other foods: Basil. Cilantro. Coriander. Cumin. Mint. Parsley. Sage. Rosemary. Tarragon. Garlic. Oregano. Thyme. Pepper. Balsalmic vinegar. Tahini. Hummus. Tomato sauce. Olives. Mushrooms. Limit these Grains:  Prepackaged pasta or rice dishes. Prepackaged cereal with added sugar. Vegetables: Deep fried potatoes (french fries). Fruits: Fruit canned in syrup. Meats and other protein foods: Beef. Pork. Lamb. Poultry with skin. Hot dogs. [place] Salines. Dairy: Ice cream. Sour cream. Whole milk. Beverages: Juice. Sugar-sweetened soft drinks. Beer. Liquor and spirits. Fats and oils: Butter. Canola oil. Vegetable oil. Beef fat (tallow). Lard. Sweets and desserts: Cookies. Cakes. Pies. Candy. Seasoning and other foods: Mayonnaise. Premade sauces and marinades. The items listed may not be a complete list. Talk with your dietitian about what  dietary choices are right for you. Summary The Mediterranean diet includes both food and lifestyle choices. Eat a variety of fresh fruits and vegetables, beans, nuts, seeds, and whole grains. Limit the amount of red meat and sweets that you eat. Talk with your health care provider about whether it is safe for you to drink red wine in moderation. This means 1 glass a day for nonpregnant women and 2 glasses a day for men. A glass of wine equals 5 oz (150 mL). This information is not intended to replace advice given to you by your health care provider. Make sure you discuss any questions you have with your health care provider. Document Released: 03/08/2016 Document Revised: 04/10/2016 Document Reviewed: 03/08/2016 Elsevier Interactive Patient Education  2017 [employer].

## 2021-12-25 LAB — VITAMIN B1: Vitamin B1 (Thiamine): 8 nmol/L (ref 8–30)

## 2021-12-26 NOTE — Progress Notes (Signed)
Please inform the patient that her B12 and B1 are o the lower side of normal, but I would still replenish it with over the counter B1 and B12 daily  for extra coverage

## 2021-12-27 ENCOUNTER — Encounter: Payer: Self-pay | Admitting: Family Medicine

## 2021-12-27 ENCOUNTER — Ambulatory Visit (INDEPENDENT_AMBULATORY_CARE_PROVIDER_SITE_OTHER): Payer: Medicare Other | Admitting: Family Medicine

## 2021-12-27 VITALS — BP 132/84 | HR 87 | Temp 98.6°F | Resp 16 | Ht 61.0 in | Wt 146.0 lb

## 2021-12-27 DIAGNOSIS — Z0000 Encounter for general adult medical examination without abnormal findings: Secondary | ICD-10-CM

## 2021-12-27 DIAGNOSIS — R413 Other amnesia: Secondary | ICD-10-CM

## 2021-12-27 DIAGNOSIS — Z23 Encounter for immunization: Secondary | ICD-10-CM

## 2021-12-27 DIAGNOSIS — R1111 Vomiting without nausea: Secondary | ICD-10-CM

## 2021-12-27 DIAGNOSIS — N1832 Chronic kidney disease, stage 3b: Secondary | ICD-10-CM

## 2021-12-27 LAB — BASIC METABOLIC PANEL
BUN: 18 mg/dL (ref 6–23)
CO2: 25 mEq/L (ref 19–32)
Calcium: 9.6 mg/dL (ref 8.4–10.5)
Chloride: 108 mEq/L (ref 96–112)
Creatinine, Ser: 1.52 mg/dL — ABNORMAL HIGH (ref 0.40–1.20)
GFR: 32.51 mL/min — ABNORMAL LOW (ref >60.00)
Glucose, Bld: 96 mg/dL (ref 70–99)
Potassium: 4.2 mEq/L (ref 3.5–5.1)
Sodium: 143 mEq/L (ref 135–145)

## 2021-12-27 NOTE — Patient Instructions (Addendum)
If vomiting returns, we need to look at other causes.  New covid bivalent booster at your pharmacy - I do recommend.  Pneumonia vaccine today.  Shingles vaccine at your pharmacy.  Walking for exercise is great. Drink plenty of water, regular meals.  Recheck in 3 months - sooner if needed.   Preventive Care 40 Years and Older, Female Preventive care refers to lifestyle choices and visits with your health care provider that can promote health and wellness. Preventive care visits are also called wellness exams. What can I expect for my preventive care visit? Counseling Your health care provider may ask you questions about your: Medical history, including: Past medical problems. Family medical history. Pregnancy and menstrual history. History of falls. Current health, including: Memory and ability to understand (cognition). Emotional well-being. Home life and relationship well-being. Sexual activity and sexual health. Lifestyle, including: Alcohol, nicotine or tobacco, and drug use. Access to firearms. Diet, exercise, and sleep habits. Work and work Statistician. Sunscreen use. Safety issues such as seatbelt and bike helmet use. Physical exam Your health care provider will check your: Height and weight. These may be used to calculate your BMI (body mass index). BMI is a measurement that tells if you are at a healthy weight. Waist circumference. This measures the distance around your waistline. This measurement also tells if you are at a healthy weight and may help predict your risk of certain diseases, such as type 2 diabetes and high blood pressure. Heart rate and blood pressure. Body temperature. Skin for abnormal spots. What immunizations do I need?  Vaccines are usually given at various ages, according to a schedule. Your health care provider will recommend vaccines for you based on your age, medical history, and lifestyle or other factors, such as travel or where you  work. What tests do I need? Screening Your health care provider may recommend screening tests for certain conditions. This may include: Lipid and cholesterol levels. Hepatitis C test. Hepatitis B test. HIV (human immunodeficiency virus) test. STI (sexually transmitted infection) testing, if you are at risk. Lung cancer screening. Colorectal cancer screening. Diabetes screening. This is done by checking your blood sugar (glucose) after you have not eaten for a while (fasting). Mammogram. Talk with your health care provider about how often you should have regular mammograms. BRCA-related cancer screening. This may be done if you have a family history of breast, ovarian, tubal, or peritoneal cancers. Bone density scan. This is done to screen for osteoporosis. Talk with your health care provider about your test results, treatment options, and if necessary, the need for more tests. Follow these instructions at home: Eating and drinking  Eat a diet that includes fresh fruits and vegetables, whole grains, lean protein, and low-fat dairy products. Limit your intake of foods with high amounts of sugar, saturated fats, and salt. Take vitamin and mineral supplements as recommended by your health care provider. Do not drink alcohol if your health care provider tells you not to drink. If you drink alcohol: Limit how much you have to 0-1 drink a day. Know how much alcohol is in your drink. In the [place] one drink equals one 12 oz bottle of beer (355 mL), one 5 oz glass of wine (148 mL), or one 1 oz glass of hard liquor (44 mL). Lifestyle Brush your teeth every morning and night with fluoride toothpaste. Floss one time each day. Exercise for at least 30 minutes 5 or more days each week. Do not use any products that contain nicotine  or tobacco. These products include cigarettes, chewing tobacco, and vaping devices, such as e-cigarettes. If you need help quitting, ask your health care provider. Do not  use drugs. If you are sexually active, practice safe sex. Use a condom or other form of protection in order to prevent STIs. Take aspirin only as told by your health care provider. Make sure that you understand how much to take and what form to take. Work with your health care provider to find out whether it is safe and beneficial for you to take aspirin daily. Ask your health care provider if you need to take a cholesterol-lowering medicine (statin). Find healthy ways to manage stress, such as: Meditation, yoga, or listening to music. Journaling. Talking to a trusted person. Spending time with friends and family. Minimize exposure to UV radiation to reduce your risk of skin cancer. Safety Always wear your seat belt while driving or riding in a vehicle. Do not drive: If you have been drinking alcohol. Do not ride with someone who has been drinking. When you are tired or distracted. While texting. If you have been using any mind-altering substances or drugs. Wear a helmet and other protective equipment during sports activities. If you have firearms in your house, make sure you follow all gun safety procedures. What's next? Visit your health care provider once a year for an annual wellness visit. Ask your health care provider how often you should have your eyes and teeth checked. Stay up to date on all vaccines. This information is not intended to replace advice given to you by your health care provider. Make sure you discuss any questions you have with your health care provider. Document Revised: 01/11/2021 Document Reviewed: 01/11/2021 Elsevier Patient Education  [place].
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
## 2021-12-28 ENCOUNTER — Telehealth: Payer: Self-pay

## 2021-12-28 NOTE — Telephone Encounter (Signed)
Given to Dr. Carlota Raspberry for review. Once reviewed the immunization/vaccine orders will be scanned into the chart.

## 2022-01-03 ENCOUNTER — Encounter: Payer: Self-pay | Admitting: Family Medicine

## 2022-01-04 ENCOUNTER — Telehealth: Payer: Self-pay

## 2022-01-04 NOTE — Telephone Encounter (Signed)
Called pt to find out if she did receive immunization prior to leaving appt on 12/27/21 it is not documented and the immunization consent form has already been sent out if she did have this done

## 2022-01-05 NOTE — Telephone Encounter (Signed)
Confirmed with pt she did have done, I will go back and document appropriately

## 2022-01-31 ENCOUNTER — Telehealth: Payer: Self-pay

## 2022-01-31 NOTE — Telephone Encounter (Signed)
[facility] called, patient has not scheduled MRI wo contrast. Multiple attempts to get patient scheduled. Final attempt was made. I called patient and asked her to contact them. Still has not scheduled.

## 2022-03-21 ENCOUNTER — Ambulatory Visit: Payer: Medicare Other | Admitting: Physician Assistant

## 2022-03-21 ENCOUNTER — Encounter: Payer: Self-pay | Admitting: Physician Assistant

## 2022-03-21 DIAGNOSIS — Z029 Encounter for administrative examinations, unspecified: Secondary | ICD-10-CM

## 2022-03-29 ENCOUNTER — Ambulatory Visit: Payer: Medicare Other | Admitting: Family Medicine

## 2022-03-29 DIAGNOSIS — F418 Other specified anxiety disorders: Secondary | ICD-10-CM

## 2022-03-29 DIAGNOSIS — F5104 Psychophysiologic insomnia: Secondary | ICD-10-CM

## 2022-03-29 DIAGNOSIS — I10 Essential (primary) hypertension: Secondary | ICD-10-CM

## 2022-03-29 DIAGNOSIS — E785 Hyperlipidemia, unspecified: Secondary | ICD-10-CM

## 2022-09-18 ENCOUNTER — Encounter: Payer: Self-pay | Admitting: Psychology

## 2022-09-18 DIAGNOSIS — F411 Generalized anxiety disorder: Secondary | ICD-10-CM | POA: Insufficient documentation

## 2022-09-18 DIAGNOSIS — M47812 Spondylosis without myelopathy or radiculopathy, cervical region: Secondary | ICD-10-CM | POA: Insufficient documentation

## 2022-09-18 DIAGNOSIS — F329 Major depressive disorder, single episode, unspecified: Secondary | ICD-10-CM | POA: Insufficient documentation

## 2022-09-19 ENCOUNTER — Encounter: Payer: Medicare Other | Admitting: Psychology

## 2022-09-19 ENCOUNTER — Encounter: Payer: Self-pay | Admitting: Psychology

## 2022-09-26 ENCOUNTER — Encounter: Payer: Medicare Other | Admitting: Psychology

## 2022-12-11 ENCOUNTER — Telehealth: Payer: Self-pay | Admitting: Family Medicine

## 2022-12-11 NOTE — Telephone Encounter (Signed)
Contacted Henrene Dodge to schedule their annual wellness visit. Appointment made for 12/13/2022.  Thank you,  [facility] Direct dial  [phone]

## 2022-12-13 ENCOUNTER — Ambulatory Visit (INDEPENDENT_AMBULATORY_CARE_PROVIDER_SITE_OTHER): Payer: Medicare Other | Admitting: *Deleted

## 2022-12-13 DIAGNOSIS — Z0000 Encounter for general adult medical examination without abnormal findings: Secondary | ICD-10-CM | POA: Diagnosis not present

## 2022-12-13 NOTE — Progress Notes (Signed)
Subjective:   Allison Chavez is a 82 y.o. female who presents for Medicare Annual (Subsequent) preventive examination.  I connected with  TOSHA CANNADAY on 12/13/22 by a telephone enabled telemedicine application and verified that I am speaking with the correct person using two identifiers.   I discussed the limitations of evaluation and management by telemedicine. The patient expressed understanding and agreed to proceed.  Patient location: home  Provider location: telephone/home    Review of Systems     Cardiac Risk Factors include: advanced age (>46men, >108 women);hypertension     Objective:    Today's Vitals   There is no height or weight on file to calculate BMI.     12/13/2022    1:04 PM 12/19/2021    1:46 PM 11/16/2021   12:05 PM 12/26/2017    3:00 PM 10/12/2017    3:20 PM 01/22/2017    1:59 PM 12/09/2014    8:25 AM  Advanced Directives  Does Patient Have a Medical Advance Directive? Yes Yes Yes No No No No  Type of Estate agent of [employer] Power of [place];Living will      Copy of Healthcare Power of Attorney in Chart? Yes - validated most recent copy scanned in chart (See row information)  No - copy requested      Would patient like information on creating a medical advance directive?    Yes (Inpatient - patient requests chaplain consult to create a medical advance directive) No - Patient declined Yes (ED - Information included in AVS) No - patient declined information    Current Medications (verified) Outpatient Encounter Medications as of 12/13/2022  Medication Sig   folic acid (FOLVITE) 1 MG tablet Take 1 tablet (1 mg total) by mouth daily.   memantine (NAMENDA) 10 MG tablet Take 1 tablet (10 mg at night) for 2 weeks, then increase to 1 tablet (10 mg) twice a day   Multiple Vitamins-Minerals (WOMENS ONE DAILY) TABS Take 1 tablet by mouth 1 day or 1 dose.   thiamine 100 MG tablet Take 1 tablet (100 mg total) by mouth daily.    citalopram (CELEXA) 20 MG tablet Take 1 tablet (20 mg total) by mouth daily. (Patient not taking: Reported on 12/27/2021)   hydrOXYzine (ATARAX) 25 MG tablet Take 0.5 tablets (12.5 mg total) by mouth at bedtime as needed for anxiety. (Patient not taking: Reported on 12/27/2021)   Multiple Vitamins-Minerals (PRESERVISION AREDS 2+MULTI VIT) CAPS Take by mouth daily. (Patient not taking: Reported on 12/27/2021)   No facility-administered encounter medications on file as of 12/13/2022.    Allergies (verified) Ace inhibitors, Sulfa antibiotics, and Wellbutrin [bupropion]   History: Past Medical History:  Diagnosis Date   Alcohol abuse 12/25/2017   Cataract    CKD (chronic kidney disease), stage III 12/25/2017   Degenerative retinal drusen of left eye 12/08/2019   Degenerative retinal drusen of right eye 12/08/2019   Dementia due to Alzheimer's disease 12/19/2021   DJD (degenerative joint disease) of cervical spine    DJD of shoulder, right    Generalized anxiety disorder    HTN (hypertension) 03/07/2012   Hyperlipidemia 01/11/2015   Hypokalemia 12/25/2017   Hyponatremia 12/25/2017   Insomnia 10/08/2013   Intermediate stage nonexudative age-related macular degeneration of both eyes 12/08/2019   Major depressive disorder    Osteopenia 01/11/2015   Updated DEXA 01/17/2015. Osteopenia. Stable since 11/24/2002.   Pneumonia    as a child   Pseudophakia of both  eyes 12/08/2019   Renal insufficiency, mild    Senile entropion of right lower eyelid 06/07/2020   Sigmoid diverticulosis 01/08/2017   Noted on colonoscopy   Vitreous prolapse of left eye 12/08/2019   Past Surgical History:  Procedure Laterality Date   CLAVICLE SURGERY Right    from [facility]   COLONOSCOPY     EYE SURGERY Bilateral    cataract surgery with lens implants   HARDWARE REMOVAL Left 12/09/2014   Procedure: LEFT SHOULDER HARDWARE REMOVAL;  Surgeon: Francena Hanly, MD;  Location: [facility] OR;  Service: Orthopedics;  Laterality: Left;    ORIF SHOULDER FRACTURE Left 09/23/2014   Procedure: OPEN REDUCTION INTERNAL FIXATION (ORIF) SHOULDER FRACTURE;  Surgeon: Senaida Lange, MD;  Location: [facility] OR;  Service: Orthopedics;  Laterality: Left;   TONSILLECTOMY     adenoids- childhood   Family History  Problem Relation Age of Onset   Cancer Father        Brain   Diverticulitis Father    Cancer Maternal Grandfather    Stroke Paternal Grandmother    Heart disease Paternal Grandfather    Heart attack Brother    Cancer Brother        colon   Diabetes Brother    Social History   Socioeconomic History   Marital status: Married    Spouse name: Fredrik Cove   Number of children: 3   Years of education: College   Highest education level: Bachelor's degree (e.g., BA, AB, BS)  Occupational History   Occupation: Engineer, agricultural  Tobacco Use   Smoking status: Former    Packs/day: 0.30    Years: 45.00    Additional pack years: 0.00    Total pack years: 13.50    Types: Cigarettes    Quit date: 11/29/2001    Years since quitting: 21.0   Smokeless tobacco: Never   Tobacco comments:    quit age 33  Vaping Use   Vaping Use: Never used  Substance and Sexual Activity   Alcohol use: Yes    Comment: special occasions   Drug use: No   Sexual activity: Never    Partners: Male    Birth control/protection: Post-menopausal  Other Topics Concern   Not on file  Social History Narrative   Has not lived with husband (3rd marriage), Fredrik Cove, for many years, though they maintain an intermittently sexual relationship. They talk almost daily.  He lives with his female partner.    Exercise- walks every day, for about a mile on average   Diet- Makes healthy choices and drinks a lot of water.    Sleep- 8 hours a night   Caffeine- coffee, 1/2 cup daily   Social Determinants of Health   Financial Resource Strain: Low Risk  (12/13/2022)   Overall Financial Resource Strain (CARDIA)    Difficulty of Paying Living Expenses: Not hard at all  Food  Insecurity: No Food Insecurity (12/13/2022)   Hunger Vital Sign    Worried About Running Out of Food in the Last Year: Never true    Ran Out of Food in the Last Year: Never true  Transportation Needs: No Transportation Needs (12/13/2022)   PRAPARE - Administrator, Civil Service (Medical): No    Lack of Transportation (Non-Medical): No  Physical Activity: Insufficiently Active (12/13/2022)   Exercise Vital Sign    Days of Exercise per Week: 4 days    Minutes of Exercise per Session: 30 min  Stress: No Stress Concern Present (12/13/2022)  [employer] of Occupational Health - Occupational Stress Questionnaire    Feeling of Stress : Not at all  Social Connections: Socially Integrated (12/13/2022)   Social Connection and Isolation Panel [NHANES]    Frequency of Communication with Friends and Family: More than three times a week    Frequency of Social Gatherings with Friends and Family: Once a week    Attends Religious Services: More than 4 times per year    Active Member of [employer] or Organizations: Yes    Attends Banker Meetings: 1 to 4 times per year    Marital Status: Married    Tobacco Counseling Counseling given: Not Answered Tobacco comments: quit age 63   Clinical Intake:  Pre-visit preparation completed: Yes  Pain : No/denies pain     Diabetes: No  How often do you need to have someone help you when you read instructions, pamphlets, or other written materials from your doctor or pharmacy?: 1 - Never  Diabetic?  no  Interpreter Needed?: No  Information entered by :: Remi Haggard LPN   Activities of Daily Living    12/13/2022    1:04 PM 12/27/2021    1:39 PM  In your present state of health, do you have any difficulty performing the following activities:  Hearing? 0 0  Vision? 0 0  Difficulty concentrating or making decisions? 0 0  Walking or climbing stairs? 0 0  Dressing or bathing? 0 0  Doing errands, shopping? 0 1  Comment  pt  has someone to help and drive  Using the Toilet? N   In the past six months, have you accidently leaked urine? N   Do you have problems with loss of bowel control? N   Managing your Medications? N   Managing your Finances? N   Housekeeping or managing your Housekeeping? N     Patient Care Team: Shade Flood, MD as PCP - General (Family Medicine) Venancio Poisson, MD as Consulting Physician (Dermatology) Ernesto Rutherford, MD as Consulting Physician (Ophthalmology) Vida Rigger, MD as Consulting Physician (Gastroenterology)  Indicate any recent Medical Services you may have received from other than [facility] providers in the past year (date may be approximate).     Assessment:   This is a routine wellness examination for Allison Chavez.  Hearing/Vision screen Hearing Screening - Comments:: No trouble hearing Vision Screening - Comments:: [place] Up to date  Dietary issues and exercise activities discussed: Current Exercise Habits: Home exercise routine, Type of exercise: stretching;walking, Time (Minutes): 40, Frequency (Times/Week): 4, Weekly Exercise (Minutes/Week): 160, Intensity: Mild   Goals Addressed             This Visit's Progress    Patient Stated       No goals       Depression Screen    12/13/2022    1:12 PM 12/27/2021    1:34 PM 11/16/2021   12:06 PM 10/02/2021   11:04 AM 10/14/2020    1:05 PM 03/30/2020   11:42 AM 12/30/2019    9:56 AM  PHQ 2/9 Scores  PHQ - 2 Score 0 0 0 0 0 0 0  PHQ- 9 Score 0 0  1       Fall Risk    12/13/2022    1:03 PM 12/27/2021    1:34 PM 12/19/2021    1:46 PM 12/01/2021   11:35 AM 11/16/2021   12:06 PM  Fall Risk   Falls in the past year? 0 0 0 0 0  Number falls in past yr: 0 0 0 0 0  Injury with Fall? 0 0 0 0 0  Risk for fall due to :  No Fall Risks  History of fall(s)   Follow up Falls evaluation completed;Education provided;Falls prevention discussed Falls evaluation completed  Falls evaluation completed Falls evaluation completed    FALL  RISK PREVENTION PERTAINING TO THE HOME:  Any stairs in or around the home? Yes  If so, are there any without handrails? Yes  Home free of loose throw rugs in walkways, pet beds, electrical cords, etc? Yes  Adequate lighting in your home to reduce risk of falls? Yes   ASSISTIVE DEVICES UTILIZED TO PREVENT FALLS:  Life alert? No  Use of a cane, walker or w/c? No  Grab bars in the bathroom? Yes  Shower chair or bench in shower? Yes  Elevated toilet seat or a handicapped toilet? No   TIMED UP AND GO:  Was the test performed? No .    Cognitive Function:    03/04/2018    2:54 PM  MMSE - Mini Mental State Exam  Orientation to time 5  Orientation to Place 5  Registration 3  Attention/ Calculation 4  Recall 3  Language- name 2 objects 2  Language- repeat 1  Language- follow 3 step command 3  Language- read & follow direction 1  Write a sentence 1  Copy design 1  Total score 29      12/20/2021   12:00 PM  Montreal Cognitive Assessment   Visuospatial/ Executive (0/5) 0  Naming (0/3) 3  Attention: Read list of digits (0/2) 1  Attention: Read list of letters (0/1) 1  Attention: Serial 7 subtraction starting at 100 (0/3) 0  Language: Repeat phrase (0/2) 2  Language : Fluency (0/1) 1  Abstraction (0/2) 1  Delayed Recall (0/5) 0  Orientation (0/6) 2  Total 11  Adjusted Score (based on education) 11      12/13/2022    1:07 PM 12/27/2021    1:34 PM  6CIT Screen  What Year? 4 points 4 points  What month? 3 points 3 points  What time? 3 points 3 points  Count back from 20 4 points 0 points  Months in reverse 4 points 0 points  Repeat phrase 10 points 8 points  Total Score 28 points 18 points    Immunizations Immunization History  Administered Date(s) Administered   Fluad Quad(high Dose 65+) 08/06/2019, 03/30/2020   Influenza Split 08/29/2010, 06/06/2012   Influenza,inj,Quad PF,6+ Mos 08/22/2015, 09/24/2016, 09/26/2017   PFIZER(Purple Top)SARS-COV-2 Vaccination  08/12/2019, 09/01/2019, 07/18/2020   Pneumococcal Polysaccharide-23 08/28/2006, 12/27/2021   Td 10/25/2009   Zoster, Live 07/30/2009    TDAP status: Due, Education has been provided regarding the importance of this vaccine. Advised may receive this vaccine at local pharmacy or Health Dept. Aware to provide a copy of the vaccination record if obtained from local pharmacy or Health Dept. Verbalized acceptance and understanding.  Flu Vaccine status: Up to date  Pneumococcal vaccine status: Up to date  Covid-19 vaccine status: Information provided on how to obtain vaccines.   Qualifies for Shingles Vaccine? Yes   Zostavax completed No   Shingrix Completed?: No.    Education has been provided regarding the importance of this vaccine. Patient has been advised to call insurance company to determine out of pocket expense if they have not yet received this vaccine. Advised may also receive vaccine at local pharmacy or Health Dept. Verbalized acceptance and understanding.  Screening Tests Health Maintenance  Topic Date Due   DTaP/Tdap/Td (2 - Tdap) 10/26/2019   Pneumonia Vaccine 93+ Years old (2 of 2 - PCV) 12/28/2022   COVID-19 Vaccine (4 - 2023-24 season) 12/29/2022 (Originally 03/30/2022)   Zoster Vaccines- Shingrix (1 of 2) 03/15/2023 (Originally [birth year]/1992)   INFLUENZA VACCINE  02/28/2023   Medicare Annual Wellness (AWV)  12/13/2023   DEXA SCAN  Completed   HPV VACCINES  Aged Out    Health Maintenance  Health Maintenance Due  Topic Date Due   DTaP/Tdap/Td (2 - Tdap) 10/26/2019   Pneumonia Vaccine 74+ Years old (2 of 2 - PCV) 12/28/2022    Colorectal cancer screening: No longer required.   Mammogram status: No longer required due to  .  Bone Density status: Completed 2016. Results reflect: Bone density results: OSTEOPENIA. Repeat every 2 years.  Lung Cancer Screening: (Low Dose CT Chest recommended if Age 71-80 years, 30 pack-year currently smoking OR have quit w/in 15years.) does  not qualify.   Lung Cancer Screening Referral:   Additional Screening:  Hepatitis C Screening: does not qualify; Completed   Vision Screening: Recommended annual ophthalmology exams for early detection of glaucoma and other disorders of the eye. Is the patient up to date with their annual eye exam?  Yes  Who is the provider or what is the name of the office in which the patient attends annual eye exams? [facility] If pt is not established with a provider, would they like to be referred to a provider to establish care? No .   Dental Screening: Recommended annual dental exams for proper oral hygiene  Community Resource Referral / Chronic Care Management: CRR required this visit?  No   CCM required this visit?  No      Plan:     I have personally reviewed and noted the following in the patient's chart:   Medical and social history Use of alcohol, tobacco or illicit drugs  Current medications and supplements including opioid prescriptions. Patient is not currently taking opioid prescriptions. Functional ability and status Nutritional status Physical activity Advanced directives List of other physicians Hospitalizations, surgeries, and ER visits in previous 12 months Vitals Screenings to include cognitive, depression, and falls Referrals and appointments  In addition, I have reviewed and discussed with patient certain preventive protocols, quality metrics, and best practice recommendations. A written personalized care plan for preventive services as well as general preventive health recommendations were provided to patient.     Remi Haggard, LPN   1/61/0960   Nurse Notes:   Tried to make the patient an appointment she has no show appointment from 02-2022.     She did have some trouble with her memory test.   She stated she would call back and schedule an appointment.

## 2022-12-13 NOTE — Patient Instructions (Signed)
Allison Chavez , Thank you for taking time to come for your Medicare Wellness Visit. I appreciate your ongoing commitment to your health goals. Please review the following plan we discussed and let me know if I can assist you in the future.   Screening recommendations/referrals: Colonoscopy: no longer required Mammogram: no longer required Bone Density: Education provided Recommended yearly ophthalmology/optometry visit for glaucoma screening and checkup Recommended yearly dental visit for hygiene and checkup  Vaccinations: Influenza vaccine: up to date Pneumococcal vaccine: up to date Tdap vaccine: Education provided Shingles vaccine: Education provided    Advanced directives: yes on file      Preventive Care 65 Years and Older, Female Preventive care refers to lifestyle choices and visits with your health care provider that can promote health and wellness. What does preventive care include? A yearly physical exam. This is also called an annual well check. Dental exams once or twice a year. Routine eye exams. Ask your health care provider how often you should have your eyes checked. Personal lifestyle choices, including: Daily care of your teeth and gums. Regular physical activity. Eating a healthy diet. Avoiding tobacco and drug use. Limiting alcohol use. Practicing safe sex. Taking low-dose aspirin every day. Taking vitamin and mineral supplements as recommended by your health care provider. What happens during an annual well check? The services and screenings done by your health care provider during your annual well check will depend on your age, overall health, lifestyle risk factors, and family history of disease. Counseling  Your health care provider may ask you questions about your: Alcohol use. Tobacco use. Drug use. Emotional well-being. Home and relationship well-being. Sexual activity. Eating habits. History of falls. Memory and ability to understand  (cognition). Work and work Astronomer. Reproductive health. Screening  You may have the following tests or measurements: Height, weight, and BMI. Blood pressure. Lipid and cholesterol levels. These may be checked every 5 years, or more frequently if you are over 82 years old. Skin check. Lung cancer screening. You may have this screening every year starting at age 72 if you have a 30-pack-year history of smoking and currently smoke or have quit within the past 15 years. Fecal occult blood test (FOBT) of the stool. You may have this test every year starting at age 51. Flexible sigmoidoscopy or colonoscopy. You may have a sigmoidoscopy every 5 years or a colonoscopy every 10 years starting at age 13. Hepatitis C blood test. Hepatitis B blood test. Sexually transmitted disease (STD) testing. Diabetes screening. This is done by checking your blood sugar (glucose) after you have not eaten for a while (fasting). You may have this done every 1-3 years. Bone density scan. This is done to screen for osteoporosis. You may have this done starting at age 65. Mammogram. This may be done every 1-2 years. Talk to your health care provider about how often you should have regular mammograms. Talk with your health care provider about your test results, treatment options, and if necessary, the need for more tests. Vaccines  Your health care provider may recommend certain vaccines, such as: Influenza vaccine. This is recommended every year. Tetanus, diphtheria, and acellular pertussis (Tdap, Td) vaccine. You may need a Td booster every 10 years. Zoster vaccine. You may need this after age 3. Pneumococcal 13-valent conjugate (PCV13) vaccine. One dose is recommended after age 28. Pneumococcal polysaccharide (PPSV23) vaccine. One dose is recommended after age 15. Talk to your health care provider about which screenings and vaccines you need and how  often you need them. This information is not intended to  replace advice given to you by your health care provider. Make sure you discuss any questions you have with your health care provider. Document Released: 08/12/2015 Document Revised: 04/04/2016 Document Reviewed: 05/17/2015 Elsevier Interactive Patient Education  2017 [employer] Prevention in the Home Falls can cause injuries. They can happen to people of all ages. There are many things you can do to make your home safe and to help prevent falls. What can I do on the outside of my home? Regularly fix the edges of walkways and driveways and fix any cracks. Remove anything that might make you trip as you walk through a door, such as a raised step or threshold. Trim any bushes or trees on the path to your home. Use bright outdoor lighting. Clear any walking paths of anything that might make someone trip, such as rocks or tools. Regularly check to see if handrails are loose or broken. Make sure that both sides of any steps have handrails. Any raised decks and porches should have guardrails on the edges. Have any leaves, snow, or ice cleared regularly. Use sand or salt on walking paths during winter. Clean up any spills in your garage right away. This includes oil or grease spills. What can I do in the bathroom? Use night lights. Install grab bars by the toilet and in the tub and shower. Do not use towel bars as grab bars. Use non-skid mats or decals in the tub or shower. If you need to sit down in the shower, use a plastic, non-slip stool. Keep the floor dry. Clean up any water that spills on the floor as soon as it happens. Remove soap buildup in the tub or shower regularly. Attach bath mats securely with double-sided non-slip rug tape. Do not have throw rugs and other things on the floor that can make you trip. What can I do in the bedroom? Use night lights. Make sure that you have a light by your bed that is easy to reach. Do not use any sheets or blankets that are too big for  your bed. They should not hang down onto the floor. Have a firm chair that has side arms. You can use this for support while you get dressed. Do not have throw rugs and other things on the floor that can make you trip. What can I do in the kitchen? Clean up any spills right away. Avoid walking on wet floors. Keep items that you use a lot in easy-to-reach places. If you need to reach something above you, use a strong step stool that has a grab bar. Keep electrical cords out of the way. Do not use floor polish or wax that makes floors slippery. If you must use wax, use non-skid floor wax. Do not have throw rugs and other things on the floor that can make you trip. What can I do with my stairs? Do not leave any items on the stairs. Make sure that there are handrails on both sides of the stairs and use them. Fix handrails that are broken or loose. Make sure that handrails are as long as the stairways. Check any carpeting to make sure that it is firmly attached to the stairs. Fix any carpet that is loose or worn. Avoid having throw rugs at the top or bottom of the stairs. If you do have throw rugs, attach them to the floor with carpet tape. Make sure that you have a  light switch at the top of the stairs and the bottom of the stairs. If you do not have them, ask someone to add them for you. What else can I do to help prevent falls? Wear shoes that: Do not have high heels. Have rubber bottoms. Are comfortable and fit you well. Are closed at the toe. Do not wear sandals. If you use a stepladder: Make sure that it is fully opened. Do not climb a closed stepladder. Make sure that both sides of the stepladder are locked into place. Ask someone to hold it for you, if possible. Clearly mark and make sure that you can see: Any grab bars or handrails. First and last steps. Where the edge of each step is. Use tools that help you move around (mobility aids) if they are needed. These  include: Canes. Walkers. Scooters. Crutches. Turn on the lights when you go into a dark area. Replace any light bulbs as soon as they burn out. Set up your furniture so you have a clear path. Avoid moving your furniture around. If any of your floors are uneven, fix them. If there are any pets around you, be aware of where they are. Review your medicines with your doctor. Some medicines can make you feel dizzy. This can increase your chance of falling. Ask your doctor what other things that you can do to help prevent falls. This information is not intended to replace advice given to you by your health care provider. Make sure you discuss any questions you have with your health care provider. Document Released: 05/12/2009 Document Revised: 12/22/2015 Document Reviewed: 08/20/2014 Elsevier Interactive Patient Education  2017 [employer].

## 2023-06-25 ENCOUNTER — Telehealth: Payer: Self-pay | Admitting: Family Medicine

## 2023-06-25 NOTE — Telephone Encounter (Signed)
Received phone call from pt's friend stating that pt had taken a fall and is experiencing some pain in her ribs. I asked if she was experiencing any trouble breathing and he stated yes. I told him I was going to send him to the triage line for further evaluation. He did not want to be transferred and insisted on bringing her in 11/27 at 11:40am. Pt's friend called yesterday and he was advised to call back first thing this morning to get that appt slot.

## 2023-06-25 NOTE — Telephone Encounter (Signed)
FYI. Patient and patient companion refused triage

## 2023-06-25 NOTE — Telephone Encounter (Signed)
Called pt a female answered phone and was unable to hear pt. Called 1x more and no answer

## 2023-06-25 NOTE — Telephone Encounter (Signed)
I am sorry to hear about her fall.  I am happy to see her tomorrow, and if only having discomfort with taking a deep breath, I think that is reasonable to be seen tomorrow.  If she is short of breath or any difficulty with regular breathing, does need to be seen today in the emergency room.  Please call her with this information and can let her know these are my recommendations as well.  Thanks

## 2023-06-25 NOTE — Telephone Encounter (Signed)
LM to call back so we can discuss

## 2023-06-26 ENCOUNTER — Other Ambulatory Visit: Payer: Medicare Other

## 2023-06-26 ENCOUNTER — Ambulatory Visit: Payer: Medicare Other | Admitting: Family Medicine

## 2023-06-26 ENCOUNTER — Encounter: Payer: Self-pay | Admitting: Family Medicine

## 2023-06-26 ENCOUNTER — Ambulatory Visit (INDEPENDENT_AMBULATORY_CARE_PROVIDER_SITE_OTHER)
Admission: RE | Admit: 2023-06-26 | Discharge: 2023-06-26 | Disposition: A | Discharge status: Home / Self Care | Payer: Medicare Other | Source: Ambulatory Visit | Attending: Family Medicine | Admitting: Family Medicine

## 2023-06-26 VITALS — BP 122/72 | HR 78 | Temp 97.6°F | Ht 61.0 in | Wt 130.0 lb

## 2023-06-26 DIAGNOSIS — M25512 Pain in left shoulder: Secondary | ICD-10-CM

## 2023-06-26 DIAGNOSIS — R269 Unspecified abnormalities of gait and mobility: Secondary | ICD-10-CM | POA: Diagnosis not present

## 2023-06-26 DIAGNOSIS — R0781 Pleurodynia: Secondary | ICD-10-CM

## 2023-06-26 DIAGNOSIS — Z23 Encounter for immunization: Secondary | ICD-10-CM

## 2023-06-26 DIAGNOSIS — R296 Repeated falls: Secondary | ICD-10-CM

## 2023-06-26 DIAGNOSIS — Z043 Encounter for examination and observation following other accident: Secondary | ICD-10-CM | POA: Diagnosis not present

## 2023-06-26 DIAGNOSIS — Z91148 Patient's other noncompliance with medication regimen for other reason: Secondary | ICD-10-CM

## 2023-06-26 DIAGNOSIS — S2242XD Multiple fractures of ribs, left side, subsequent encounter for fracture with routine healing: Secondary | ICD-10-CM | POA: Diagnosis not present

## 2023-06-26 DIAGNOSIS — G309 Alzheimer's disease, unspecified: Secondary | ICD-10-CM

## 2023-06-26 DIAGNOSIS — R29898 Other symptoms and signs involving the musculoskeletal system: Secondary | ICD-10-CM

## 2023-06-26 DIAGNOSIS — F0280 Dementia in other diseases classified elsewhere without behavioral disturbance: Secondary | ICD-10-CM

## 2023-06-26 NOTE — Progress Notes (Signed)
Subjective:  Patient ID: Allison Chavez, female    DOB: 07-16-1941  Age: 82 y.o. MRN: [id number]  CC:  Chief Complaint  Patient presents with   Fall    Here with [name], family friend. Has fallen several times in the last few weeks. Some pain in her ribs.     HPI [name] presents for above. Her with family friend [name].   History of falls Last visit with me in May 2023.  History of dementia, on memantine at that time, plan for continued care under neurology. No visit with neurology since last year.   History per friend [name]. Has been falling more in past month to 6 weeks. Loses balance. 6 falls in past month. No prior medical eval. FAll about 3 weeks ago with rib pain in left side since that time.she denies dyspnea, or hard time breathing. Hurts to get up into bed at times. Now lower bed to make it easier.  Has roommate that lives with her - helps with meals, laundry. Dresses and bathes self.  [name] has noticed her holding left arm in past 3-4 weeks- sore in shoulder. remote history of clavicle injury about 12 years ago, but less movement of left arm - reluctant to use it.  She denies any leg weakness, phil has noticed her walking slower and more cautious. No headache. No slurred speech or facial weakness.  No current meds. Off celexa and namenda.  Has access to walker but difficulty with transitions of flooring at home.  Has shower chair. No fevers, no urinary symptoms, otherwise feels well. Stool and urinary incontinence for months to past year.  Wearing depends undergarments.    History Patient Active Problem List   Diagnosis Date Noted   Generalized anxiety disorder 09/18/2022   Major depressive disorder 09/18/2022   DJD (degenerative joint disease) of cervical spine 09/18/2022   Dementia due to Alzheimer's disease 12/19/2021   Senile entropion of right lower eyelid 06/07/2020   Intermediate stage nonexudative age-related macular degeneration of both eyes 12/08/2019    Vitreous prolapse of left eye 12/08/2019   Degenerative retinal drusen of left eye 12/08/2019   Degenerative retinal drusen of right eye 12/08/2019   Pseudophakia of both eyes 12/08/2019   CKD (chronic kidney disease), stage III 12/25/2017   Hyponatremia 12/25/2017   Hypokalemia 12/25/2017   Alcohol abuse 12/25/2017   Sigmoid diverticulosis 01/15/2017   Osteopenia 01/11/2015   Hyperlipidemia 01/11/2015   Insomnia 10/08/2013   HTN (hypertension) 03/07/2012   Past Medical History:  Diagnosis Date   Alcohol abuse 12/25/2017   Cataract    CKD (chronic kidney disease), stage III 12/25/2017   Degenerative retinal drusen of left eye 12/08/2019   Degenerative retinal drusen of right eye 12/08/2019   Dementia due to Alzheimer's disease 12/19/2021   DJD (degenerative joint disease) of cervical spine    DJD of shoulder, right    Generalized anxiety disorder    HTN (hypertension) 03/07/2012   Hyperlipidemia 01/11/2015   Hypokalemia 12/25/2017   Hyponatremia 12/25/2017   Insomnia 10/08/2013   Intermediate stage nonexudative age-related macular degeneration of both eyes 12/08/2019   Major depressive disorder    Osteopenia 01/11/2015   Updated DEXA 01/17/2015. Osteopenia. Stable since 11/24/2002.   Pneumonia    as a child   Pseudophakia of both eyes 12/08/2019   Renal insufficiency, mild    Senile entropion of right lower eyelid 06/07/2020   Sigmoid diverticulosis 01/08/2017   Noted on colonoscopy   Vitreous prolapse  of left eye 12/08/2019   Past Surgical History:  Procedure Laterality Date   CLAVICLE SURGERY Right    [facility]   COLONOSCOPY     EYE SURGERY Bilateral    cataract surgery with lens implants   HARDWARE REMOVAL Left 12/09/2014   Procedure: LEFT SHOULDER HARDWARE REMOVAL;  Surgeon: Francena Hanly, MD;  Location: [facility] OR;  Service: Orthopedics;  Laterality: Left;   ORIF SHOULDER FRACTURE Left 09/23/2014   Procedure: OPEN REDUCTION INTERNAL FIXATION (ORIF) SHOULDER FRACTURE;   Surgeon: Senaida Lange, MD;  Location: [facility] OR;  Service: Orthopedics;  Laterality: Left;   TONSILLECTOMY     adenoids- childhood   Allergies  Allergen Reactions   Ace Inhibitors Cough    Cough    Sulfa Antibiotics Hives   Wellbutrin [Bupropion] Rash    Only with generic, immediate release   Prior to Admission medications   Medication Sig Start Date End Date Taking? Authorizing Provider  citalopram (CELEXA) 20 MG tablet Take 1 tablet (20 mg total) by mouth daily. 10/02/21  Yes Shade Flood, MD  folic acid (FOLVITE) 1 MG tablet Take 1 tablet (1 mg total) by mouth daily. 12/28/17  Yes Albertine Grates, MD  hydrOXYzine (ATARAX) 25 MG tablet Take 0.5 tablets (12.5 mg total) by mouth at bedtime as needed for anxiety. 12/05/21  Yes Shade Flood, MD  memantine (NAMENDA) 10 MG tablet Take 1 tablet (10 mg at night) for 2 weeks, then increase to 1 tablet (10 mg) twice a day 12/19/21  Yes Gwynneth Munson, Sung Amabile, PA-C  Multiple Vitamins-Minerals (PRESERVISION AREDS 2+MULTI VIT) CAPS Take by mouth daily.   Yes [provider]  Multiple Vitamins-Minerals (WOMENS ONE DAILY) TABS Take 1 tablet by mouth 1 day or 1 dose.   Yes [provider]  thiamine 100 MG tablet Take 1 tablet (100 mg total) by mouth daily. 12/28/17  Yes Albertine Grates, MD   Social History   Socioeconomic History   Marital status: Married    Spouse name: Fredrik Cove   Number of children: 3   Years of education: [employer] education level: Bachelor's degree (e.g., BA, AB, BS)  Occupational History   Occupation: Engineer, agricultural  Tobacco Use   Smoking status: Former    Current packs/day: 0.00    Average packs/day: 0.3 packs/day for 45.0 years (13.5 ttl pk-yrs)    Types: Cigarettes    Start date: 11/29/1956    Quit date: 11/29/2001    Years since quitting: 21.5   Smokeless tobacco: Never   Tobacco comments:    quit age 30  Vaping Use   Vaping status: Never Used  Substance and Sexual Activity   Alcohol use: Yes    Comment:  special occasions   Drug use: No   Sexual activity: Never    Partners: Male    Birth control/protection: Post-menopausal  Other Topics Concern   Not on file  Social History Narrative   Has not lived with husband (3rd marriage), Fredrik Cove, for many years, though they maintain an intermittently sexual relationship. They talk almost daily.  He lives with his female partner.    Exercise- walks every day, for about a mile on average   Diet- Makes healthy choices and drinks a lot of water.    Sleep- 8 hours a night   Caffeine- coffee, 1/2 cup daily   Social Determinants of Health   Financial Resource Strain: Low Risk  (12/13/2022)   Overall Financial Resource Strain (CARDIA)  Difficulty of Paying Living Expenses: Not hard at all  Food Insecurity: No Food Insecurity (12/13/2022)   Hunger Vital Sign    Worried About Running Out of Food in the Last Year: Never true    Ran Out of Food in the Last Year: Never true  Transportation Needs: No Transportation Needs (12/13/2022)   PRAPARE - Administrator, Civil Service (Medical): No    Lack of Transportation (Non-Medical): No  Physical Activity: Insufficiently Active (12/13/2022)   Exercise Vital Sign    Days of Exercise per Week: 4 days    Minutes of Exercise per Session: 30 min  Stress: No Stress Concern Present (12/13/2022)   [employer] of Occupational Health - Occupational Stress Questionnaire    Feeling of Stress : Not at all  Social Connections: Socially Integrated (12/13/2022)   Social Connection and Isolation Panel [NHANES]    Frequency of Communication with Friends and Family: More than three times a week    Frequency of Social Gatherings with Friends and Family: Once a week    Attends Religious Services: More than 4 times per year    Active Member of [employer] or Organizations: Yes    Attends Banker Meetings: 1 to 4 times per year    Marital Status: Married  Catering manager Violence: Not At Risk (12/13/2022)    Humiliation, Afraid, Rape, and Kick questionnaire    Fear of Current or Ex-Partner: No    Emotionally Abused: No    Physically Abused: No    Sexually Abused: No    Review of Systems Per HPI  Objective:   Vitals:   06/26/23 1140  BP: 122/72  Pulse: 78  Temp: 97.6 F (36.4 C)  SpO2: 96%  Weight: 130 lb (59 kg)  Height: 5\' 1"  (1.549 m)     Physical Exam Vitals reviewed.  Constitutional:      Appearance: Normal appearance. She is well-developed.  HENT:     Head: Normocephalic and atraumatic.  Eyes:     Conjunctiva/sclera: Conjunctivae normal.     Pupils: Pupils are equal, round, and reactive to light.  Neck:     Vascular: No carotid bruit.  Cardiovascular:     Rate and Rhythm: Normal rate and regular rhythm.     Heart sounds: Normal heart sounds.  Pulmonary:     Effort: Pulmonary effort is normal.     Breath sounds: Normal breath sounds.     Comments: Equal breath sounds, denies any focal rib or chest wall tenderness.   Abdominal:     Palpations: Abdomen is soft. There is mass. There is no pulsatile mass.     Tenderness: There is no abdominal tenderness.  Musculoskeletal:     Right lower leg: No edema.     Left lower leg: No edema.     Comments: Left shoulder, limited flexion, extension arm held to the side.  Denies any focal tenderness along clavicle humerus or upper arm.  Able to flex and extend elbow and wrist without discomfort.  Skin:    General: Skin is warm and dry.  Neurological:     Mental Status: She is alert.     Comments: Intelligible speech.  No facial weakness or facial droop.  Equal facial movements.  No nystagmus of eyes.  Left arm is held fixed at her side with slight drawing up of left hand.  Minimal arm movements with gait and short shuffling gait, cautious.  Not using assistive device. Decreased grip strength on left  versus right slight arm weakness with left versus right.   Psychiatric:        Mood and Affect: Mood normal.        Behavior:  Behavior normal.        45 minutes spent during visit, including chart review, counseling and assimilation of information, discussion of multiple issues above including falls and left arm symptoms,  exam, discussion of plan, and chart completion.   Assessment & Plan:  JALILA GOOSBY is a 82 y.o. female . Recurrent falls - Plan: CT HEAD WO CONTRAST ( ), CBC, Comprehensive metabolic panel, TSH, B12  Need for influenza vaccination - Plan: Flu Vaccine Trivalent High Dose (Fluad)  Left arm weakness - Plan: CT HEAD WO CONTRAST ( )  Left shoulder pain, unspecified chronicity - Plan: DG Shoulder Left  Rib pain on left side - Plan: DG Ribs Unilateral W/Chest Left  Dementia due to Alzheimer's disease - Plan: CBC, Comprehensive metabolic panel, TSH, B12  Gait abnormality - Plan: CBC, Comprehensive metabolic panel, TSH, B12  Nonadherence to medication  Unfortunate situation.  History of dementia, but has not been seen since May 2023, by both myself and neurology.  Medication nonadherence as she ran out of meds and has not been seen in follow-up.  Now with increasing falls, short, cautious gait, decreased use of left arm with change in appearance of left hand concerning for possible remote CVA.  Question underlying parkinsonism. -Appears to be uncomfortable at home in her chest with certain movements per friend in office, but she denies pain and no focal bony tenderness on the ribs on exam in office.   Rib x-ray did confirm multiple rib fractures, discussed with patient cough or chest wall pain, hold on pain meds for now.  Shoulder x-ray without apparent fracture.  -Check CT head to eval for recent CVA.  Decreased arm use, weakness not acute issue per friend in office.  Denies acute symptoms at this time.  -Will refer to home health for social work evaluation, physical therapy and occupational therapy evaluation. Will need to determine if safe at home with some assistance or if higher level care  needed. Refer to neuro for updated appt to eval changes above.   -Check CBC, CMP, B12, TSH to rule out metabolic contributor to above.  -Close follow-up with ER precautions given.  Understanding of plan expressed with friend in office.    No orders of the defined types were placed in this encounter.  Patient Instructions  Thank you for coming in today.  I am sorry to hear about your falls.  See information below on fall prevention but I will order home health to evaluate for home safety and see if there are other options to help prevent falls at home.  Use of walker may be safer for now if possible. Please have x-rays and labs at [facility] location below today.  I will also order a head CT scan and they will call you about the timing of that test. Additionally I will try to have neurology reach out to you for an appointment soon to help in the workup of your symptoms.  If any new or worsening symptoms be seen in the emergency room as we discussed.  Otherwise follow-up with me in 2 weeks.  Return to the clinic or go to the nearest emergency room if any of your symptoms worsen or new symptoms occur.  Fall Prevention in the Home, Adult Falls can cause injuries and affect people of all  ages. There are many simple things that you can do to make your home safe and to help prevent falls. If you need it, ask for help making these changes. What actions can I take to prevent falls? General information Use good lighting in all rooms. Make sure to: Replace any light bulbs that burn out. Turn on lights if it is dark and use night-lights. Keep items that you use often in easy-to-reach places. Lower the shelves around your home if needed. Move furniture so that there are clear paths around it. Do not keep throw rugs or other things on the floor that can make you trip. If any of your floors are uneven, fix them. Add color or contrast paint or tape to clearly mark and help you see: Grab bars or  handrails. First and last steps of staircases. Where the edge of each step is. If you use a ladder or stepladder: Make sure that it is fully opened. Do not climb a closed ladder. Make sure the sides of the ladder are locked in place. Have someone hold the ladder while you use it. Know where your pets are as you move through your home. What can I do in the bathroom?     Keep the floor dry. Clean up any water that is on the floor right away. Remove soap buildup in the bathtub or shower. Buildup makes bathtubs and showers slippery. Use non-skid mats or decals on the floor of the bathtub or shower. Attach bath mats securely with double-sided, non-slip rug tape. If you need to sit down while you are in the shower, use a non-slip stool. Install grab bars by the toilet and in the bathtub and shower. Do not use towel bars as grab bars. What can I do in the bedroom? Make sure that you have a light by your bed that is easy to reach. Do not use any sheets or blankets on your bed that hang to the floor. Have a firm bench or chair with side arms that you can use for support when you get dressed. What can I do in the kitchen? Clean up any spills right away. If you need to reach something above you, use a sturdy step stool that has a grab bar. Keep electrical cables out of the way. Do not use floor polish or wax that makes floors slippery. What can I do with my stairs? Do not leave anything on the stairs. Make sure that you have a light switch at the top and the bottom of the stairs. Have them installed if you do not have them. Make sure that there are handrails on both sides of the stairs. Fix handrails that are broken or loose. Make sure that handrails are as long as the staircases. Install non-slip stair treads on all stairs in your home if they do not have carpet. Avoid having throw rugs at the top or bottom of stairs, or secure the rugs with carpet tape to prevent them from moving. Choose a  carpet design that does not hide the edge of steps on the stairs. Make sure that carpet is firmly attached to the stairs. Fix any carpet that is loose or worn. What can I do on the outside of my home? Use bright outdoor lighting. Repair the edges of walkways and driveways and fix any cracks. Clear paths of anything that can make you trip, such as tools or rocks. Add color or contrast paint or tape to clearly mark and help you see high  doorway thresholds. Trim any bushes or trees on the main path into your home. Check that handrails are securely fastened and in good repair. Both sides of all steps should have handrails. Install guardrails along the edges of any raised decks or porches. Have leaves, snow, and ice cleared regularly. Use sand, salt, or ice melt on walkways during winter months if you live where there is ice and snow. In the garage, clean up any spills right away, including grease or oil spills. What other actions can I take? Review your medicines with your health care provider. Some medicines can make you confused or feel dizzy. This can increase your chance of falling. Wear closed-toe shoes that fit well and support your feet. Wear shoes that have rubber soles and low heels. Use a cane, walker, scooter, or crutches that help you move around if needed. Talk with your provider about other ways that you can decrease your risk of falls. This may include seeing a physical therapist to learn to do exercises to improve movement and strength. Where to find more information Centers for Disease Control and Prevention, STEADI: TonerPromos.no [employer] on Aging: BaseRingTones.pl National Institute on Aging: BaseRingTones.pl Contact a health care provider if: You are afraid of falling at home. You feel weak, drowsy, or dizzy at home. You fall at home. Get help right away if you: Lose consciousness or have trouble moving after a fall. Have a fall that causes a head injury. These symptoms may be an  emergency. Get help right away. Call 911. Do not wait to see if the symptoms will go away. Do not drive yourself to the hospital. This information is not intended to replace advice given to you by your health care provider. Make sure you discuss any questions you have with your health care provider. Document Revised: 03/19/2022 Document Reviewed: 03/19/2022 Elsevier Patient Education  2024 Elsevier Inc.     Signed,   Meredith Staggers, MD [facility], [facility] 06/26/23 12:56 PM

## 2023-06-26 NOTE — Patient Instructions (Addendum)
Thank you for coming in today.  I am sorry to hear about your falls.  See information below on fall prevention but I will order home health to evaluate for home safety and see if there are other options to help prevent falls at home.  Use of walker may be safer for now if possible. Please have x-rays and labs at [facility] location below today.  I will also order a head CT scan and they will call you about the timing of that test. Additionally I will try to have neurology reach out to you for an appointment soon to help in the workup of your symptoms.  If any new or worsening symptoms be seen in the emergency room as we discussed.  Otherwise follow-up with me in 2 weeks.  Return to the clinic or go to the nearest emergency room if any of your symptoms worsen or new symptoms occur.  Fall Prevention in the Home, Adult Falls can cause injuries and affect people of all ages. There are many simple things that you can do to make your home safe and to help prevent falls. If you need it, ask for help making these changes. What actions can I take to prevent falls? General information Use good lighting in all rooms. Make sure to: Replace any light bulbs that burn out. Turn on lights if it is dark and use night-lights. Keep items that you use often in easy-to-reach places. Lower the shelves around your home if needed. Move furniture so that there are clear paths around it. Do not keep throw rugs or other things on the floor that can make you trip. If any of your floors are uneven, fix them. Add color or contrast paint or tape to clearly mark and help you see: Grab bars or handrails. First and last steps of staircases. Where the edge of each step is. If you use a ladder or stepladder: Make sure that it is fully opened. Do not climb a closed ladder. Make sure the sides of the ladder are locked in place. Have someone hold the ladder while you use it. Know where your pets are as you move through your  home. What can I do in the bathroom?     Keep the floor dry. Clean up any water that is on the floor right away. Remove soap buildup in the bathtub or shower. Buildup makes bathtubs and showers slippery. Use non-skid mats or decals on the floor of the bathtub or shower. Attach bath mats securely with double-sided, non-slip rug tape. If you need to sit down while you are in the shower, use a non-slip stool. Install grab bars by the toilet and in the bathtub and shower. Do not use towel bars as grab bars. What can I do in the bedroom? Make sure that you have a light by your bed that is easy to reach. Do not use any sheets or blankets on your bed that hang to the floor. Have a firm bench or chair with side arms that you can use for support when you get dressed. What can I do in the kitchen? Clean up any spills right away. If you need to reach something above you, use a sturdy step stool that has a grab bar. Keep electrical cables out of the way. Do not use floor polish or wax that makes floors slippery. What can I do with my stairs? Do not leave anything on the stairs. Make sure that you have a light switch at  the top and the bottom of the stairs. Have them installed if you do not have them. Make sure that there are handrails on both sides of the stairs. Fix handrails that are broken or loose. Make sure that handrails are as long as the staircases. Install non-slip stair treads on all stairs in your home if they do not have carpet. Avoid having throw rugs at the top or bottom of stairs, or secure the rugs with carpet tape to prevent them from moving. Choose a carpet design that does not hide the edge of steps on the stairs. Make sure that carpet is firmly attached to the stairs. Fix any carpet that is loose or worn. What can I do on the outside of my home? Use bright outdoor lighting. Repair the edges of walkways and driveways and fix any cracks. Clear paths of anything that can make you  trip, such as tools or rocks. Add color or contrast paint or tape to clearly mark and help you see high doorway thresholds. Trim any bushes or trees on the main path into your home. Check that handrails are securely fastened and in good repair. Both sides of all steps should have handrails. Install guardrails along the edges of any raised decks or porches. Have leaves, snow, and ice cleared regularly. Use sand, salt, or ice melt on walkways during winter months if you live where there is ice and snow. In the garage, clean up any spills right away, including grease or oil spills. What other actions can I take? Review your medicines with your health care provider. Some medicines can make you confused or feel dizzy. This can increase your chance of falling. Wear closed-toe shoes that fit well and support your feet. Wear shoes that have rubber soles and low heels. Use a cane, walker, scooter, or crutches that help you move around if needed. Talk with your provider about other ways that you can decrease your risk of falls. This may include seeing a physical therapist to learn to do exercises to improve movement and strength. Where to find more information Centers for Disease Control and Prevention, STEADI: TonerPromos.no [employer] on Aging: BaseRingTones.pl National Institute on Aging: BaseRingTones.pl Contact a health care provider if: You are afraid of falling at home. You feel weak, drowsy, or dizzy at home. You fall at home. Get help right away if you: Lose consciousness or have trouble moving after a fall. Have a fall that causes a head injury. These symptoms may be an emergency. Get help right away. Call 911. Do not wait to see if the symptoms will go away. Do not drive yourself to the hospital. This information is not intended to replace advice given to you by your health care provider. Make sure you discuss any questions you have with your health care provider. Document Revised: 03/19/2022  Document Reviewed: 03/19/2022 Elsevier Patient Education  2024 [employer].

## 2023-06-26 NOTE — Telephone Encounter (Signed)
Patient is being seen today at 1140.

## 2023-06-28 ENCOUNTER — Encounter: Payer: Self-pay | Admitting: Family Medicine

## 2023-06-28 ENCOUNTER — Other Ambulatory Visit: Payer: Self-pay | Admitting: Family Medicine

## 2023-06-28 DIAGNOSIS — S2242XA Multiple fractures of ribs, left side, initial encounter for closed fracture: Secondary | ICD-10-CM

## 2023-06-28 NOTE — Progress Notes (Signed)
See xr results. Left rib fx's on XR - CT recommended.

## 2023-07-02 ENCOUNTER — Other Ambulatory Visit: Payer: Medicare Other

## 2023-07-03 ENCOUNTER — Inpatient Hospital Stay
Admission: RE | Admit: 2023-07-03 | Discharge: 2023-07-03 | Discharge status: Home / Self Care | Payer: Medicare Other | Source: Ambulatory Visit | Attending: Family Medicine | Admitting: Family Medicine

## 2023-07-03 ENCOUNTER — Other Ambulatory Visit (INDEPENDENT_AMBULATORY_CARE_PROVIDER_SITE_OTHER): Payer: Medicare Other

## 2023-07-03 DIAGNOSIS — R269 Unspecified abnormalities of gait and mobility: Secondary | ICD-10-CM | POA: Diagnosis not present

## 2023-07-03 DIAGNOSIS — G309 Alzheimer's disease, unspecified: Secondary | ICD-10-CM

## 2023-07-03 DIAGNOSIS — F0280 Dementia in other diseases classified elsewhere without behavioral disturbance: Secondary | ICD-10-CM

## 2023-07-03 DIAGNOSIS — R29898 Other symptoms and signs involving the musculoskeletal system: Secondary | ICD-10-CM

## 2023-07-03 DIAGNOSIS — R296 Repeated falls: Secondary | ICD-10-CM

## 2023-07-03 LAB — CBC
HCT: 27.3 % — ABNORMAL LOW (ref 36.0–46.0)
Hemoglobin: 9 g/dL — ABNORMAL LOW (ref 12.0–15.0)
MCHC: 32.8 g/dL (ref 30.0–36.0)
MCV: 96.3 fL (ref 78.0–100.0)
Platelets: 265 10*3/uL (ref 150.0–400.0)
RBC: 2.84 Mil/uL — ABNORMAL LOW (ref 3.87–5.11)
RDW: 20.8 % — ABNORMAL HIGH (ref 11.5–15.5)
WBC: 5.9 10*3/uL (ref 4.0–10.5)

## 2023-07-03 LAB — COMPREHENSIVE METABOLIC PANEL
ALT: 11 U/L (ref 0–35)
AST: 10 U/L (ref 0–37)
Albumin: 3.7 g/dL (ref 3.5–5.2)
Alkaline Phosphatase: 104 U/L (ref 39–117)
BUN: 19 mg/dL (ref 6–23)
CO2: 27 meq/L (ref 19–32)
Calcium: 9 mg/dL (ref 8.4–10.5)
Chloride: 106 meq/L (ref 96–112)
Creatinine, Ser: 1.36 mg/dL — ABNORMAL HIGH (ref 0.40–1.20)
GFR: 36.76 mL/min — ABNORMAL LOW (ref >60.00)
Glucose, Bld: 97 mg/dL (ref 70–99)
Potassium: 3.4 meq/L — ABNORMAL LOW (ref 3.5–5.1)
Sodium: 140 meq/L (ref 135–145)
Total Bilirubin: 0.8 mg/dL (ref 0.2–1.2)
Total Protein: 6.8 g/dL (ref 6.0–8.3)

## 2023-07-03 LAB — TSH: TSH: 5.67 u[IU]/mL — ABNORMAL HIGH (ref 0.35–5.50)

## 2023-07-04 ENCOUNTER — Telehealth: Payer: Self-pay

## 2023-07-04 NOTE — Progress Notes (Signed)
Pt scheduled on 07/09/23

## 2023-07-04 NOTE — Telephone Encounter (Deleted)
-----   Message from Shade Flood sent at 07/03/2023  9:56 PM EST ----- Call patient, or can discuss with family member.  Good news.  No acute findings on CT head.  Specifically no findings of recent stroke, or mass.

## 2023-07-04 NOTE — Telephone Encounter (Signed)
CT head report:  Good news.  No acute findings on CT head.  Specifically no findings of recent stroke, or mass.   Lab result report: Thyroid test was borderline elevated, but stable from previous readings.  Kidney function borderline elevated but stable from previous readings.  Potassium borderline low.  Can recheck with next blood work.  High potassium foods such as bananas can be helpful or over-the-counter potassium supplement for now is fine.  Blood counts were overall stable.  Anemia noted, but stable from 2019 labs. Planned on 2-week follow-up at her November 27 visit, but I do not see that scheduled at this point.  Please schedule follow-up appointment within the next week if possible.  Would like this discuss these labs and her symptoms further at that time.  Thanks.

## 2023-07-05 NOTE — Telephone Encounter (Signed)
Called no answer, LM to have her return call

## 2023-07-05 NOTE — Telephone Encounter (Signed)
Called patient to discuss but think there was an issue with the phone as the patient hung up while I attempted to inform her of who I was /where I was calling from Will call again later

## 2023-07-05 NOTE — Telephone Encounter (Signed)
Please make pt aware that Dr. Neva Seat also wanted her to have a CT of the chest and [facility] should be trying to contact her to get home health started.  Alcario Drought

## 2023-07-08 NOTE — Telephone Encounter (Signed)
LM to call back about result

## 2023-07-09 ENCOUNTER — Telehealth: Payer: Self-pay

## 2023-07-09 ENCOUNTER — Ambulatory Visit (INDEPENDENT_AMBULATORY_CARE_PROVIDER_SITE_OTHER): Payer: Medicare Other | Admitting: Family Medicine

## 2023-07-09 ENCOUNTER — Other Ambulatory Visit: Payer: Self-pay | Admitting: Family Medicine

## 2023-07-09 VITALS — BP 124/66 | HR 70 | Temp 98.4°F | Ht 61.0 in | Wt 130.0 lb

## 2023-07-09 DIAGNOSIS — D649 Anemia, unspecified: Secondary | ICD-10-CM | POA: Diagnosis not present

## 2023-07-09 DIAGNOSIS — E876 Hypokalemia: Secondary | ICD-10-CM | POA: Diagnosis not present

## 2023-07-09 DIAGNOSIS — R296 Repeated falls: Secondary | ICD-10-CM | POA: Diagnosis not present

## 2023-07-09 DIAGNOSIS — R7989 Other specified abnormal findings of blood chemistry: Secondary | ICD-10-CM

## 2023-07-09 DIAGNOSIS — M25612 Stiffness of left shoulder, not elsewhere classified: Secondary | ICD-10-CM | POA: Diagnosis not present

## 2023-07-09 DIAGNOSIS — R29898 Other symptoms and signs involving the musculoskeletal system: Secondary | ICD-10-CM | POA: Diagnosis not present

## 2023-07-09 DIAGNOSIS — M79642 Pain in left hand: Secondary | ICD-10-CM | POA: Diagnosis not present

## 2023-07-09 DIAGNOSIS — S2242XA Multiple fractures of ribs, left side, initial encounter for closed fracture: Secondary | ICD-10-CM | POA: Diagnosis not present

## 2023-07-09 NOTE — Telephone Encounter (Signed)
Patient in office today for an appointment to follow up on falls and discuss results, provided a hard copy of result summary as well as the lab values so this can be stored, also updated phone numbers so we will be able to get ahold of someone in the future about her results

## 2023-07-09 NOTE — Patient Instructions (Addendum)
Because of possible side effects, I would like the decision to restart Namenda to come from Neurology.   I will recheck thyroid test today - it was only borderline elevated.   I will recheck anemia tests today to see if you need to meet with other specialist.   I will order hand xray, but may need to meet with hand specialist if no broken bones seen.   I will refer you to shoulder specialist to decide on options for your left shoulder and decrease in movement past few months.   It appears [facility] will be evaluating for home health. Let me know if you don't hear form them in the next few days.   Return to the clinic or go to the nearest emergency room if any of your symptoms worsen or new symptoms occur.  Please have x-ray of hand performed as well as repeat lab work within the next week if possible at the [employer] [facility] location below.  [facility] or xray: Walk in 8:30-4:30 during weekdays, no appointment needed [address]  [place], [place] [address]

## 2023-07-09 NOTE — Telephone Encounter (Signed)
Called [facility] office, they were closed for lunch but their answering nurse put in a phone message for me so we can verify that an appointment has been made. If they do not call back before office closes I will be calling at 1:00pm to obtain that info

## 2023-07-09 NOTE — Telephone Encounter (Signed)
Called again at lunch and there again was no one to speak with so I did provide number to family friend [name] so he may attempt to call the office and get information
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
## 2023-07-10 NOTE — Telephone Encounter (Signed)
Patient was in office on 07/09/2023 and was with family friend [name], we did discuss the results and I did provide a hard copy of both the blood results as well as imaging and discussed need to schedule follow up CT and [name] stated he will coordinate with Husband to ensure imaging is done and the results are shared

## 2023-07-11 ENCOUNTER — Encounter: Payer: Self-pay | Admitting: Family Medicine

## 2023-07-19 ENCOUNTER — Ambulatory Visit
Admission: RE | Admit: 2023-07-19 | Discharge: 2023-07-19 | Disposition: A | Discharge status: Home / Self Care | Payer: Medicare Other | Source: Ambulatory Visit | Attending: Family Medicine | Admitting: Family Medicine

## 2023-07-19 DIAGNOSIS — S2241XA Multiple fractures of ribs, right side, initial encounter for closed fracture: Secondary | ICD-10-CM | POA: Diagnosis not present

## 2023-07-19 DIAGNOSIS — S2242XA Multiple fractures of ribs, left side, initial encounter for closed fracture: Secondary | ICD-10-CM

## 2023-07-20 DIAGNOSIS — H353132 Nonexudative age-related macular degeneration, bilateral, intermediate dry stage: Secondary | ICD-10-CM | POA: Diagnosis not present

## 2023-07-20 DIAGNOSIS — Z556 Problems related to health literacy: Secondary | ICD-10-CM | POA: Diagnosis not present

## 2023-07-20 DIAGNOSIS — N183 Chronic kidney disease, stage 3 unspecified: Secondary | ICD-10-CM | POA: Diagnosis not present

## 2023-07-20 DIAGNOSIS — K5790 Diverticulosis of intestine, part unspecified, without perforation or abscess without bleeding: Secondary | ICD-10-CM | POA: Diagnosis not present

## 2023-07-20 DIAGNOSIS — E876 Hypokalemia: Secondary | ICD-10-CM | POA: Diagnosis not present

## 2023-07-20 DIAGNOSIS — W19XXXD Unspecified fall, subsequent encounter: Secondary | ICD-10-CM | POA: Diagnosis not present

## 2023-07-20 DIAGNOSIS — Z87891 Personal history of nicotine dependence: Secondary | ICD-10-CM | POA: Diagnosis not present

## 2023-07-20 DIAGNOSIS — I129 Hypertensive chronic kidney disease with stage 1 through stage 4 chronic kidney disease, or unspecified chronic kidney disease: Secondary | ICD-10-CM | POA: Diagnosis not present

## 2023-07-20 DIAGNOSIS — G4730 Sleep apnea, unspecified: Secondary | ICD-10-CM | POA: Diagnosis not present

## 2023-07-20 DIAGNOSIS — E785 Hyperlipidemia, unspecified: Secondary | ICD-10-CM | POA: Diagnosis not present

## 2023-07-20 DIAGNOSIS — M47812 Spondylosis without myelopathy or radiculopathy, cervical region: Secondary | ICD-10-CM | POA: Diagnosis not present

## 2023-07-20 DIAGNOSIS — G309 Alzheimer's disease, unspecified: Secondary | ICD-10-CM | POA: Diagnosis not present

## 2023-07-20 DIAGNOSIS — Z9181 History of falling: Secondary | ICD-10-CM | POA: Diagnosis not present

## 2023-07-20 DIAGNOSIS — E871 Hypo-osmolality and hyponatremia: Secondary | ICD-10-CM | POA: Diagnosis not present

## 2023-07-20 DIAGNOSIS — M1990 Unspecified osteoarthritis, unspecified site: Secondary | ICD-10-CM | POA: Diagnosis not present

## 2023-07-20 DIAGNOSIS — S2232XD Fracture of one rib, left side, subsequent encounter for fracture with routine healing: Secondary | ICD-10-CM | POA: Diagnosis not present

## 2023-07-20 DIAGNOSIS — M8580 Other specified disorders of bone density and structure, unspecified site: Secondary | ICD-10-CM | POA: Diagnosis not present

## 2023-07-23 DIAGNOSIS — H353132 Nonexudative age-related macular degeneration, bilateral, intermediate dry stage: Secondary | ICD-10-CM | POA: Diagnosis not present

## 2023-07-23 DIAGNOSIS — E871 Hypo-osmolality and hyponatremia: Secondary | ICD-10-CM | POA: Diagnosis not present

## 2023-07-23 DIAGNOSIS — W19XXXD Unspecified fall, subsequent encounter: Secondary | ICD-10-CM | POA: Diagnosis not present

## 2023-07-23 DIAGNOSIS — Z556 Problems related to health literacy: Secondary | ICD-10-CM | POA: Diagnosis not present

## 2023-07-23 DIAGNOSIS — G309 Alzheimer's disease, unspecified: Secondary | ICD-10-CM | POA: Diagnosis not present

## 2023-07-23 DIAGNOSIS — N183 Chronic kidney disease, stage 3 unspecified: Secondary | ICD-10-CM | POA: Diagnosis not present

## 2023-07-23 DIAGNOSIS — Z9181 History of falling: Secondary | ICD-10-CM | POA: Diagnosis not present

## 2023-07-23 DIAGNOSIS — M8580 Other specified disorders of bone density and structure, unspecified site: Secondary | ICD-10-CM | POA: Diagnosis not present

## 2023-07-23 DIAGNOSIS — S2232XD Fracture of one rib, left side, subsequent encounter for fracture with routine healing: Secondary | ICD-10-CM | POA: Diagnosis not present

## 2023-07-23 DIAGNOSIS — E785 Hyperlipidemia, unspecified: Secondary | ICD-10-CM | POA: Diagnosis not present

## 2023-07-23 DIAGNOSIS — Z87891 Personal history of nicotine dependence: Secondary | ICD-10-CM | POA: Diagnosis not present

## 2023-07-23 DIAGNOSIS — M47812 Spondylosis without myelopathy or radiculopathy, cervical region: Secondary | ICD-10-CM | POA: Diagnosis not present

## 2023-07-23 DIAGNOSIS — E876 Hypokalemia: Secondary | ICD-10-CM | POA: Diagnosis not present

## 2023-07-23 DIAGNOSIS — M1990 Unspecified osteoarthritis, unspecified site: Secondary | ICD-10-CM | POA: Diagnosis not present

## 2023-07-23 DIAGNOSIS — G4730 Sleep apnea, unspecified: Secondary | ICD-10-CM | POA: Diagnosis not present

## 2023-07-23 DIAGNOSIS — I129 Hypertensive chronic kidney disease with stage 1 through stage 4 chronic kidney disease, or unspecified chronic kidney disease: Secondary | ICD-10-CM | POA: Diagnosis not present

## 2023-07-23 DIAGNOSIS — K5790 Diverticulosis of intestine, part unspecified, without perforation or abscess without bleeding: Secondary | ICD-10-CM | POA: Diagnosis not present

## 2023-07-25 ENCOUNTER — Telehealth: Payer: Self-pay | Admitting: Family Medicine

## 2023-07-25 NOTE — Telephone Encounter (Signed)
Placed in your sign folder  

## 2023-07-25 NOTE — Telephone Encounter (Signed)
Placed in folder at nurse station

## 2023-07-25 NOTE — Telephone Encounter (Signed)
Paperwork completed and placed in fax bin at back nurse station

## 2023-07-25 NOTE — Telephone Encounter (Signed)
Home Health Certification or Plan of Care Tracking  Is this a Certification or Plan of Care? Plan of Care  [facility] Agency: [facility]  Order Number:  [id number]  Has charge sheet been attached? Yes  Where has form been placed:   Labeled & placed in provider bin

## 2023-07-25 NOTE — Telephone Encounter (Signed)
Home Health Certification or Plan of Care Tracking  Is this a Certification or Plan of Care? Plan of Care  [facility] Agency: [facility]  Order Number:  [id number]  Has charge sheet been attached? Yes  Where has form been placed:   Labeled & placed in provider bin

## 2023-07-26 ENCOUNTER — Telehealth: Payer: Self-pay | Admitting: Family Medicine

## 2023-07-26 DIAGNOSIS — I129 Hypertensive chronic kidney disease with stage 1 through stage 4 chronic kidney disease, or unspecified chronic kidney disease: Secondary | ICD-10-CM | POA: Diagnosis not present

## 2023-07-26 DIAGNOSIS — M8580 Other specified disorders of bone density and structure, unspecified site: Secondary | ICD-10-CM | POA: Diagnosis not present

## 2023-07-26 DIAGNOSIS — H353132 Nonexudative age-related macular degeneration, bilateral, intermediate dry stage: Secondary | ICD-10-CM | POA: Diagnosis not present

## 2023-07-26 DIAGNOSIS — S2232XD Fracture of one rib, left side, subsequent encounter for fracture with routine healing: Secondary | ICD-10-CM | POA: Diagnosis not present

## 2023-07-26 DIAGNOSIS — M47812 Spondylosis without myelopathy or radiculopathy, cervical region: Secondary | ICD-10-CM | POA: Diagnosis not present

## 2023-07-26 DIAGNOSIS — E785 Hyperlipidemia, unspecified: Secondary | ICD-10-CM | POA: Diagnosis not present

## 2023-07-26 DIAGNOSIS — W19XXXD Unspecified fall, subsequent encounter: Secondary | ICD-10-CM | POA: Diagnosis not present

## 2023-07-26 DIAGNOSIS — G4730 Sleep apnea, unspecified: Secondary | ICD-10-CM | POA: Diagnosis not present

## 2023-07-26 DIAGNOSIS — N183 Chronic kidney disease, stage 3 unspecified: Secondary | ICD-10-CM | POA: Diagnosis not present

## 2023-07-26 DIAGNOSIS — G309 Alzheimer's disease, unspecified: Secondary | ICD-10-CM | POA: Diagnosis not present

## 2023-07-26 DIAGNOSIS — Z87891 Personal history of nicotine dependence: Secondary | ICD-10-CM | POA: Diagnosis not present

## 2023-07-26 DIAGNOSIS — E876 Hypokalemia: Secondary | ICD-10-CM | POA: Diagnosis not present

## 2023-07-26 DIAGNOSIS — Z9181 History of falling: Secondary | ICD-10-CM | POA: Diagnosis not present

## 2023-07-26 DIAGNOSIS — K5790 Diverticulosis of intestine, part unspecified, without perforation or abscess without bleeding: Secondary | ICD-10-CM | POA: Diagnosis not present

## 2023-07-26 DIAGNOSIS — M1990 Unspecified osteoarthritis, unspecified site: Secondary | ICD-10-CM | POA: Diagnosis not present

## 2023-07-26 DIAGNOSIS — E871 Hypo-osmolality and hyponatremia: Secondary | ICD-10-CM | POA: Diagnosis not present

## 2023-07-26 DIAGNOSIS — Z556 Problems related to health literacy: Secondary | ICD-10-CM | POA: Diagnosis not present

## 2023-07-26 NOTE — Telephone Encounter (Signed)
Home Health Certification or Plan of Care Tracking  Is this a Certification or Plan of Care? Plan of Care  [facility] Agency: [facility]  Order Number:  N/[id number]  Has charge sheet been attached? Yes  Where has form been placed:   Labeled & placed in provider bin

## 2023-07-29 ENCOUNTER — Telehealth: Payer: Self-pay | Admitting: Family Medicine

## 2023-07-29 NOTE — Telephone Encounter (Signed)
Home Health Certification or Plan of Care Tracking  Is this a Certification or Plan of Care? Plan of Care  [facility] Agency: [facility]  Order Number:  [id number]  Has charge sheet been attached? Yes  Where has form been placed:   Labeled & placed in provider bin

## 2023-07-29 NOTE — Telephone Encounter (Signed)
Placed in your sign folder at nurse station.

## 2023-07-29 NOTE — Telephone Encounter (Signed)
Paperwork completed and placed in fax bin at back nurse station

## 2023-07-29 NOTE — Telephone Encounter (Signed)
Placed in your to be signed folder at the nurse station

## 2023-07-30 NOTE — Telephone Encounter (Signed)
Faxed back to [facility] at their requested fax

## 2023-08-02 ENCOUNTER — Telehealth: Payer: Self-pay | Admitting: Family Medicine

## 2023-08-02 ENCOUNTER — Telehealth: Payer: Self-pay

## 2023-08-02 DIAGNOSIS — I129 Hypertensive chronic kidney disease with stage 1 through stage 4 chronic kidney disease, or unspecified chronic kidney disease: Secondary | ICD-10-CM | POA: Diagnosis not present

## 2023-08-02 DIAGNOSIS — E785 Hyperlipidemia, unspecified: Secondary | ICD-10-CM | POA: Diagnosis not present

## 2023-08-02 DIAGNOSIS — M1990 Unspecified osteoarthritis, unspecified site: Secondary | ICD-10-CM | POA: Diagnosis not present

## 2023-08-02 DIAGNOSIS — M47812 Spondylosis without myelopathy or radiculopathy, cervical region: Secondary | ICD-10-CM | POA: Diagnosis not present

## 2023-08-02 DIAGNOSIS — Z9181 History of falling: Secondary | ICD-10-CM | POA: Diagnosis not present

## 2023-08-02 DIAGNOSIS — H353132 Nonexudative age-related macular degeneration, bilateral, intermediate dry stage: Secondary | ICD-10-CM | POA: Diagnosis not present

## 2023-08-02 DIAGNOSIS — S2232XD Fracture of one rib, left side, subsequent encounter for fracture with routine healing: Secondary | ICD-10-CM | POA: Diagnosis not present

## 2023-08-02 DIAGNOSIS — E876 Hypokalemia: Secondary | ICD-10-CM | POA: Diagnosis not present

## 2023-08-02 DIAGNOSIS — K5790 Diverticulosis of intestine, part unspecified, without perforation or abscess without bleeding: Secondary | ICD-10-CM | POA: Diagnosis not present

## 2023-08-02 DIAGNOSIS — G4730 Sleep apnea, unspecified: Secondary | ICD-10-CM | POA: Diagnosis not present

## 2023-08-02 DIAGNOSIS — N183 Chronic kidney disease, stage 3 unspecified: Secondary | ICD-10-CM | POA: Diagnosis not present

## 2023-08-02 DIAGNOSIS — Z87891 Personal history of nicotine dependence: Secondary | ICD-10-CM | POA: Diagnosis not present

## 2023-08-02 DIAGNOSIS — Z556 Problems related to health literacy: Secondary | ICD-10-CM | POA: Diagnosis not present

## 2023-08-02 DIAGNOSIS — G309 Alzheimer's disease, unspecified: Secondary | ICD-10-CM | POA: Diagnosis not present

## 2023-08-02 DIAGNOSIS — M8580 Other specified disorders of bone density and structure, unspecified site: Secondary | ICD-10-CM | POA: Diagnosis not present

## 2023-08-02 DIAGNOSIS — E871 Hypo-osmolality and hyponatremia: Secondary | ICD-10-CM | POA: Diagnosis not present

## 2023-08-02 DIAGNOSIS — W19XXXD Unspecified fall, subsequent encounter: Secondary | ICD-10-CM | POA: Diagnosis not present

## 2023-08-02 NOTE — Telephone Encounter (Signed)
 Home Health Verbal Orders  Agency: [facility]    Caller: Tawny Hopping  Call back #: [phone] Fax [phone]    Requesting OT:    Reason for Request:    Frequency:    Placed in [employer] Sheet Attached

## 2023-08-02 NOTE — Telephone Encounter (Signed)
 Okay for verbal orders.

## 2023-08-02 NOTE — Telephone Encounter (Signed)
 Yes, agree with verbal orders for occupational therapy and social worker.  She had a previous shoulder x-ray on November 27.  Left hand x-ray has been ordered but not yet completed.  However I have referred her to orthopedics and would defer any treatment or evaluation of left extremity to after orthopedic evaluation.  Okay to complete OT for other areas.

## 2023-08-02 NOTE — Telephone Encounter (Signed)
 Copied from CRM [id number]. Topic: Clinical - Medical Advice >> Aug 02, 2023 11:42 AM Joen NOVAK wrote: Reason for CRM: [facility] CALLED NEEDING VERBAL ORDERS ON THIS PATIENT. MELISSA WILLEFO STATED SHE NEED VERBAL ORDERS FOR OCCUPATIONAL THERAPY 1 TIME PER WEEK FOR 6 WEEKS. SHE NEEDS A ADD ON FOR A MEDICAL SOCIAL WORKER. SHE IS ALSO REQUESTING X RAY OF LEFT HAND FINGERS WRIST AND SHOULDER BEFORE SHE CAN TREAT THE PATIENT. HER CONTACT PHONE # [phone]

## 2023-08-02 NOTE — Telephone Encounter (Signed)
 Provided verbal and they will be sending a fax to sign off on.

## 2023-08-02 NOTE — Telephone Encounter (Signed)
 Placed in your sign folder

## 2023-08-06 ENCOUNTER — Telehealth: Payer: Self-pay

## 2023-08-06 NOTE — Telephone Encounter (Signed)
-----   Message from Reyes JONELLE Pines sent at 08/06/2023  9:51 AM EST ----- Call patient, spouse or family friend to discuss results. CT scan did verify multiple rib fractures, some that may be older and healing.  There were some possible signs of interstitial lung disease.  We can discuss this at next visit unless any new cough, or other respiratory symptoms.  Let me know if there are questions

## 2023-08-08 ENCOUNTER — Ambulatory Visit: Payer: Medicare Other | Admitting: Family Medicine

## 2023-08-08 ENCOUNTER — Telehealth: Payer: Self-pay | Admitting: Family Medicine

## 2023-08-08 ENCOUNTER — Encounter: Payer: Self-pay | Admitting: Family Medicine

## 2023-08-08 VITALS — BP 120/66 | HR 69 | Temp 98.0°F | Ht 61.0 in

## 2023-08-08 DIAGNOSIS — R7989 Other specified abnormal findings of blood chemistry: Secondary | ICD-10-CM

## 2023-08-08 DIAGNOSIS — R946 Abnormal results of thyroid function studies: Secondary | ICD-10-CM | POA: Diagnosis not present

## 2023-08-08 DIAGNOSIS — G309 Alzheimer's disease, unspecified: Secondary | ICD-10-CM | POA: Diagnosis not present

## 2023-08-08 DIAGNOSIS — S2242XD Multiple fractures of ribs, left side, subsequent encounter for fracture with routine healing: Secondary | ICD-10-CM

## 2023-08-08 DIAGNOSIS — R29898 Other symptoms and signs involving the musculoskeletal system: Secondary | ICD-10-CM

## 2023-08-08 DIAGNOSIS — R296 Repeated falls: Secondary | ICD-10-CM | POA: Diagnosis not present

## 2023-08-08 DIAGNOSIS — E876 Hypokalemia: Secondary | ICD-10-CM | POA: Diagnosis not present

## 2023-08-08 DIAGNOSIS — F0280 Dementia in other diseases classified elsewhere without behavioral disturbance: Secondary | ICD-10-CM

## 2023-08-08 DIAGNOSIS — M79642 Pain in left hand: Secondary | ICD-10-CM

## 2023-08-08 DIAGNOSIS — R918 Other nonspecific abnormal finding of lung field: Secondary | ICD-10-CM | POA: Diagnosis not present

## 2023-08-08 NOTE — Patient Instructions (Addendum)
 There were multiple rib fractures seen on CT scan (2nd-7th on left, 5th through 9th on right, left 7th rib looks like most recent). Continue to use walker and home therapy should help prevent falls. See other info below.   There were signs of possible interstitial lung disease.  Since no cough or new lung symptoms at this time, ok to wait on pulmonary eval for now, but I would recommend that at some point to decide if other testing needed.   Hand xray today if possible. We will check into the shoulder specialist appointment and call you with their information once we know more.  [facility] memory specialist did try to call multiple times in December and had some difficulty getting in contact with you. Here is their number to call and schedule appointment with Camie Sevin.  Please call them as soon as possible to get that appointment scheduled:  [facility], [facility] [address], [address] [place], [place] [address] [phone]   Fall Prevention in the Home, Adult Falls can cause injuries and affect people of all ages. There are many simple things that you can do to make your home safe and to help prevent falls. If you need it, ask for help making these changes. What actions can I take to prevent falls? General information Use good lighting in all rooms. Make sure to: Replace any light bulbs that burn out. Turn on lights if it is dark and use night-lights. Keep items that you use often in easy-to-reach places. Lower the shelves around your home if needed. Move furniture so that there are clear paths around it. Do not keep throw rugs or other things on the floor that can make you trip. If any of your floors are uneven, fix them. Add color or contrast paint or tape to clearly mark and help you see: Grab bars or handrails. First and last steps of staircases. Where the edge of each step is. If you use a ladder or stepladder: Make sure that it is fully opened. Do not climb  a closed ladder. Make sure the sides of the ladder are locked in place. Have someone hold the ladder while you use it. Know where your pets are as you move through your home. What can I do in the bathroom?     Keep the floor dry. Clean up any water that is on the floor right away. Remove soap buildup in the bathtub or shower. Buildup makes bathtubs and showers slippery. Use non-skid mats or decals on the floor of the bathtub or shower. Attach bath mats securely with double-sided, non-slip rug tape. If you need to sit down while you are in the shower, use a non-slip stool. Install grab bars by the toilet and in the bathtub and shower. Do not use towel bars as grab bars. What can I do in the bedroom? Make sure that you have a light by your bed that is easy to reach. Do not use any sheets or blankets on your bed that hang to the floor. Have a firm bench or chair with side arms that you can use for support when you get dressed. What can I do in the kitchen? Clean up any spills right away. If you need to reach something above you, use a sturdy step stool that has a grab bar. Keep electrical cables out of the way. Do not use floor polish or wax that makes floors slippery. What can I do with my stairs? Do not leave  anything on the stairs. Make sure that you have a light switch at the top and the bottom of the stairs. Have them installed if you do not have them. Make sure that there are handrails on both sides of the stairs. Fix handrails that are broken or loose. Make sure that handrails are as long as the staircases. Install non-slip stair treads on all stairs in your home if they do not have carpet. Avoid having throw rugs at the top or bottom of stairs, or secure the rugs with carpet tape to prevent them from moving. Choose a carpet design that does not hide the edge of steps on the stairs. Make sure that carpet is firmly attached to the stairs. Fix any carpet that is loose or worn. What  can I do on the outside of my home? Use bright outdoor lighting. Repair the edges of walkways and driveways and fix any cracks. Clear paths of anything that can make you trip, such as tools or rocks. Add color or contrast paint or tape to clearly mark and help you see high doorway thresholds. Trim any bushes or trees on the main path into your home. Check that handrails are securely fastened and in good repair. Both sides of all steps should have handrails. Install guardrails along the edges of any raised decks or porches. Have leaves, snow, and ice cleared regularly. Use sand, salt, or ice melt on walkways during winter months if you live where there is ice and snow. In the garage, clean up any spills right away, including grease or oil spills. What other actions can I take? Review your medicines with your health care provider. Some medicines can make you confused or feel dizzy. This can increase your chance of falling. Wear closed-toe shoes that fit well and support your feet. Wear shoes that have rubber soles and low heels. Use a cane, walker, scooter, or crutches that help you move around if needed. Talk with your provider about other ways that you can decrease your risk of falls. This may include seeing a physical therapist to learn to do exercises to improve movement and strength. Where to find more information Centers for Disease Control and Prevention, [employer]: tonerpromos.no [employer] on Aging: baseringtones.pl National Institute on Aging: baseringtones.pl Contact a health care provider if: You are afraid of falling at home. You feel weak, drowsy, or dizzy at home. You fall at home. Get help right away if you: Lose consciousness or have trouble moving after a fall. Have a fall that causes a head injury. These symptoms may be an emergency. Get help right away. Call 911. Do not wait to see if the symptoms will go away. Do not drive yourself to the hospital. This information is not intended  to replace advice given to you by your health care provider. Make sure you discuss any questions you have with your health care provider. Document Revised: 03/19/2022 Document Reviewed: 03/19/2022 Elsevier Patient Education  2024 [employer].

## 2023-08-08 NOTE — Telephone Encounter (Signed)
 Placed in your sign folder

## 2023-08-08 NOTE — Progress Notes (Signed)
 Subjective:  Patient ID: Allison Chavez, female    DOB: February 12, 1941  Age: 83 y.o. MRN: [id number]  CC:  Chief Complaint  Patient presents with   Fall    Pt here to do fall check in notes has not fallen since last visit    Arthritis    FYI pt takes 2 tylenol  in am and pm with water to help her get through this pain     HPI Allison Chavez presents for follow up. Here with Phil.   History of falls See prior visit.Rib fractures on x-ray with follow-up CT indicating multiple rib fractures, some subacute.   Pain had been controlled with Tylenol  only.  Home health referral previously placed and now physical therapy, Occupational Therapy, home health involved. eval last week - some exercises given for lower body.  No falls in past 2 weeks. No new areas of pain.  Happy with eval by home health. 4 separate evaluations to this time.  Using walker at home. Denies other home needs at this time.   Abnormal CT As above, multiple rib fractures were noted on recent CT, but also some signs of possible interstitial lung disease.  No cough. Denies dyspnea. Plan to continue monitor at this time - declined pulmonary   Left shoulder pain with decreased motion See prior notes, shoulder x-ray indicated chronic findings of deformity of distal left clavicle with old fracture and deformity of proximal left humeral head consistent with old fracture but no acute fracture or dislocation.  Referred back to orthopedics to evaluate for treatment options given progressive difficulty with motion. Unknown if call received from ortho.  Shoulder about the same.   Decreased hand range of motion See last visit, hand held in flexion.  Difficulty with extension of hand.  Ortho eval as above for shoulder, x-ray of hand was ordered but not completed. Still has not had time for Xray - still having pain in hand. Taking tylenol  2 BID past few days. Recommended by home health. Massages hand to help at times.  Still keeping 3rd -4th  fingers flexed.  See photo below.   Dementia See prior notes.  Previously had been on Celexa , Namenda , and then lost to follow-up with neurology since May 2023.  Increased falls, instability as above.  Recommended use of walker at home, home health PT/OT as above.  Family friend Gerlene has been present for recent visits.  Cautious gait past few months with history of falls.  They had lowered their bed to make it easier at home as of her last visit.  Referred back to neurology.  CT head without acute CVA, age-related cerebral atrophy and likely sequela of chronic small vessel ischemic disease.  Borderline TSH but normal B12.  Borderline potassium on December labs.  Had not seen neurology as of last visit, phone number provided to coordinate that appointment. Patient/family/friend have not called for that appointment. Hectic with holidays. Phil's schedule was time prohibitive. More time now.   Lab Results  Component Value Date   TSH 5.67 (H) 07/03/2023   Lab Results  Component Value Date   NA 140 07/03/2023   K 3.4 (L) 07/03/2023   CL 106 07/03/2023   CO2 27 07/03/2023    History Patient Active Problem List   Diagnosis Date Noted   Generalized anxiety disorder 09/18/2022   Major depressive disorder 09/18/2022   DJD (degenerative joint disease) of cervical spine 09/18/2022   Dementia due to Alzheimer's disease 12/19/2021   Senile entropion  of right lower eyelid 06/07/2020   Intermediate stage nonexudative age-related macular degeneration of both eyes 12/08/2019   Vitreous prolapse of left eye 12/08/2019   Degenerative retinal drusen of left eye 12/08/2019   Degenerative retinal drusen of right eye 12/08/2019   Pseudophakia of both eyes 12/08/2019   CKD (chronic kidney disease), stage III 12/25/2017   Hyponatremia 12/25/2017   Hypokalemia 12/25/2017   Alcohol abuse 12/25/2017   Sigmoid diverticulosis 01/15/2017   Osteopenia 01/11/2015   Hyperlipidemia 01/11/2015   Insomnia 10/08/2013    HTN (hypertension) 03/07/2012   Past Medical History:  Diagnosis Date   Alcohol abuse 12/25/2017   Cataract    CKD (chronic kidney disease), stage III 12/25/2017   Degenerative retinal drusen of left eye 12/08/2019   Degenerative retinal drusen of right eye 12/08/2019   Dementia due to Alzheimer's disease 12/19/2021   DJD (degenerative joint disease) of cervical spine    DJD of shoulder, right    Generalized anxiety disorder    HTN (hypertension) 03/07/2012   Hyperlipidemia 01/11/2015   Hypokalemia 12/25/2017   Hyponatremia 12/25/2017   Insomnia 10/08/2013   Intermediate stage nonexudative age-related macular degeneration of both eyes 12/08/2019   Major depressive disorder    Osteopenia 01/11/2015   Updated DEXA 01/17/2015. Osteopenia. Stable since 11/24/2002.   Pneumonia    as a child   Pseudophakia of both eyes 12/08/2019   Renal insufficiency, mild    Senile entropion of right lower eyelid 06/07/2020   Sigmoid diverticulosis 01/08/2017   Noted on colonoscopy   Vitreous prolapse of left eye 12/08/2019   Past Surgical History:  Procedure Laterality Date   CLAVICLE SURGERY Right    from [facility]   COLONOSCOPY     EYE SURGERY Bilateral    cataract surgery with lens implants   HARDWARE REMOVAL Left 12/09/2014   Procedure: LEFT SHOULDER HARDWARE REMOVAL;  Surgeon: Franky Pointer, MD;  Location: [facility] OR;  Service: Orthopedics;  Laterality: Left;   ORIF SHOULDER FRACTURE Left 09/23/2014   Procedure: OPEN REDUCTION INTERNAL FIXATION (ORIF) SHOULDER FRACTURE;  Surgeon: Franky CHRISTELLA Pointer, MD;  Location: [facility] OR;  Service: Orthopedics;  Laterality: Left;   TONSILLECTOMY     adenoids- childhood   Allergies  Allergen Reactions   Ace Inhibitors Cough    Cough    Sulfa Antibiotics Hives   Wellbutrin [Bupropion] Rash    Only with generic, immediate release   Prior to Admission medications   Medication Sig Start Date End Date Taking? Authorizing Provider  citalopram  (CELEXA ) 20 MG tablet  Take 1 tablet (20 mg total) by mouth daily. 10/02/21  Yes Levora Reyes SAUNDERS, MD  folic acid  (FOLVITE ) 1 MG tablet Take 1 tablet (1 mg total) by mouth daily. 12/28/17  Yes Jerri Keys, MD  hydrOXYzine  (ATARAX ) 25 MG tablet Take 0.5 tablets (12.5 mg total) by mouth at bedtime as needed for anxiety. 12/05/21  Yes Levora Reyes SAUNDERS, MD  memantine  (NAMENDA ) 10 MG tablet Take 1 tablet (10 mg at night) for 2 weeks, then increase to 1 tablet (10 mg) twice a day 12/19/21  Yes Wertman, Sara E, PA-C  Multiple Vitamins-Minerals (PRESERVISION AREDS 2+MULTI VIT) CAPS Take by mouth daily.   Yes [provider]  Multiple Vitamins-Minerals (WOMENS ONE DAILY) TABS Take 1 tablet by mouth 1 day or 1 dose.   Yes [provider]  thiamine  100 MG tablet Take 1 tablet (100 mg total) by mouth daily. 12/28/17  Yes Jerri Keys, MD   Social History  Socioeconomic History   Marital status: Married    Spouse name: Sharolyn   Number of children: 3   Years of education: College   Highest education level: Bachelor's degree (e.g., BA, AB, BS)  Occupational History   Occupation: engineer, agricultural  Tobacco Use   Smoking status: Former    Current packs/day: 0.00    Average packs/day: 0.3 packs/day for 45.0 years (13.5 ttl pk-yrs)    Types: Cigarettes    Start date: 11/29/1956    Quit date: 11/29/2001    Years since quitting: 21.7   Smokeless tobacco: Never   Tobacco comments:    quit age 56  Vaping Use   Vaping status: Never Used  Substance and Sexual Activity   Alcohol use: Yes    Comment: special occasions   Drug use: No   Sexual activity: Never    Partners: Male    Birth control/protection: Post-menopausal  Other Topics Concern   Not on file  Social History Narrative   Has not lived with husband (3rd marriage), Sharolyn, for many years, though they maintain an intermittently sexual relationship. They talk almost daily.  He lives with his female partner.    Exercise- walks every day, for about a mile on average   Diet-  Makes healthy choices and drinks a lot of water.    Sleep- 8 hours a night   Caffeine- coffee, 1/2 cup daily   Social Drivers of Health   Financial Resource Strain: Low Risk  (12/13/2022)   Overall Financial Resource Strain (CARDIA)    Difficulty of Paying Living Expenses: Not hard at all  Food Insecurity: No Food Insecurity (12/13/2022)   Hunger Vital Sign    Worried About Running Out of Food in the Last Year: Never true    Ran Out of Food in the Last Year: Never true  Transportation Needs: No Transportation Needs (12/13/2022)   PRAPARE - Administrator, Civil Service (Medical): No    Lack of Transportation (Non-Medical): No  Physical Activity: Insufficiently Active (12/13/2022)   Exercise Vital Sign    Days of Exercise per Week: 4 days    Minutes of Exercise per Session: 30 min  Stress: No Stress Concern Present (12/13/2022)   [employer] of Occupational Health - Occupational Stress Questionnaire    Feeling of Stress : Not at all  Social Connections: Socially Integrated (12/13/2022)   Social Connection and Isolation Panel [NHANES]    Frequency of Communication with Friends and Family: More than three times a week    Frequency of Social Gatherings with Friends and Family: Once a week    Attends Religious Services: More than 4 times per year    Active Member of [employer] or Organizations: Yes    Attends Banker Meetings: 1 to 4 times per year    Marital Status: Married  Catering Manager Violence: Not At Risk (12/13/2022)   Humiliation, Afraid, Rape, and Kick questionnaire    Fear of Current or Ex-Partner: No    Emotionally Abused: No    Physically Abused: No    Sexually Abused: No    Review of Systems   Objective:   Vitals:   08/08/23 1326  BP: 120/66  Pulse: 69  Temp: 98 F (36.7 C)  TempSrc: Temporal  SpO2: 99%  Height: 5' 1 (1.549 m)     Physical Exam Vitals reviewed.  Constitutional:      Appearance: Normal appearance. She is  well-developed.  HENT:     Head:  Normocephalic and atraumatic.  Eyes:     Conjunctiva/sclera: Conjunctivae normal.     Pupils: Pupils are equal, round, and reactive to light.  Neck:     Vascular: No carotid bruit.  Cardiovascular:     Rate and Rhythm: Normal rate and regular rhythm.     Heart sounds: Normal heart sounds.  Pulmonary:     Effort: Pulmonary effort is normal.     Breath sounds: Normal breath sounds. No stridor. No wheezing, rhonchi or rales.  Abdominal:     Palpations: Abdomen is soft. There is no pulsatile mass.     Tenderness: There is no abdominal tenderness.  Musculoskeletal:     Right lower leg: No edema.     Left lower leg: No edema.     Comments: Guarded with use of left shoulder, left arm, held in slight flexion at elbow.  Right hand with third and fourth fingers held in flexion at the MCP, PIP greater than DIP.  Second and fifth with some increased PIP extension but still limited, less than third and fourth.  See photo.  Skin:    General: Skin is warm and dry.  Neurological:     Mental Status: She is alert and oriented to person, place, and time.  Psychiatric:        Mood and Affect: Mood normal.        Behavior: Behavior normal.         Assessment & Plan:  ELSY CHIANG is a 83 y.o. female . Recurrent falls Closed fracture of multiple ribs of left side with routine healing, subsequent encounter  -Denies falls since last visit, now with home health in place, including OT, PT, other home assistance.  Encouraged continued use of walker and fall precautions, handout provided.  Multiple rib fractures noted, likely from previous falls.  Denies any rib pain, shortness of breath or cough at this time.  Continue PT, OT, fall precautions at home with RTC/ER precautions given.  Hold on new meds at this time until discussed with neurology.  Left arm weakness Left hand pain  -Possible contractures of left hand.  Has complained of discomfort, stressed importance of  x-ray to rule out underlying injury/fracture with previous falls.  They plan to have that performed today after office visit.  -No apparent calls for Ortho eval, will have staff check into status of referral and provide contact number for patient, friend to coordinate that appointment.  Dementia due to Alzheimer's disease  -Stressed importance of follow-up with memory care specialist.  Phone number provided again coordinate that care as that office had attempted multiple calls prior to her December visit.  Elevated TSH - Plan: TSH -Mild elevation previously, repeat TSH and adjust plan accordingly  Hypokalemia - Plan: Basic metabolic panel -Borderline previously, check labs and adjust plan accordingly  Abnormal finding on lung imaging Possible interstitial lung disease on imaging, denies cough, dyspnea or new pulmonary symptoms.  Option to meet with pulmonary for further testing, declined at this time.  Continue to monitor.  No orders of the defined types were placed in this encounter.  Patient Instructions  There were multiple rib fractures seen on CT scan (2nd-7th on left, 5th through 9th on right, left 7th rib looks like most recent). Continue to use walker and home therapy should help prevent falls. See other info below.   There were signs of possible interstitial lung disease.  Since no cough or new lung symptoms at this time, ok to wait on  pulmonary eval for now, but I would recommend that at some point to decide if other testing needed.   Hand xray today if possible. We will check into the shoulder specialist appointment and call you with their information once we know more.  [facility] memory specialist did try to call multiple times in December and had some difficulty getting in contact with you. Here is their number to call and schedule appointment with Camie Sevin.  Please call them as soon as possible to get that appointment scheduled:  [facility] [address] [place], [place] [address] [phone]   Fall Prevention in the Home, Adult Falls can cause injuries and affect people of all ages. There are many simple things that you can do to make your home safe and to help prevent falls. If you need it, ask for help making these changes. What actions can I take to prevent falls? General information Use good lighting in all rooms. Make sure to: Replace any light bulbs that burn out. Turn on lights if it is dark and use night-lights. Keep items that you use often in easy-to-reach places. Lower the shelves around your home if needed. Move furniture so that there are clear paths around it. Do not keep throw rugs or other things on the floor that can make you trip. If any of your floors are uneven, fix them. Add color or contrast paint or tape to clearly mark and help you see: Grab bars or handrails. First and last steps of staircases. Where the edge of each step is. If you use a ladder or stepladder: Make sure that it is fully opened. Do not climb a closed ladder. Make sure the sides of the ladder are locked in place. Have someone hold the ladder while you use it. Know where your pets are as you move through your home. What can I do in the bathroom?     Keep the floor dry. Clean up any water that is on the floor right away. Remove soap buildup in the bathtub or shower. Buildup makes bathtubs and showers slippery. Use non-skid mats or decals on the floor of the bathtub or shower. Attach bath mats securely with double-sided, non-slip rug tape. If you need to sit down while you are in the shower, use a non-slip stool. Install grab bars by the toilet and in the bathtub and shower. Do not use towel bars as grab bars. What can I do in the bedroom? Make sure that you have a light by your bed that is easy to reach. Do not use any sheets or blankets on your bed that hang to the floor. Have a firm bench or chair with side arms  that you can use for support when you get dressed. What can I do in the kitchen? Clean up any spills right away. If you need to reach something above you, use a sturdy step stool that has a grab bar. Keep electrical cables out of the way. Do not use floor polish or wax that makes floors slippery. What can I do with my stairs? Do not leave anything on the stairs. Make sure that you have a light switch at the top and the bottom of the stairs. Have them installed if you do not have them. Make sure that there are handrails on both sides of the stairs. Fix handrails that are broken or loose. Make sure that handrails are as long as the staircases. Install non-slip stair treads  on all stairs in your home if they do not have carpet. Avoid having throw rugs at the top or bottom of stairs, or secure the rugs with carpet tape to prevent them from moving. Choose a carpet design that does not hide the edge of steps on the stairs. Make sure that carpet is firmly attached to the stairs. Fix any carpet that is loose or worn. What can I do on the outside of my home? Use bright outdoor lighting. Repair the edges of walkways and driveways and fix any cracks. Clear paths of anything that can make you trip, such as tools or rocks. Add color or contrast paint or tape to clearly mark and help you see high doorway thresholds. Trim any bushes or trees on the main path into your home. Check that handrails are securely fastened and in good repair. Both sides of all steps should have handrails. Install guardrails along the edges of any raised decks or porches. Have leaves, snow, and ice cleared regularly. Use sand, salt, or ice melt on walkways during winter months if you live where there is ice and snow. In the garage, clean up any spills right away, including grease or oil spills. What other actions can I take? Review your medicines with your health care provider. Some medicines can make you confused or feel dizzy.  This can increase your chance of falling. Wear closed-toe shoes that fit well and support your feet. Wear shoes that have rubber soles and low heels. Use a cane, walker, scooter, or crutches that help you move around if needed. Talk with your provider about other ways that you can decrease your risk of falls. This may include seeing a physical therapist to learn to do exercises to improve movement and strength. Where to find more information Centers for Disease Control and Prevention, STEADI: tonerpromos.no [employer] on Aging: baseringtones.pl National Institute on Aging: baseringtones.pl Contact a health care provider if: You are afraid of falling at home. You feel weak, drowsy, or dizzy at home. You fall at home. Get help right away if you: Lose consciousness or have trouble moving after a fall. Have a fall that causes a head injury. These symptoms may be an emergency. Get help right away. Call 911. Do not wait to see if the symptoms will go away. Do not drive yourself to the hospital. This information is not intended to replace advice given to you by your health care provider. Make sure you discuss any questions you have with your health care provider. Document Revised: 03/19/2022 Document Reviewed: 03/19/2022 Elsevier Patient Education  2024 Elsevier Inc.     Signed,   Reyes Pines, MD [facility], [facility] 08/08/23 2:39 PM

## 2023-08-08 NOTE — Telephone Encounter (Signed)
 Type of form received:[employer]   Additional comments: Missed Visit   Received by: Shanda GLENWOOD Riggs Desk   Form should be Faxed/mailed to: N/A  Is patient requesting call for pickup: N/A  Form placed: [employer] charge sheet.  Provider will determine charge. N/A  Individual made aware of 3-5 business day turn around No?

## 2023-08-09 ENCOUNTER — Telehealth: Payer: Self-pay | Admitting: Family Medicine

## 2023-08-09 LAB — BASIC METABOLIC PANEL
BUN: 33 mg/dL — ABNORMAL HIGH (ref 6–23)
CO2: 24 meq/L (ref 19–32)
Calcium: 9.2 mg/dL (ref 8.4–10.5)
Chloride: 106 meq/L (ref 96–112)
Creatinine, Ser: 1.42 mg/dL — ABNORMAL HIGH (ref 0.40–1.20)
GFR: 34.88 mL/min — ABNORMAL LOW (ref >60.00)
Glucose, Bld: 90 mg/dL (ref 70–99)
Potassium: 5 meq/L (ref 3.5–5.1)
Sodium: 139 meq/L (ref 135–145)

## 2023-08-09 LAB — TSH: TSH: 8.53 u[IU]/mL — ABNORMAL HIGH (ref 0.35–5.50)

## 2023-08-09 NOTE — Telephone Encounter (Signed)
 Placed in your review folder at nurse station

## 2023-08-09 NOTE — Telephone Encounter (Signed)
 Type of form received:[employer] [facility]   Additional comments: Missed Visit  Received ab:[id number] -Front Desk   Form should be Faxed/mailed to: N/A  Is patient requesting call for pickup:N/A  Form placed: [employer] charge sheet.  Provider will determine charge. N/A  Individual made aware of 3-5 business day turn around No?  FYI

## 2023-08-12 ENCOUNTER — Telehealth: Payer: Self-pay

## 2023-08-12 DIAGNOSIS — S2232XD Fracture of one rib, left side, subsequent encounter for fracture with routine healing: Secondary | ICD-10-CM | POA: Diagnosis not present

## 2023-08-12 DIAGNOSIS — H353132 Nonexudative age-related macular degeneration, bilateral, intermediate dry stage: Secondary | ICD-10-CM | POA: Diagnosis not present

## 2023-08-12 DIAGNOSIS — M8580 Other specified disorders of bone density and structure, unspecified site: Secondary | ICD-10-CM | POA: Diagnosis not present

## 2023-08-12 DIAGNOSIS — Z9181 History of falling: Secondary | ICD-10-CM | POA: Diagnosis not present

## 2023-08-12 DIAGNOSIS — G4730 Sleep apnea, unspecified: Secondary | ICD-10-CM | POA: Diagnosis not present

## 2023-08-12 DIAGNOSIS — W19XXXD Unspecified fall, subsequent encounter: Secondary | ICD-10-CM | POA: Diagnosis not present

## 2023-08-12 DIAGNOSIS — Z87891 Personal history of nicotine dependence: Secondary | ICD-10-CM | POA: Diagnosis not present

## 2023-08-12 DIAGNOSIS — M47812 Spondylosis without myelopathy or radiculopathy, cervical region: Secondary | ICD-10-CM | POA: Diagnosis not present

## 2023-08-12 DIAGNOSIS — I129 Hypertensive chronic kidney disease with stage 1 through stage 4 chronic kidney disease, or unspecified chronic kidney disease: Secondary | ICD-10-CM | POA: Diagnosis not present

## 2023-08-12 DIAGNOSIS — E876 Hypokalemia: Secondary | ICD-10-CM | POA: Diagnosis not present

## 2023-08-12 DIAGNOSIS — M1990 Unspecified osteoarthritis, unspecified site: Secondary | ICD-10-CM | POA: Diagnosis not present

## 2023-08-12 DIAGNOSIS — E871 Hypo-osmolality and hyponatremia: Secondary | ICD-10-CM | POA: Diagnosis not present

## 2023-08-12 DIAGNOSIS — G309 Alzheimer's disease, unspecified: Secondary | ICD-10-CM | POA: Diagnosis not present

## 2023-08-12 DIAGNOSIS — K5790 Diverticulosis of intestine, part unspecified, without perforation or abscess without bleeding: Secondary | ICD-10-CM | POA: Diagnosis not present

## 2023-08-12 DIAGNOSIS — Z556 Problems related to health literacy: Secondary | ICD-10-CM | POA: Diagnosis not present

## 2023-08-12 DIAGNOSIS — E785 Hyperlipidemia, unspecified: Secondary | ICD-10-CM | POA: Diagnosis not present

## 2023-08-12 DIAGNOSIS — N183 Chronic kidney disease, stage 3 unspecified: Secondary | ICD-10-CM | POA: Diagnosis not present

## 2023-08-12 NOTE — Telephone Encounter (Signed)
 Please see last visit and referrals.  She has been referred to neurology, and we have provided their contact information the past 2 visits to schedule that appointment.  Am I missing something else?  [facility] is near, hospital, not sure if that is who they are referring to.

## 2023-08-12 NOTE — Telephone Encounter (Signed)
  [facility] [address], [address] [place], [place] [address] [phone]  Patient husband provided this above information so they can call patient husband has not been with her in office the last couple times

## 2023-08-12 NOTE — Telephone Encounter (Signed)
 Pt husband requesting referral to Neurology so he can get a certificate of diminished capacity, used to go to one near [place] last time but willing to go anywhere.   Lab note: Call patient with results.  Electrolytes are overall stable.  Previous potassium level looks normal.  Thyroid  test is elevated but only borderline elevated.  At that level I think we should recheck numbers in the next 4 to 6 weeks, and at that time can decide if medication needed.  Neurology may also have some recommendations regarding this level and timing of treatment.

## 2023-08-13 ENCOUNTER — Telehealth: Payer: Self-pay | Admitting: Family Medicine

## 2023-08-13 DIAGNOSIS — E876 Hypokalemia: Secondary | ICD-10-CM | POA: Diagnosis not present

## 2023-08-13 DIAGNOSIS — M47812 Spondylosis without myelopathy or radiculopathy, cervical region: Secondary | ICD-10-CM | POA: Diagnosis not present

## 2023-08-13 DIAGNOSIS — W19XXXD Unspecified fall, subsequent encounter: Secondary | ICD-10-CM | POA: Diagnosis not present

## 2023-08-13 DIAGNOSIS — K5790 Diverticulosis of intestine, part unspecified, without perforation or abscess without bleeding: Secondary | ICD-10-CM | POA: Diagnosis not present

## 2023-08-13 DIAGNOSIS — H353132 Nonexudative age-related macular degeneration, bilateral, intermediate dry stage: Secondary | ICD-10-CM | POA: Diagnosis not present

## 2023-08-13 DIAGNOSIS — N183 Chronic kidney disease, stage 3 unspecified: Secondary | ICD-10-CM | POA: Diagnosis not present

## 2023-08-13 DIAGNOSIS — S2232XD Fracture of one rib, left side, subsequent encounter for fracture with routine healing: Secondary | ICD-10-CM | POA: Diagnosis not present

## 2023-08-13 DIAGNOSIS — E785 Hyperlipidemia, unspecified: Secondary | ICD-10-CM | POA: Diagnosis not present

## 2023-08-13 DIAGNOSIS — Z556 Problems related to health literacy: Secondary | ICD-10-CM | POA: Diagnosis not present

## 2023-08-13 DIAGNOSIS — M1990 Unspecified osteoarthritis, unspecified site: Secondary | ICD-10-CM | POA: Diagnosis not present

## 2023-08-13 DIAGNOSIS — G309 Alzheimer's disease, unspecified: Secondary | ICD-10-CM | POA: Diagnosis not present

## 2023-08-13 DIAGNOSIS — Z9181 History of falling: Secondary | ICD-10-CM | POA: Diagnosis not present

## 2023-08-13 DIAGNOSIS — E871 Hypo-osmolality and hyponatremia: Secondary | ICD-10-CM | POA: Diagnosis not present

## 2023-08-13 DIAGNOSIS — Z87891 Personal history of nicotine dependence: Secondary | ICD-10-CM | POA: Diagnosis not present

## 2023-08-13 DIAGNOSIS — M8580 Other specified disorders of bone density and structure, unspecified site: Secondary | ICD-10-CM | POA: Diagnosis not present

## 2023-08-13 DIAGNOSIS — G4730 Sleep apnea, unspecified: Secondary | ICD-10-CM | POA: Diagnosis not present

## 2023-08-13 DIAGNOSIS — I129 Hypertensive chronic kidney disease with stage 1 through stage 4 chronic kidney disease, or unspecified chronic kidney disease: Secondary | ICD-10-CM | POA: Diagnosis not present

## 2023-08-13 NOTE — Telephone Encounter (Signed)
 Paperwork has been collected and placed in PCPs sign folder

## 2023-08-13 NOTE — Telephone Encounter (Signed)
 Home Health Certification or Plan of Care Tracking  Is this a Certification or Plan of Care? Plan of Care  [facility] Agency: [facility]  Order Number:  N/A  Has charge sheet been attached? Yes  Where has form been placed:   Labeled & placed in provider bin

## 2023-08-14 NOTE — Telephone Encounter (Signed)
 Paperwork completed and placed in fax bin at back nurse station

## 2023-08-15 ENCOUNTER — Encounter: Payer: Self-pay | Admitting: Physician Assistant

## 2023-08-15 ENCOUNTER — Ambulatory Visit: Payer: Medicare Other | Admitting: Physician Assistant

## 2023-08-15 ENCOUNTER — Ambulatory Visit (INDEPENDENT_AMBULATORY_CARE_PROVIDER_SITE_OTHER)
Admission: RE | Admit: 2023-08-15 | Discharge: 2023-08-15 | Disposition: A | Discharge status: Home / Self Care | Payer: Medicare Other | Source: Ambulatory Visit | Attending: Family Medicine | Admitting: Family Medicine

## 2023-08-15 VITALS — BP 122/51 | HR 91 | Resp 18 | Ht 61.0 in | Wt 130.0 lb

## 2023-08-15 DIAGNOSIS — M7989 Other specified soft tissue disorders: Secondary | ICD-10-CM | POA: Diagnosis not present

## 2023-08-15 DIAGNOSIS — G309 Alzheimer's disease, unspecified: Secondary | ICD-10-CM | POA: Diagnosis not present

## 2023-08-15 DIAGNOSIS — F0280 Dementia in other diseases classified elsewhere without behavioral disturbance: Secondary | ICD-10-CM | POA: Diagnosis not present

## 2023-08-15 DIAGNOSIS — M79642 Pain in left hand: Secondary | ICD-10-CM | POA: Diagnosis not present

## 2023-08-15 DIAGNOSIS — M1812 Unilateral primary osteoarthritis of first carpometacarpal joint, left hand: Secondary | ICD-10-CM | POA: Diagnosis not present

## 2023-08-15 NOTE — Progress Notes (Signed)
Assessment/Plan:   Dementia likely due to Alzheimer disease  Allison SPROWL is a very pleasant 83 y.o. RH female with a history of frequent falls with multiple rib fractures followed by orthopedics seen today in follow up for memory loss. Patient was memantine in the past, but lost to follow-up since May 2023.  At this time, cognitive decline is significant resuming memantine would not be of therapeutic benefit, the risk of it including dizziness, would outweigh the benefit of it.  She needs more assistance with ADLs than prior.  MMSE 19/30 (however the patient needs highly educated).  Discussed the possibility of memory care for social and cognitive stimulation, but the patient's companion feel that she would be happier at home with home health nurse.  Discussed the results of recent CT of the head which shows significant atrophy as well as hippocampal atrophy, all consistent with findings in Alzheimer's disease individuals.  Of note, recent labs show that the patient is anemic, and her thyroid levels are elevated, which may contribute to a certain extent to memory difficulties.  She was instructed to follow-up with PCP.  No follow-up is indicated. Recommend good control of her cardiovascular risk factors.  Patient and companion informed of very elevated blood pressure today (she is not on any medications).  Follow-up closely with PCP, the patient may need BP meds Continue to control mood as per PCP Recommend good control of her anemia and thyroid disease which may affect her memory In the past, her B1 was low at 8 recommend to her to follow-up on these levels as well. Follow-up with orthopedics, continue PT and OT Recommend memory care for safety, social and cognitive stimulation   Subjective:    This patient is accompanied in the office by her friend [name] who supplements the history.  Previous records as well as any outside records available were reviewed prior to todays visit. Patient was  last seen on 12/20/2021 with an MMSE of 11/30.   Any changes in memory since last visit? "It is great"-she says.  "In the moment she may forget "he says.  "I tell her not to play the piano and she does it anyway especially at night, and is enjoying the neighbor downstairs.  Both short-term and long-term memory are affected. repeats oneself?  Endorsed, frequently.  During this visit she mentions several times "I like my medicines to go to [facility] " Disoriented when walking into a room?  Patient denies    Leaving objects?  May misplace things but not in unusual places   Wandering behavior?  denies. Any personality changes since last visit?  denies. Any worsening depression?:  Denies.  Companion agrees Hallucinations or paranoia?  Denies.  Companion agrees. Seizures? denies.  Any sleep changes?  She does not sleep at night and sleeps during the day.  Denies vivid dreams, REM behavior or sleepwalking   Sleep apnea?   Denies.   Any hygiene concerns?  Needs assistance due to multiple falls.  She has HHN on board. She is followed with Ortho, doing PT/OT.  Independent of bathing and dressing?  She needs assistance, cannot remember how to get dressed. Does the patient needs help with medications?  She is not taking any meds but just tylenol as needed Who is in charge of the finances?  Husband and her son is in charge Any changes in appetite? " Appetite is healthy". "I take a lt of nutritional milk shakes "-Phil disagrees. She may binge on granola bars  and can finish the whole box, eats hot dogs      Patient have trouble swallowing? Denies.   Does the patient cook? No. She confabulates by stating that she cooks hot dogs and eggs (Phil shakes his head) Any headaches?   denies   Chronic back pain  denies    Ambulates with difficulty?  Endorsed, with frequent falls, multiple fractures, she has decreased range of motion, Recent falls or head injuries?  She continues to have frequent falls and  this is followed by orthopedics.  She does PT OT Unilateral weakness, numbness or tingling? denies   Any tremors?  Mild right hand essential tremors.  Any anosmia?  Denies   Any incontinence of urine?  Endorsed, wears Depends Any bowel dysfunction?   She has  stool incontinence for several months. Patient lives with a roommate.  Her husband and her are separated, but they are in touch on a daily basis. Does the patient drive? No longer drives   Most recent labs January 2025 TSH elevated at 8.53, on December 2024 her H&H was 9 and 27.3 respectively    CT of the head December 2024 taken after her multiple falls was without acute intracranial process, but with age-related cerebral atrophy, ex vacuo dilatation in the ventricles, progressed since 2020, as well as chronic small vessel ischemic disease.  PREVIOUS MEDICATIONS:   CURRENT MEDICATIONS:  Outpatient Encounter Medications as of 08/15/2023  Medication Sig   citalopram (CELEXA) 20 MG tablet Take 1 tablet (20 mg total) by mouth daily. (Patient not taking: Reported on 08/15/2023)   folic acid (FOLVITE) 1 MG tablet Take 1 tablet (1 mg total) by mouth daily. (Patient not taking: Reported on 08/15/2023)   hydrOXYzine (ATARAX) 25 MG tablet Take 0.5 tablets (12.5 mg total) by mouth at bedtime as needed for anxiety. (Patient not taking: Reported on 08/15/2023)   memantine (NAMENDA) 10 MG tablet Take 1 tablet (10 mg at night) for 2 weeks, then increase to 1 tablet (10 mg) twice a day (Patient not taking: Reported on 08/15/2023)   Multiple Vitamins-Minerals (PRESERVISION AREDS 2+MULTI VIT) CAPS Take by mouth daily. (Patient not taking: Reported on 08/15/2023)   Multiple Vitamins-Minerals (WOMENS ONE DAILY) TABS Take 1 tablet by mouth 1 day or 1 dose. (Patient not taking: Reported on 08/15/2023)   thiamine 100 MG tablet Take 1 tablet (100 mg total) by mouth daily. (Patient not taking: Reported on 08/15/2023)   No facility-administered encounter medications  on file as of 08/15/2023.       08/15/2023   12:00 PM 03/04/2018    2:54 PM  MMSE - Mini Mental State Exam  Orientation to time 1 5  Orientation to Place 3 5  Registration 3 3  Attention/ Calculation 4 4  Recall 0 3  Language- name 2 objects 2 2  Language- repeat 1 1  Language- follow 3 step command 3 3  Language- read & follow direction 1 1  Write a sentence 1 1  Copy design 0 1  Total score 19 29      12/20/2021   12:00 PM  Montreal Cognitive Assessment   Visuospatial/ Executive (0/5) 0  Naming (0/3) 3  Attention: Read list of digits (0/2) 1  Attention: Read list of letters (0/1) 1  Attention: Serial 7 subtraction starting at 100 (0/3) 0  Language: Repeat phrase (0/2) 2  Language : Fluency (0/1) 1  Abstraction (0/2) 1  Delayed Recall (0/5) 0  Orientation (0/6) 2  Total 11  Adjusted Score (based on education) 11    Objective:     PHYSICAL EXAMINATION:    VITALS:   Vitals:   08/15/23 1102 08/15/23 1204  BP: (!) 144/101 (!) 122/51  Pulse: 91   Resp: 18   SpO2: 95%   Weight: 130 lb (59 kg)   Height: 5\' 1"  (1.549 m)     GEN:  The patient appears stated age and is in NAD. HEENT:  Normocephalic, atraumatic.   Neurological examination:  General: NAD, well-groomed, appears stated age. Orientation: The patient is alert. Oriented to person, not to place and date Cranial nerves: There is good facial symmetry.The speech is fluent and clear, at times tangential. No aphasia or dysarthria. Fund of knowledge is reduced. Recent and remote memory are impaired. Attention and concentration are reduced.  Unable to name objects and able to repeat phrases.  Hearing is intact to conversational tone.  Sensation: Sensation is intact to light touch throughout Motor: Strength is at least antigravity x 3. LUE mildly decreased strength.  Finger contracture is noted on the left, she has an appointment with orthopedics and hand surgery.   DTR's 2/4 in UE/LE     Movement  examination: Tone: There is normal tone in the UE/LE Abnormal movements: Mild essential right hand tremor.  No myoclonus.  No asterixis.   Coordination:  There is decremation with RAM's. Abnormal finger to nose.  She has limited ROM after multiple falls. Gait and Station: The patient has difficulty arising out of a deep-seated chair without the use of the hands. The patient's stride length is short, but no shuffling is noted.  Gait is cautious and narrow.    Thank you for allowing [place] the opportunity to participate in the care of this nice patient. Please do not hesitate to contact us for any questions or concerns.   Total time spent on today's visit was 56 minutes dedicated to this patient today, preparing to see patient, examining the patient, ordering tests and/or medications and counseling the patient, documenting clinical information in the EHR or other health record, independently interpreting results and communicating results to the patient/family, discussing treatment and goals, answering patient's questions and coordinating care.  Cc:  Shade Flood, MD  Marlowe Kays 08/15/2023 12:32 PM

## 2023-08-15 NOTE — Patient Instructions (Addendum)
It was a pleasure to see you today at our office.   Recommendations:   Monitor anemia and thyroid, and B1, B1 2 with your doctor  Continue PT/OT Consider memory care social stimulation, cognitive stimulation and safety versus home health nurse      Whom to call:  Memory  decline, memory medications: Call out office [phone]   For psychiatric meds, mood meds: Please have your primary care physician manage these medications.   Counseling regarding caregiver distress, including caregiver depression, anxiety and issues regarding community resources, adult day care programs, adult living facilities, or memory care questions:       For assessment of decision of mental capacity and competency:  Call Dr. Erick Blinks, geriatric psychiatrist at [phone]  For guidance in geriatric dementia issues please call [facility] [phone]  For guidance regarding [facility] and if placement were needed at the facility, contact Sidney Ace, Social Worker tel: [phone]  If you have any severe symptoms of a stroke, or other severe issues such as confusion,severe chills or fever, etc call 911 or go to the ER as you may need to be evaluate further   Feel free to visit Facebook page " [email or web address]" for tips of how to care for people with memory problems.      RECOMMENDATIONS FOR ALL PATIENTS WITH MEMORY PROBLEMS: 1. Continue to exercise (Recommend 30 minutes of walking everyday, or 3 hours every week) 2. Increase social interactions - continue going to [place] and enjoy social gatherings with friends and family 3. Eat healthy, avoid fried foods and eat more fruits and vegetables 4. Maintain adequate blood pressure, blood sugar, and blood cholesterol level. Reducing the risk of stroke and cardiovascular disease also helps promoting better memory. 5. Avoid stressful situations. Live a simple life and avoid aggravations. Organize your time and prepare for the  next day in anticipation. 6. Sleep well, avoid any interruptions of sleep and avoid any distractions in the bedroom that may interfere with adequate sleep quality 7. Avoid sugar, avoid sweets as there is a strong link between excessive sugar intake, diabetes, and cognitive impairment We discussed the Mediterranean diet, which has been shown to help patients reduce the risk of progressive memory disorders and reduces cardiovascular risk. This includes eating fish, eat fruits and green leafy vegetables, nuts like almonds and hazelnuts, walnuts, and also use olive oil. Avoid fast foods and fried foods as much as possible. Avoid sweets and sugar as sugar use has been linked to worsening of memory function.  There is always a concern of gradual progression of memory problems. If this is the case, then we may need to adjust level of care according to patient needs. Support, both to the patient and caregiver, should then be put into place.      You have been referred for a neuropsychological evaluation (i.e., evaluation of memory and thinking abilities). Please bring someone with you to this appointment if possible, as it is helpful for the doctor to hear from both you and another adult who knows you well. Please bring eyeglasses and hearing aids if you wear them.    The evaluation will take approximately 3 hours and has two parts:   The first part is a clinical interview with the neuropsychologist (Dr. Milbert Coulter or Dr. Roseanne Reno). During the interview, the neuropsychologist will speak with you and the individual you brought to the appointment.    The second part of the evaluation is testing with the doctor's technician Annabelle Harman  or Selena Batten). During the testing, the technician will ask you to remember different types of material, solve problems, and answer some questionnaires. Your family member will not be present for this portion of the evaluation.   Please note: We must reserve several hours of the  neuropsychologist's time and the psychometrician's time for your evaluation appointment. As such, there is a No-Show fee of $100. If you are unable to attend any of your appointments, please contact our office as soon as possible to reschedule.    FALL PRECAUTIONS: Be cautious when walking. Scan the area for obstacles that may increase the risk of trips and falls. When getting up in the mornings, sit up at the edge of the bed for a few minutes before getting out of bed. Consider elevating the bed at the head end to avoid drop of blood pressure when getting up. Walk always in a well-lit room (use night lights in the walls). Avoid area rugs or power cords from appliances in the middle of the walkways. Use a walker or a cane if necessary and consider physical therapy for balance exercise. Get your eyesight checked regularly.  FINANCIAL OVERSIGHT: Supervision, especially oversight when making financial decisions or transactions is also recommended.  HOME SAFETY: Consider the safety of the kitchen when operating appliances like stoves, microwave oven, and blender. Consider having supervision and share cooking responsibilities until no longer able to participate in those. Accidents with firearms and other hazards in the house should be identified and addressed as well.   ABILITY TO BE LEFT ALONE: If patient is unable to contact 911 operator, consider using LifeLine, or when the need is there, arrange for someone to stay with patients. Smoking is a fire hazard, consider supervision or cessation. Risk of wandering should be assessed by caregiver and if detected at any point, supervision and safe proof recommendations should be instituted.  MEDICATION SUPERVISION: Inability to self-administer medication needs to be constantly addressed. Implement a mechanism to ensure safe administration of the medications.   DRIVING: Regarding driving, in patients with progressive memory problems, driving will be impaired. We  advise to have someone else do the driving if trouble finding directions or if minor accidents are reported. Independent driving assessment is available to determine safety of driving.   If you are interested in the driving assessment, you can contact the following:  [employer] in [place] [phone]  [facility] [phone]  [facility] [phone] or [phone]    Mediterranean Diet A Mediterranean diet refers to food and lifestyle choices that are based on the traditions of countries located on the [employer]. This way of eating has been shown to help prevent certain conditions and improve outcomes for people who have chronic diseases, like kidney disease and heart disease. What are tips for following this plan? Lifestyle  Cook and eat meals together with your family, when possible. Drink enough fluid to keep your urine clear or pale yellow. Be physically active every day. This includes: Aerobic exercise like running or swimming. Leisure activities like gardening, walking, or housework. Get 7-8 hours of sleep each night. If recommended by your health care provider, drink red wine in moderation. This means 1 glass a day for nonpregnant women and 2 glasses a day for men. A glass of wine equals 5 oz (150 mL). Reading food labels  Check the serving size of packaged foods. For foods such as rice and pasta, the serving size refers to the amount  of cooked product, not dry. Check the total fat in packaged foods. Avoid foods that have saturated fat or trans fats. Check the ingredients list for added sugars, such as corn syrup. Shopping  At the grocery store, buy most of your food from the areas near the walls of the store. This includes: Fresh fruits and vegetables (produce). Grains, beans, nuts, and seeds. Some of these may be available in unpackaged forms or large amounts (in bulk). Fresh  seafood. Poultry and eggs. Low-fat dairy products. Buy whole ingredients instead of prepackaged foods. Buy fresh fruits and vegetables in-season from local farmers markets. Buy frozen fruits and vegetables in resealable bags. If you do not have access to quality fresh seafood, buy precooked frozen shrimp or canned fish, such as tuna, salmon, or sardines. Buy small amounts of raw or cooked vegetables, salads, or olives from the deli or salad bar at your store. Stock your pantry so you always have certain foods on hand, such as olive oil, canned tuna, canned tomatoes, rice, pasta, and beans. Cooking  Cook foods with extra-virgin olive oil instead of using butter or other vegetable oils. Have meat as a side dish, and have vegetables or grains as your main dish. This means having meat in small portions or adding small amounts of meat to foods like pasta or stew. Use beans or vegetables instead of meat in common dishes like chili or lasagna. Experiment with different cooking methods. Try roasting or broiling vegetables instead of steaming or sauteing them. Add frozen vegetables to soups, stews, pasta, or rice. Add nuts or seeds for added healthy fat at each meal. You can add these to yogurt, salads, or vegetable dishes. Marinate fish or vegetables using olive oil, lemon juice, garlic, and fresh herbs. Meal planning  Plan to eat 1 vegetarian meal one day each week. Try to work up to 2 vegetarian meals, if possible. Eat seafood 2 or more times a week. Have healthy snacks readily available, such as: Vegetable sticks with hummus. Greek yogurt. Fruit and nut trail mix. Eat balanced meals throughout the week. This includes: Fruit: 2-3 servings a day Vegetables: 4-5 servings a day Low-fat dairy: 2 servings a day Fish, poultry, or lean meat: 1 serving a day Beans and legumes: 2 or more servings a week Nuts and seeds: 1-2 servings a day Whole grains: 6-8 servings a day Extra-virgin olive oil: 3-4  servings a day Limit red meat and sweets to only a few servings a month What are my food choices? Mediterranean diet Recommended Grains: Whole-grain pasta. Brown rice. Bulgar wheat. Polenta. Couscous. Whole-wheat bread. Orpah Cobb. Vegetables: Artichokes. Beets. Broccoli. Cabbage. Carrots. Eggplant. Green beans. Chard. Kale. Spinach. Onions. Leeks. Peas. Squash. Tomatoes. Peppers. Radishes. Fruits: Apples. Apricots. Avocado. Berries. Bananas. Cherries. Dates. Figs. Grapes. Lemons. Melon. Oranges. Peaches. Plums. Pomegranate. Meats and other protein foods: Beans. Almonds. Sunflower seeds. Pine nuts. Peanuts. Cod. Salmon. Scallops. Shrimp. Tuna. Tilapia. Clams. Oysters. Eggs. Dairy: Low-fat milk. Cheese. Greek yogurt. Beverages: Water. Red wine. Herbal tea. Fats and oils: Extra virgin olive oil. Avocado oil. Grape seed oil. Sweets and desserts: [place] yogurt with honey. Baked apples. Poached pears. Trail mix. Seasoning and other foods: Basil. Cilantro. Coriander. Cumin. Mint. Parsley. Sage. Rosemary. Tarragon. Garlic. Oregano. Thyme. Pepper. Balsalmic vinegar. Tahini. Hummus. Tomato sauce. Olives. Mushrooms. Limit these Grains: Prepackaged pasta or rice dishes. Prepackaged cereal with added sugar. Vegetables: Deep fried potatoes (french fries). Fruits: Fruit canned in syrup. Meats and other protein foods: Beef. Pork. Lamb. Poultry with skin. Hot  dogsTomasa Blase. Dairy: Ice cream. Sour cream. Whole milk. Beverages: Juice. Sugar-sweetened soft drinks. Beer. Liquor and spirits. Fats and oils: Butter. Canola oil. Vegetable oil. Beef fat (tallow). Lard. Sweets and desserts: Cookies. Cakes. Pies. Candy. Seasoning and other foods: Mayonnaise. Premade sauces and marinades. The items listed may not be a complete list. Talk with your dietitian about what dietary choices are right for you. Summary The Mediterranean diet includes both food and lifestyle choices. Eat a variety of fresh fruits and  vegetables, beans, nuts, seeds, and whole grains. Limit the amount of red meat and sweets that you eat. Talk with your health care provider about whether it is safe for you to drink red wine in moderation. This means 1 glass a day for nonpregnant women and 2 glasses a day for men. A glass of wine equals 5 oz (150 mL). This information is not intended to replace advice given to you by your health care provider. Make sure you discuss any questions you have with your health care provider. Document Released: 03/08/2016 Document Revised: 04/10/2016 Document Reviewed: 03/08/2016 Elsevier Interactive Patient Education  2017 [employer].

## 2023-08-16 ENCOUNTER — Telehealth: Payer: Self-pay

## 2023-08-16 NOTE — Telephone Encounter (Signed)
-----   Message from Shade Flood sent at 08/16/2023 12:56 PM EST ----- Please call patient - spouse or family friend to review results.  There was degenerative change or arthritis changes seen on multiple joints, some difficulty looking at the third and fourth fingers due to positioning but no definite fractures or acute bone issues seen.  Let me know if there are questions.

## 2023-08-19 NOTE — Telephone Encounter (Signed)
Called and left a voice message asking to give the office a call back. Will try calling again.

## 2023-08-20 ENCOUNTER — Encounter: Payer: Self-pay | Admitting: Family Medicine

## 2023-08-20 NOTE — Telephone Encounter (Signed)
Noted.  I think it would be best for a hand specialist to evaluate her further, and I did refer her to orthopedics for shoulder issues previously.  I am not sure if the specialist would evaluate her hand as well or if she needs a separate referral to hand specialist.  May be worth discussing at that orthopedic referral.  I know there were some concerns previously with her referrals and receiving contact from the specialist.  Please verify if they have received an appointment from orthopedics and if not we can provide number for [facility] to contact and schedule that appointment.  Let me know if there are further questions.

## 2023-08-20 NOTE — Telephone Encounter (Signed)
Patient's friend [name] returned my call and he is aware of the results. He states the results are good but he's just wanting to know what could possibly be done to help her hand it's always in a fist. He attempts to place a warm wash cloth in her hand to help open it but he states patient always screams when he attempts to do that. I let him know that I would send this to Dr. Neva Seat and see if there was anything else he could advise.

## 2023-08-20 NOTE — Telephone Encounter (Signed)
Third attempt at reaching patient. Lvm, will send [employer] letter.

## 2023-08-21 NOTE — Telephone Encounter (Signed)
Called and spoke to husband Allison Chavez who stated he has not been in charge of scheduling Allison Chavez appointments and I need to reach out to him to determine if they have made an appointment yet with Ortho

## 2023-08-21 NOTE — Telephone Encounter (Signed)
Allison Chavez (family friend) who coordinates her care has stated he is in the process of discussing with Ortho an appt but does have PT set up on Friday for her to hopefully help, will call us back if any further assistance is necessary

## 2023-08-23 ENCOUNTER — Telehealth: Payer: Self-pay

## 2023-08-23 DIAGNOSIS — H353132 Nonexudative age-related macular degeneration, bilateral, intermediate dry stage: Secondary | ICD-10-CM | POA: Diagnosis not present

## 2023-08-23 DIAGNOSIS — N183 Chronic kidney disease, stage 3 unspecified: Secondary | ICD-10-CM | POA: Diagnosis not present

## 2023-08-23 DIAGNOSIS — Z9181 History of falling: Secondary | ICD-10-CM | POA: Diagnosis not present

## 2023-08-23 DIAGNOSIS — E871 Hypo-osmolality and hyponatremia: Secondary | ICD-10-CM | POA: Diagnosis not present

## 2023-08-23 DIAGNOSIS — W19XXXD Unspecified fall, subsequent encounter: Secondary | ICD-10-CM | POA: Diagnosis not present

## 2023-08-23 DIAGNOSIS — M8580 Other specified disorders of bone density and structure, unspecified site: Secondary | ICD-10-CM | POA: Diagnosis not present

## 2023-08-23 DIAGNOSIS — K5790 Diverticulosis of intestine, part unspecified, without perforation or abscess without bleeding: Secondary | ICD-10-CM | POA: Diagnosis not present

## 2023-08-23 DIAGNOSIS — S2232XD Fracture of one rib, left side, subsequent encounter for fracture with routine healing: Secondary | ICD-10-CM | POA: Diagnosis not present

## 2023-08-23 DIAGNOSIS — M47812 Spondylosis without myelopathy or radiculopathy, cervical region: Secondary | ICD-10-CM | POA: Diagnosis not present

## 2023-08-23 DIAGNOSIS — Z87891 Personal history of nicotine dependence: Secondary | ICD-10-CM | POA: Diagnosis not present

## 2023-08-23 DIAGNOSIS — I129 Hypertensive chronic kidney disease with stage 1 through stage 4 chronic kidney disease, or unspecified chronic kidney disease: Secondary | ICD-10-CM | POA: Diagnosis not present

## 2023-08-23 DIAGNOSIS — G309 Alzheimer's disease, unspecified: Secondary | ICD-10-CM | POA: Diagnosis not present

## 2023-08-23 DIAGNOSIS — E876 Hypokalemia: Secondary | ICD-10-CM | POA: Diagnosis not present

## 2023-08-23 DIAGNOSIS — Z556 Problems related to health literacy: Secondary | ICD-10-CM | POA: Diagnosis not present

## 2023-08-23 DIAGNOSIS — G4730 Sleep apnea, unspecified: Secondary | ICD-10-CM | POA: Diagnosis not present

## 2023-08-23 DIAGNOSIS — E785 Hyperlipidemia, unspecified: Secondary | ICD-10-CM | POA: Diagnosis not present

## 2023-08-23 DIAGNOSIS — M1990 Unspecified osteoarthritis, unspecified site: Secondary | ICD-10-CM | POA: Diagnosis not present

## 2023-08-23 NOTE — Telephone Encounter (Signed)
Request for verbal order approval

## 2023-08-23 NOTE — Telephone Encounter (Signed)
Copied from CRM [id number]. Topic: General - Other >> Aug 23, 2023  3:09 PM Prudencio Pair wrote: Reason for CRM: Efraim Kaufmann, with [facility], is wanting to see if Dr. Neva Seat can order a platform walker attachment for left for patient?

## 2023-08-23 NOTE — Telephone Encounter (Addendum)
I am fine with verbal order for OT and also ok with platform attachment. Can that also be a verbal order,  or do they need a paper prescription faxed?

## 2023-08-23 NOTE — Telephone Encounter (Signed)
Copied from CRM [id number]. Topic: Clinical - Home Health Verbal Orders >> Aug 23, 2023  3:05 PM Prudencio Pair wrote: Caller/Agency: [facility] Callback Number: [phone] Service Requested: Occupational Therapy Frequency: once a week for three weeks Any new concerns about the patient? No

## 2023-08-26 ENCOUNTER — Telehealth: Payer: Self-pay | Admitting: Family Medicine

## 2023-08-26 NOTE — Telephone Encounter (Signed)
Paperwork has been collected and placed in PCPs sign folder

## 2023-08-26 NOTE — Telephone Encounter (Signed)
Home Health Certification or Plan of Care Tracking  Is this a Certification or Plan of Care? Plan of Care  [facility] Agency: [facility]  Order Number:  [id number]  Has charge sheet been attached? Yes  Where has form been placed:   Labeled & placed in provider bin

## 2023-08-26 NOTE — Telephone Encounter (Signed)
Paperwork completed and placed in fax bin at back nurse station

## 2023-08-26 NOTE — Telephone Encounter (Signed)
Called back and provided verbal orders for the OT but also let her know platform attachment is okay and to let us know if this needs to be a written order.

## 2023-08-27 DIAGNOSIS — K5790 Diverticulosis of intestine, part unspecified, without perforation or abscess without bleeding: Secondary | ICD-10-CM | POA: Diagnosis not present

## 2023-08-27 DIAGNOSIS — G309 Alzheimer's disease, unspecified: Secondary | ICD-10-CM | POA: Diagnosis not present

## 2023-08-27 DIAGNOSIS — M47812 Spondylosis without myelopathy or radiculopathy, cervical region: Secondary | ICD-10-CM | POA: Diagnosis not present

## 2023-08-27 DIAGNOSIS — G4730 Sleep apnea, unspecified: Secondary | ICD-10-CM | POA: Diagnosis not present

## 2023-08-27 DIAGNOSIS — N183 Chronic kidney disease, stage 3 unspecified: Secondary | ICD-10-CM | POA: Diagnosis not present

## 2023-08-27 DIAGNOSIS — I129 Hypertensive chronic kidney disease with stage 1 through stage 4 chronic kidney disease, or unspecified chronic kidney disease: Secondary | ICD-10-CM | POA: Diagnosis not present

## 2023-08-27 DIAGNOSIS — M62838 Other muscle spasm: Secondary | ICD-10-CM | POA: Diagnosis not present

## 2023-08-27 DIAGNOSIS — E785 Hyperlipidemia, unspecified: Secondary | ICD-10-CM | POA: Diagnosis not present

## 2023-08-27 DIAGNOSIS — Z556 Problems related to health literacy: Secondary | ICD-10-CM | POA: Diagnosis not present

## 2023-08-27 DIAGNOSIS — Z9181 History of falling: Secondary | ICD-10-CM | POA: Diagnosis not present

## 2023-08-27 DIAGNOSIS — E876 Hypokalemia: Secondary | ICD-10-CM | POA: Diagnosis not present

## 2023-08-27 DIAGNOSIS — W19XXXD Unspecified fall, subsequent encounter: Secondary | ICD-10-CM | POA: Diagnosis not present

## 2023-08-27 DIAGNOSIS — M1990 Unspecified osteoarthritis, unspecified site: Secondary | ICD-10-CM | POA: Diagnosis not present

## 2023-08-27 DIAGNOSIS — H353132 Nonexudative age-related macular degeneration, bilateral, intermediate dry stage: Secondary | ICD-10-CM | POA: Diagnosis not present

## 2023-08-27 DIAGNOSIS — M24542 Contracture, left hand: Secondary | ICD-10-CM | POA: Diagnosis not present

## 2023-08-27 DIAGNOSIS — M8580 Other specified disorders of bone density and structure, unspecified site: Secondary | ICD-10-CM | POA: Diagnosis not present

## 2023-08-27 DIAGNOSIS — Z87891 Personal history of nicotine dependence: Secondary | ICD-10-CM | POA: Diagnosis not present

## 2023-08-27 DIAGNOSIS — S2232XD Fracture of one rib, left side, subsequent encounter for fracture with routine healing: Secondary | ICD-10-CM | POA: Diagnosis not present

## 2023-08-27 DIAGNOSIS — E871 Hypo-osmolality and hyponatremia: Secondary | ICD-10-CM | POA: Diagnosis not present

## 2023-09-03 DIAGNOSIS — S2232XD Fracture of one rib, left side, subsequent encounter for fracture with routine healing: Secondary | ICD-10-CM | POA: Diagnosis not present

## 2023-09-03 DIAGNOSIS — K5790 Diverticulosis of intestine, part unspecified, without perforation or abscess without bleeding: Secondary | ICD-10-CM | POA: Diagnosis not present

## 2023-09-03 DIAGNOSIS — H353132 Nonexudative age-related macular degeneration, bilateral, intermediate dry stage: Secondary | ICD-10-CM | POA: Diagnosis not present

## 2023-09-03 DIAGNOSIS — Z9181 History of falling: Secondary | ICD-10-CM | POA: Diagnosis not present

## 2023-09-03 DIAGNOSIS — M1990 Unspecified osteoarthritis, unspecified site: Secondary | ICD-10-CM | POA: Diagnosis not present

## 2023-09-03 DIAGNOSIS — G309 Alzheimer's disease, unspecified: Secondary | ICD-10-CM | POA: Diagnosis not present

## 2023-09-03 DIAGNOSIS — Z556 Problems related to health literacy: Secondary | ICD-10-CM | POA: Diagnosis not present

## 2023-09-03 DIAGNOSIS — E785 Hyperlipidemia, unspecified: Secondary | ICD-10-CM | POA: Diagnosis not present

## 2023-09-03 DIAGNOSIS — W19XXXD Unspecified fall, subsequent encounter: Secondary | ICD-10-CM | POA: Diagnosis not present

## 2023-09-03 DIAGNOSIS — M8580 Other specified disorders of bone density and structure, unspecified site: Secondary | ICD-10-CM | POA: Diagnosis not present

## 2023-09-03 DIAGNOSIS — M47812 Spondylosis without myelopathy or radiculopathy, cervical region: Secondary | ICD-10-CM | POA: Diagnosis not present

## 2023-09-03 DIAGNOSIS — E876 Hypokalemia: Secondary | ICD-10-CM | POA: Diagnosis not present

## 2023-09-03 DIAGNOSIS — I129 Hypertensive chronic kidney disease with stage 1 through stage 4 chronic kidney disease, or unspecified chronic kidney disease: Secondary | ICD-10-CM | POA: Diagnosis not present

## 2023-09-03 DIAGNOSIS — N183 Chronic kidney disease, stage 3 unspecified: Secondary | ICD-10-CM | POA: Diagnosis not present

## 2023-09-03 DIAGNOSIS — G4730 Sleep apnea, unspecified: Secondary | ICD-10-CM | POA: Diagnosis not present

## 2023-09-03 DIAGNOSIS — E871 Hypo-osmolality and hyponatremia: Secondary | ICD-10-CM | POA: Diagnosis not present

## 2023-09-03 DIAGNOSIS — Z87891 Personal history of nicotine dependence: Secondary | ICD-10-CM | POA: Diagnosis not present

## 2023-09-12 DIAGNOSIS — E785 Hyperlipidemia, unspecified: Secondary | ICD-10-CM | POA: Diagnosis not present

## 2023-09-12 DIAGNOSIS — N183 Chronic kidney disease, stage 3 unspecified: Secondary | ICD-10-CM | POA: Diagnosis not present

## 2023-09-12 DIAGNOSIS — E871 Hypo-osmolality and hyponatremia: Secondary | ICD-10-CM | POA: Diagnosis not present

## 2023-09-12 DIAGNOSIS — Z556 Problems related to health literacy: Secondary | ICD-10-CM | POA: Diagnosis not present

## 2023-09-12 DIAGNOSIS — K5790 Diverticulosis of intestine, part unspecified, without perforation or abscess without bleeding: Secondary | ICD-10-CM | POA: Diagnosis not present

## 2023-09-12 DIAGNOSIS — H353132 Nonexudative age-related macular degeneration, bilateral, intermediate dry stage: Secondary | ICD-10-CM | POA: Diagnosis not present

## 2023-09-12 DIAGNOSIS — S2232XD Fracture of one rib, left side, subsequent encounter for fracture with routine healing: Secondary | ICD-10-CM | POA: Diagnosis not present

## 2023-09-12 DIAGNOSIS — M8580 Other specified disorders of bone density and structure, unspecified site: Secondary | ICD-10-CM | POA: Diagnosis not present

## 2023-09-12 DIAGNOSIS — Z9181 History of falling: Secondary | ICD-10-CM | POA: Diagnosis not present

## 2023-09-12 DIAGNOSIS — G309 Alzheimer's disease, unspecified: Secondary | ICD-10-CM | POA: Diagnosis not present

## 2023-09-12 DIAGNOSIS — M47812 Spondylosis without myelopathy or radiculopathy, cervical region: Secondary | ICD-10-CM | POA: Diagnosis not present

## 2023-09-12 DIAGNOSIS — M1990 Unspecified osteoarthritis, unspecified site: Secondary | ICD-10-CM | POA: Diagnosis not present

## 2023-09-12 DIAGNOSIS — Z87891 Personal history of nicotine dependence: Secondary | ICD-10-CM | POA: Diagnosis not present

## 2023-09-12 DIAGNOSIS — E876 Hypokalemia: Secondary | ICD-10-CM | POA: Diagnosis not present

## 2023-09-12 DIAGNOSIS — W19XXXD Unspecified fall, subsequent encounter: Secondary | ICD-10-CM | POA: Diagnosis not present

## 2023-09-12 DIAGNOSIS — I129 Hypertensive chronic kidney disease with stage 1 through stage 4 chronic kidney disease, or unspecified chronic kidney disease: Secondary | ICD-10-CM | POA: Diagnosis not present

## 2023-09-12 DIAGNOSIS — G4730 Sleep apnea, unspecified: Secondary | ICD-10-CM | POA: Diagnosis not present

## 2023-09-13 ENCOUNTER — Ambulatory Visit: Payer: Self-pay | Admitting: Family Medicine

## 2023-09-13 DIAGNOSIS — H353132 Nonexudative age-related macular degeneration, bilateral, intermediate dry stage: Secondary | ICD-10-CM | POA: Diagnosis not present

## 2023-09-13 DIAGNOSIS — E876 Hypokalemia: Secondary | ICD-10-CM | POA: Diagnosis not present

## 2023-09-13 DIAGNOSIS — M47812 Spondylosis without myelopathy or radiculopathy, cervical region: Secondary | ICD-10-CM | POA: Diagnosis not present

## 2023-09-13 DIAGNOSIS — I129 Hypertensive chronic kidney disease with stage 1 through stage 4 chronic kidney disease, or unspecified chronic kidney disease: Secondary | ICD-10-CM | POA: Diagnosis not present

## 2023-09-13 DIAGNOSIS — G4730 Sleep apnea, unspecified: Secondary | ICD-10-CM | POA: Diagnosis not present

## 2023-09-13 DIAGNOSIS — M1990 Unspecified osteoarthritis, unspecified site: Secondary | ICD-10-CM | POA: Diagnosis not present

## 2023-09-13 DIAGNOSIS — Z87891 Personal history of nicotine dependence: Secondary | ICD-10-CM | POA: Diagnosis not present

## 2023-09-13 DIAGNOSIS — S2232XD Fracture of one rib, left side, subsequent encounter for fracture with routine healing: Secondary | ICD-10-CM | POA: Diagnosis not present

## 2023-09-13 DIAGNOSIS — E785 Hyperlipidemia, unspecified: Secondary | ICD-10-CM | POA: Diagnosis not present

## 2023-09-13 DIAGNOSIS — E871 Hypo-osmolality and hyponatremia: Secondary | ICD-10-CM | POA: Diagnosis not present

## 2023-09-13 DIAGNOSIS — R300 Dysuria: Secondary | ICD-10-CM | POA: Diagnosis not present

## 2023-09-13 DIAGNOSIS — N183 Chronic kidney disease, stage 3 unspecified: Secondary | ICD-10-CM | POA: Diagnosis not present

## 2023-09-13 DIAGNOSIS — W19XXXD Unspecified fall, subsequent encounter: Secondary | ICD-10-CM | POA: Diagnosis not present

## 2023-09-13 DIAGNOSIS — K5790 Diverticulosis of intestine, part unspecified, without perforation or abscess without bleeding: Secondary | ICD-10-CM | POA: Diagnosis not present

## 2023-09-13 DIAGNOSIS — Z9181 History of falling: Secondary | ICD-10-CM | POA: Diagnosis not present

## 2023-09-13 DIAGNOSIS — M8580 Other specified disorders of bone density and structure, unspecified site: Secondary | ICD-10-CM | POA: Diagnosis not present

## 2023-09-13 DIAGNOSIS — Z556 Problems related to health literacy: Secondary | ICD-10-CM | POA: Diagnosis not present

## 2023-09-13 DIAGNOSIS — L309 Dermatitis, unspecified: Secondary | ICD-10-CM | POA: Diagnosis not present

## 2023-09-13 DIAGNOSIS — G309 Alzheimer's disease, unspecified: Secondary | ICD-10-CM | POA: Diagnosis not present

## 2023-09-13 NOTE — Telephone Encounter (Signed)
Chief Complaint: Odor in urine Symptoms: Increase urinary frequency Pertinent Negatives: Patient denies pain with urination, blood in urine, flank pain, fever Disposition: [] ED /[x] Urgent Care (no appt availability in office) / [] Appointment(In office/virtual)/ []  [place] [facility]/ [] Home Care/ [] Refused Recommended Disposition /[] [place] [facility]/ []  Follow-up with PCP Additional Notes: Spoke with Efraim Kaufmann, the OT with [facility]. Pt fell twice on 2/10 and 2/11 and has no bruises or red areas. Pt denies any pain from the fall. The pt has a yeast rash on her upper butt that has been there for one 1 week. The left side of her body is weak which the OT has noticed progressively for months. Pt incontinence has gotten worse over the past few months. Pt advised to go to urgent care today based off symptoms. Pt agreeable to plan. Pt also wants to see her PCP next week for rash on upper butt area. Appt made for next week for this issue. This RN educated pt on home care, new-worsening symptoms, when to call back/seek emergent care. Pt verbalized understanding and agrees to plan.    Copied from CRM [id number]. Topic: Clinical - Red Word Triage >> Sep 13, 2023  2:17 PM [place] wrote: Red Word that prompted transfer to Nurse Triage: Melissa from [facility] calling in with patient next to her... strong odor in urine, no bleeding or pain.Marland Kitchen also says left side of body is very weak.- Rash on lower back that is itchy.  patient had 3 falls on 2/10 and 2/11 Reason for Disposition  Bad or foul-smelling urine  Answer Assessment - Initial Assessment Questions 1. SYMPTOM: "What's the main symptom you're concerned about?" (e.g., frequency, incontinence)     Odor in urine 2. ONSET: "When did the odor  start?"     Yesterday 3. PAIN: "Is there any pain?" If Yes, ask: "How bad is it?" (Scale: 1-10; mild, moderate, severe)     Denies 4. CAUSE: "What do you think is causing the symptoms?"      Possible UTI 5. OTHER SYMPTOMS: "Do you have any other symptoms?" (e.g., blood in urine, fever, flank pain, pain with urination)     Denies  Protocols used: Urinary Symptoms-A-AH

## 2023-09-13 NOTE — Telephone Encounter (Signed)
Patient is going to Urgent care.

## 2023-09-15 DIAGNOSIS — Z9181 History of falling: Secondary | ICD-10-CM | POA: Diagnosis not present

## 2023-09-15 DIAGNOSIS — Z87891 Personal history of nicotine dependence: Secondary | ICD-10-CM | POA: Diagnosis not present

## 2023-09-15 DIAGNOSIS — W19XXXD Unspecified fall, subsequent encounter: Secondary | ICD-10-CM | POA: Diagnosis not present

## 2023-09-15 DIAGNOSIS — Z556 Problems related to health literacy: Secondary | ICD-10-CM | POA: Diagnosis not present

## 2023-09-15 DIAGNOSIS — I129 Hypertensive chronic kidney disease with stage 1 through stage 4 chronic kidney disease, or unspecified chronic kidney disease: Secondary | ICD-10-CM | POA: Diagnosis not present

## 2023-09-15 DIAGNOSIS — E785 Hyperlipidemia, unspecified: Secondary | ICD-10-CM | POA: Diagnosis not present

## 2023-09-15 DIAGNOSIS — G309 Alzheimer's disease, unspecified: Secondary | ICD-10-CM | POA: Diagnosis not present

## 2023-09-15 DIAGNOSIS — K5790 Diverticulosis of intestine, part unspecified, without perforation or abscess without bleeding: Secondary | ICD-10-CM | POA: Diagnosis not present

## 2023-09-15 DIAGNOSIS — E871 Hypo-osmolality and hyponatremia: Secondary | ICD-10-CM | POA: Diagnosis not present

## 2023-09-15 DIAGNOSIS — M47812 Spondylosis without myelopathy or radiculopathy, cervical region: Secondary | ICD-10-CM | POA: Diagnosis not present

## 2023-09-15 DIAGNOSIS — M8580 Other specified disorders of bone density and structure, unspecified site: Secondary | ICD-10-CM | POA: Diagnosis not present

## 2023-09-15 DIAGNOSIS — M1990 Unspecified osteoarthritis, unspecified site: Secondary | ICD-10-CM | POA: Diagnosis not present

## 2023-09-15 DIAGNOSIS — N183 Chronic kidney disease, stage 3 unspecified: Secondary | ICD-10-CM | POA: Diagnosis not present

## 2023-09-15 DIAGNOSIS — S2232XD Fracture of one rib, left side, subsequent encounter for fracture with routine healing: Secondary | ICD-10-CM | POA: Diagnosis not present

## 2023-09-15 DIAGNOSIS — H353132 Nonexudative age-related macular degeneration, bilateral, intermediate dry stage: Secondary | ICD-10-CM | POA: Diagnosis not present

## 2023-09-15 DIAGNOSIS — E876 Hypokalemia: Secondary | ICD-10-CM | POA: Diagnosis not present

## 2023-09-15 DIAGNOSIS — G4730 Sleep apnea, unspecified: Secondary | ICD-10-CM | POA: Diagnosis not present

## 2023-09-16 ENCOUNTER — Telehealth: Payer: Self-pay | Admitting: Family Medicine

## 2023-09-16 NOTE — Telephone Encounter (Signed)
 Paperwork completed and placed in fax bin at back nurse station

## 2023-09-16 NOTE — Telephone Encounter (Signed)
Home Health Certification or Plan of Care Tracking  Is this a Certification or Plan of Care? Plan of Care  [facility] Agency: [facility]  Order Number:  [id number]  Has charge sheet been attached? Yes  Where has form been placed:   Labeled & placed in provider bin

## 2023-09-16 NOTE — Telephone Encounter (Signed)
Placed in your sign folder for review, will fax back once complete

## 2023-09-18 ENCOUNTER — Ambulatory Visit: Payer: Medicare Other | Admitting: Family Medicine

## 2023-09-18 DIAGNOSIS — K5790 Diverticulosis of intestine, part unspecified, without perforation or abscess without bleeding: Secondary | ICD-10-CM | POA: Diagnosis not present

## 2023-09-18 DIAGNOSIS — K5900 Constipation, unspecified: Secondary | ICD-10-CM | POA: Diagnosis not present

## 2023-09-18 DIAGNOSIS — G309 Alzheimer's disease, unspecified: Secondary | ICD-10-CM | POA: Diagnosis not present

## 2023-09-18 DIAGNOSIS — G4730 Sleep apnea, unspecified: Secondary | ICD-10-CM | POA: Diagnosis not present

## 2023-09-18 DIAGNOSIS — M170 Bilateral primary osteoarthritis of knee: Secondary | ICD-10-CM | POA: Diagnosis not present

## 2023-09-18 DIAGNOSIS — H353132 Nonexudative age-related macular degeneration, bilateral, intermediate dry stage: Secondary | ICD-10-CM | POA: Diagnosis not present

## 2023-09-18 DIAGNOSIS — M19011 Primary osteoarthritis, right shoulder: Secondary | ICD-10-CM | POA: Diagnosis not present

## 2023-09-18 DIAGNOSIS — E876 Hypokalemia: Secondary | ICD-10-CM | POA: Diagnosis not present

## 2023-09-18 DIAGNOSIS — M47812 Spondylosis without myelopathy or radiculopathy, cervical region: Secondary | ICD-10-CM | POA: Diagnosis not present

## 2023-09-18 DIAGNOSIS — R3915 Urgency of urination: Secondary | ICD-10-CM | POA: Diagnosis not present

## 2023-09-18 DIAGNOSIS — R21 Rash and other nonspecific skin eruption: Secondary | ICD-10-CM | POA: Diagnosis not present

## 2023-09-18 DIAGNOSIS — S2232XD Fracture of one rib, left side, subsequent encounter for fracture with routine healing: Secondary | ICD-10-CM | POA: Diagnosis not present

## 2023-09-18 DIAGNOSIS — H409 Unspecified glaucoma: Secondary | ICD-10-CM | POA: Diagnosis not present

## 2023-09-18 DIAGNOSIS — E871 Hypo-osmolality and hyponatremia: Secondary | ICD-10-CM | POA: Diagnosis not present

## 2023-09-18 DIAGNOSIS — M8580 Other specified disorders of bone density and structure, unspecified site: Secondary | ICD-10-CM | POA: Diagnosis not present

## 2023-09-18 DIAGNOSIS — I2510 Atherosclerotic heart disease of native coronary artery without angina pectoris: Secondary | ICD-10-CM | POA: Diagnosis not present

## 2023-09-18 DIAGNOSIS — H269 Unspecified cataract: Secondary | ICD-10-CM | POA: Diagnosis not present

## 2023-09-18 DIAGNOSIS — N183 Chronic kidney disease, stage 3 unspecified: Secondary | ICD-10-CM | POA: Diagnosis not present

## 2023-09-18 DIAGNOSIS — I129 Hypertensive chronic kidney disease with stage 1 through stage 4 chronic kidney disease, or unspecified chronic kidney disease: Secondary | ICD-10-CM | POA: Diagnosis not present

## 2023-09-19 ENCOUNTER — Inpatient Hospital Stay (HOSPITAL_COMMUNITY)
Admission: EM | Admit: 2023-09-19 | Discharge: 2023-09-24 | DRG: 689 | Disposition: A | Discharge status: Skilled Nursing Facility | Payer: Medicare Other | Attending: Internal Medicine | Admitting: Internal Medicine

## 2023-09-19 ENCOUNTER — Ambulatory Visit: Payer: Medicare Other | Admitting: Family Medicine

## 2023-09-19 ENCOUNTER — Encounter (HOSPITAL_COMMUNITY): Payer: Self-pay

## 2023-09-19 ENCOUNTER — Emergency Department (HOSPITAL_COMMUNITY): Payer: Medicare Other

## 2023-09-19 ENCOUNTER — Other Ambulatory Visit: Payer: Self-pay

## 2023-09-19 DIAGNOSIS — Z884 Allergy status to anesthetic agent status: Secondary | ICD-10-CM

## 2023-09-19 DIAGNOSIS — Z8249 Family history of ischemic heart disease and other diseases of the circulatory system: Secondary | ICD-10-CM | POA: Diagnosis not present

## 2023-09-19 DIAGNOSIS — F329 Major depressive disorder, single episode, unspecified: Secondary | ICD-10-CM | POA: Diagnosis present

## 2023-09-19 DIAGNOSIS — Z833 Family history of diabetes mellitus: Secondary | ICD-10-CM | POA: Diagnosis not present

## 2023-09-19 DIAGNOSIS — I517 Cardiomegaly: Secondary | ICD-10-CM | POA: Diagnosis not present

## 2023-09-19 DIAGNOSIS — M47812 Spondylosis without myelopathy or radiculopathy, cervical region: Secondary | ICD-10-CM | POA: Diagnosis not present

## 2023-09-19 DIAGNOSIS — Z961 Presence of intraocular lens: Secondary | ICD-10-CM | POA: Diagnosis not present

## 2023-09-19 DIAGNOSIS — Z888 Allergy status to other drugs, medicaments and biological substances status: Secondary | ICD-10-CM

## 2023-09-19 DIAGNOSIS — K5790 Diverticulosis of intestine, part unspecified, without perforation or abscess without bleeding: Secondary | ICD-10-CM | POA: Diagnosis not present

## 2023-09-19 DIAGNOSIS — Z823 Family history of stroke: Secondary | ICD-10-CM

## 2023-09-19 DIAGNOSIS — F0283 Dementia in other diseases classified elsewhere, unspecified severity, with mood disturbance: Secondary | ICD-10-CM | POA: Diagnosis present

## 2023-09-19 DIAGNOSIS — E785 Hyperlipidemia, unspecified: Secondary | ICD-10-CM | POA: Diagnosis present

## 2023-09-19 DIAGNOSIS — Z7401 Bed confinement status: Secondary | ICD-10-CM | POA: Diagnosis not present

## 2023-09-19 DIAGNOSIS — B9620 Unspecified Escherichia coli [E. coli] as the cause of diseases classified elsewhere: Secondary | ICD-10-CM | POA: Diagnosis present

## 2023-09-19 DIAGNOSIS — I129 Hypertensive chronic kidney disease with stage 1 through stage 4 chronic kidney disease, or unspecified chronic kidney disease: Secondary | ICD-10-CM | POA: Diagnosis not present

## 2023-09-19 DIAGNOSIS — G309 Alzheimer's disease, unspecified: Secondary | ICD-10-CM | POA: Diagnosis present

## 2023-09-19 DIAGNOSIS — G9341 Metabolic encephalopathy: Secondary | ICD-10-CM | POA: Diagnosis present

## 2023-09-19 DIAGNOSIS — R4182 Altered mental status, unspecified: Secondary | ICD-10-CM

## 2023-09-19 DIAGNOSIS — B952 Enterococcus as the cause of diseases classified elsewhere: Secondary | ICD-10-CM | POA: Diagnosis present

## 2023-09-19 DIAGNOSIS — R109 Unspecified abdominal pain: Secondary | ICD-10-CM | POA: Diagnosis not present

## 2023-09-19 DIAGNOSIS — S2232XD Fracture of one rib, left side, subsequent encounter for fracture with routine healing: Secondary | ICD-10-CM | POA: Diagnosis not present

## 2023-09-19 DIAGNOSIS — N390 Urinary tract infection, site not specified: Secondary | ICD-10-CM | POA: Diagnosis not present

## 2023-09-19 DIAGNOSIS — R6889 Other general symptoms and signs: Secondary | ICD-10-CM | POA: Diagnosis not present

## 2023-09-19 DIAGNOSIS — Z6824 Body mass index (BMI) 24.0-24.9, adult: Secondary | ICD-10-CM

## 2023-09-19 DIAGNOSIS — Z9841 Cataract extraction status, right eye: Secondary | ICD-10-CM

## 2023-09-19 DIAGNOSIS — G4700 Insomnia, unspecified: Secondary | ICD-10-CM | POA: Diagnosis present

## 2023-09-19 DIAGNOSIS — R627 Adult failure to thrive: Secondary | ICD-10-CM | POA: Diagnosis not present

## 2023-09-19 DIAGNOSIS — Z87891 Personal history of nicotine dependence: Secondary | ICD-10-CM | POA: Diagnosis not present

## 2023-09-19 DIAGNOSIS — K5900 Constipation, unspecified: Secondary | ICD-10-CM | POA: Diagnosis not present

## 2023-09-19 DIAGNOSIS — H409 Unspecified glaucoma: Secondary | ICD-10-CM | POA: Diagnosis not present

## 2023-09-19 DIAGNOSIS — R2689 Other abnormalities of gait and mobility: Secondary | ICD-10-CM | POA: Diagnosis not present

## 2023-09-19 DIAGNOSIS — Z882 Allergy status to sulfonamides status: Secondary | ICD-10-CM | POA: Diagnosis not present

## 2023-09-19 DIAGNOSIS — I10 Essential (primary) hypertension: Secondary | ICD-10-CM | POA: Diagnosis present

## 2023-09-19 DIAGNOSIS — Z9842 Cataract extraction status, left eye: Secondary | ICD-10-CM | POA: Diagnosis not present

## 2023-09-19 DIAGNOSIS — R21 Rash and other nonspecific skin eruption: Secondary | ICD-10-CM | POA: Diagnosis present

## 2023-09-19 DIAGNOSIS — H269 Unspecified cataract: Secondary | ICD-10-CM | POA: Diagnosis not present

## 2023-09-19 DIAGNOSIS — G9389 Other specified disorders of brain: Secondary | ICD-10-CM | POA: Diagnosis not present

## 2023-09-19 DIAGNOSIS — D631 Anemia in chronic kidney disease: Secondary | ICD-10-CM | POA: Diagnosis present

## 2023-09-19 DIAGNOSIS — R3915 Urgency of urination: Secondary | ICD-10-CM | POA: Diagnosis not present

## 2023-09-19 DIAGNOSIS — M19011 Primary osteoarthritis, right shoulder: Secondary | ICD-10-CM | POA: Diagnosis not present

## 2023-09-19 DIAGNOSIS — E876 Hypokalemia: Secondary | ICD-10-CM | POA: Diagnosis not present

## 2023-09-19 DIAGNOSIS — M6281 Muscle weakness (generalized): Secondary | ICD-10-CM | POA: Diagnosis not present

## 2023-09-19 DIAGNOSIS — N1832 Chronic kidney disease, stage 3b: Secondary | ICD-10-CM | POA: Diagnosis not present

## 2023-09-19 DIAGNOSIS — H353132 Nonexudative age-related macular degeneration, bilateral, intermediate dry stage: Secondary | ICD-10-CM | POA: Diagnosis not present

## 2023-09-19 DIAGNOSIS — R278 Other lack of coordination: Secondary | ICD-10-CM | POA: Diagnosis not present

## 2023-09-19 DIAGNOSIS — Z743 Need for continuous supervision: Secondary | ICD-10-CM | POA: Diagnosis not present

## 2023-09-19 DIAGNOSIS — F411 Generalized anxiety disorder: Secondary | ICD-10-CM | POA: Diagnosis present

## 2023-09-19 DIAGNOSIS — E871 Hypo-osmolality and hyponatremia: Secondary | ICD-10-CM | POA: Diagnosis not present

## 2023-09-19 DIAGNOSIS — G4730 Sleep apnea, unspecified: Secondary | ICD-10-CM | POA: Diagnosis not present

## 2023-09-19 DIAGNOSIS — M8580 Other specified disorders of bone density and structure, unspecified site: Secondary | ICD-10-CM | POA: Diagnosis not present

## 2023-09-19 DIAGNOSIS — I2510 Atherosclerotic heart disease of native coronary artery without angina pectoris: Secondary | ICD-10-CM | POA: Diagnosis not present

## 2023-09-19 DIAGNOSIS — R531 Weakness: Secondary | ICD-10-CM | POA: Diagnosis not present

## 2023-09-19 DIAGNOSIS — N183 Chronic kidney disease, stage 3 unspecified: Secondary | ICD-10-CM | POA: Diagnosis present

## 2023-09-19 DIAGNOSIS — F0280 Dementia in other diseases classified elsewhere without behavioral disturbance: Secondary | ICD-10-CM | POA: Diagnosis present

## 2023-09-19 DIAGNOSIS — M170 Bilateral primary osteoarthritis of knee: Secondary | ICD-10-CM | POA: Diagnosis not present

## 2023-09-19 LAB — I-STAT CG4 LACTIC ACID, ED: Lactic Acid, Venous: 0.8 mmol/L (ref 0.5–1.9)

## 2023-09-19 LAB — CBC WITH DIFFERENTIAL/PLATELET
Abs Immature Granulocytes: 0.36 10*3/uL — ABNORMAL HIGH (ref 0.00–0.07)
Basophils Absolute: 0 10*3/uL (ref 0.0–0.1)
Basophils Relative: 0 %
Eosinophils Absolute: 0 10*3/uL (ref 0.0–0.5)
Eosinophils Relative: 0 %
HCT: 32.2 % — ABNORMAL LOW (ref 36.0–46.0)
Hemoglobin: 9.6 g/dL — ABNORMAL LOW (ref 12.0–15.0)
Immature Granulocytes: 3 %
Lymphocytes Relative: 32 %
Lymphs Abs: 3.5 10*3/uL (ref 0.7–4.0)
MCH: 29.8 pg (ref 26.0–34.0)
MCHC: 29.8 g/dL — ABNORMAL LOW (ref 30.0–36.0)
MCV: 100 fL (ref 80.0–100.0)
Monocytes Absolute: 1.9 10*3/uL — ABNORMAL HIGH (ref 0.1–1.0)
Monocytes Relative: 18 %
Neutro Abs: 5 10*3/uL (ref 1.7–7.7)
Neutrophils Relative %: 47 %
Platelets: 248 10*3/uL (ref 150–400)
RBC: 3.22 MIL/uL — ABNORMAL LOW (ref 3.87–5.11)
RDW: 19 % — ABNORMAL HIGH (ref 11.5–15.5)
WBC: 10.9 10*3/uL — ABNORMAL HIGH (ref 4.0–10.5)
nRBC: 0 % (ref 0.0–0.2)

## 2023-09-19 LAB — URINALYSIS, W/ REFLEX TO CULTURE (INFECTION SUSPECTED)
Bilirubin Urine: NEGATIVE
Glucose, UA: NEGATIVE mg/dL
Hgb urine dipstick: NEGATIVE
Ketones, ur: NEGATIVE mg/dL
Nitrite: NEGATIVE
Protein, ur: NEGATIVE mg/dL
Specific Gravity, Urine: 1.017 (ref 1.005–1.030)
pH: 7 (ref 5.0–8.0)

## 2023-09-19 LAB — COMPREHENSIVE METABOLIC PANEL
ALT: 9 U/L (ref 0–44)
AST: 15 U/L (ref 15–41)
Albumin: 3.5 g/dL (ref 3.5–5.0)
Alkaline Phosphatase: 77 U/L (ref 38–126)
Anion gap: 11 (ref 5–15)
BUN: 34 mg/dL — ABNORMAL HIGH (ref 8–23)
CO2: 21 mmol/L — ABNORMAL LOW (ref 22–32)
Calcium: 9.2 mg/dL (ref 8.9–10.3)
Chloride: 107 mmol/L (ref 98–111)
Creatinine, Ser: 1.52 mg/dL — ABNORMAL HIGH (ref 0.44–1.00)
GFR, Estimated: 34 mL/min — ABNORMAL LOW (ref >60)
Glucose, Bld: 86 mg/dL (ref 70–99)
Potassium: 4.2 mmol/L (ref 3.5–5.1)
Sodium: 139 mmol/L (ref 135–145)
Total Bilirubin: 0.6 mg/dL (ref 0.0–1.2)
Total Protein: 7.2 g/dL (ref 6.5–8.1)

## 2023-09-19 LAB — CBG MONITORING, ED: Glucose-Capillary: 99 mg/dL (ref 70–99)

## 2023-09-19 MED ORDER — SODIUM CHLORIDE 0.9 % IV SOLN
1.0000 g | Freq: Once | INTRAVENOUS | Status: AC
  Administered 2023-09-19: 1 g via INTRAVENOUS
  Filled 2023-09-19: qty 10
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
## 2023-09-19 NOTE — Progress Notes (Addendum)
Consult for SNF CSW has advised provider  that PT/OT  needed to be ordered. TOC will follow up once pt is seen, for possible SNF placement.  [place] Shirla Hodgkiss LCSW-A   09/19/2023 7:02 PM

## 2023-09-19 NOTE — H&P (Signed)
History and Physical    Patient: Allison Chavez BMW:[id number] DOB: 1940/09/16 DOA: 09/19/2023 DOS: the patient was seen and examined on 09/19/2023 PCP: Shade Flood, MD  Patient coming from: Home  Chief Complaint:  Chief Complaint  Patient presents with   Altered Mental Status   HPI: Allison Chavez is a 83 y.o. female with medical history significant of dementia, degenerative disc disease, insomnia, major depression, mild renal insufficiency, hyperlipidemia, essential hypertension, chronic kidney disease stage III who presents to the ER with altered mental status, dysuria and weakness.  She was noted to have dysuria a few days ago.  Was attempted to treat her with antibiotics.  Patient unable to give adequate history.  Patient's workup showed evidence of UTI.  Patient appears to be having acute metabolic encephalopathy secondary to UTI.  She is being admitted for further evaluation and treatment.  Review of Systems: As mentioned in the history of present illness. All other systems reviewed and are negative. Past Medical History:  Diagnosis Date   Alcohol abuse 12/25/2017   Cataract    CKD (chronic kidney disease), stage III 12/25/2017   Degenerative retinal drusen of left eye 12/08/2019   Degenerative retinal drusen of right eye 12/08/2019   Dementia due to Alzheimer's disease 12/19/2021   DJD (degenerative joint disease) of cervical spine    DJD of shoulder, right    Generalized anxiety disorder    HTN (hypertension) 03/07/2012   Hyperlipidemia 01/11/2015   Hypokalemia 12/25/2017   Hyponatremia 12/25/2017   Insomnia 10/08/2013   Intermediate stage nonexudative age-related macular degeneration of both eyes 12/08/2019   Major depressive disorder    Osteopenia 01/11/2015   Updated DEXA 01/17/2015. Osteopenia. Stable since 11/24/2002.   Pneumonia    as a child   Pseudophakia of both eyes 12/08/2019   Renal insufficiency, mild    Senile entropion of right lower eyelid 06/07/2020    Sigmoid diverticulosis 01/08/2017   Noted on colonoscopy   Vitreous prolapse of left eye 12/08/2019   Past Surgical History:  Procedure Laterality Date   CLAVICLE SURGERY Right    from [facility]   COLONOSCOPY     EYE SURGERY Bilateral    cataract surgery with lens implants   HARDWARE REMOVAL Left 12/09/2014   Procedure: LEFT SHOULDER HARDWARE REMOVAL;  Surgeon: Francena Hanly, MD;  Location: [facility] OR;  Service: Orthopedics;  Laterality: Left;   ORIF SHOULDER FRACTURE Left 09/23/2014   Procedure: OPEN REDUCTION INTERNAL FIXATION (ORIF) SHOULDER FRACTURE;  Surgeon: Senaida Lange, MD;  Location: [facility] OR;  Service: Orthopedics;  Laterality: Left;   TONSILLECTOMY     adenoids- childhood   Social History:  reports that she quit smoking about 21 years ago. Her smoking use included cigarettes. She started smoking about 66 years ago. She has a 13.5 pack-year smoking history. She has never used smokeless tobacco. She reports current alcohol use. She reports that she does not use drugs.  Allergies  Allergen Reactions   Ace Inhibitors Cough    Cough    Sulfa Antibiotics Hives   Wellbutrin [Bupropion] Rash    Only with generic, immediate release    Family History  Problem Relation Age of Onset   Cancer Father        Brain   Diverticulitis Father    Cancer Maternal Grandfather    Stroke Paternal Grandmother    Heart disease Paternal Grandfather    Heart attack Brother    Cancer Brother  colon   Diabetes Brother     Prior to Admission medications   Medication Sig Start Date End Date Taking? Authorizing Provider  citalopram (CELEXA) 20 MG tablet Take 1 tablet (20 mg total) by mouth daily. Patient not taking: Reported on 08/15/2023 10/02/21   Shade Flood, MD  folic acid (FOLVITE) 1 MG tablet Take 1 tablet (1 mg total) by mouth daily. Patient not taking: Reported on 08/15/2023 12/28/17   Albertine Grates, MD  hydrOXYzine (ATARAX) 25 MG tablet Take 0.5 tablets (12.5 mg total) by mouth at bedtime as  needed for anxiety. Patient not taking: Reported on 08/15/2023 12/05/21   Shade Flood, MD  memantine (NAMENDA) 10 MG tablet Take 1 tablet (10 mg at night) for 2 weeks, then increase to 1 tablet (10 mg) twice a day Patient not taking: Reported on 08/15/2023 12/19/21   Marcos Eke, PA-C  Multiple Vitamins-Minerals (PRESERVISION AREDS 2+MULTI VIT) CAPS Take by mouth daily. Patient not taking: Reported on 08/15/2023    [provider]  Multiple Vitamins-Minerals (WOMENS ONE DAILY) TABS Take 1 tablet by mouth 1 day or 1 dose. Patient not taking: Reported on 08/15/2023    [provider]  thiamine 100 MG tablet Take 1 tablet (100 mg total) by mouth daily. Patient not taking: Reported on 08/15/2023 12/28/17   Albertine Grates, MD    Physical Exam: Vitals:   09/19/23 1354 09/19/23 1555 09/19/23 1715 09/19/23 2026  BP: (!) 163/78 138/60 (!) 147/98 (!) 157/75  Pulse: 84 84 86 74  Resp: 20 16 20 12   Temp: 98.3 F (36.8 C) 98.4 F (36.9 C)  98.6 F (37 C)  TempSrc: Oral Oral  Oral  SpO2: 100% 96% 99% 95%  Weight:      Height:       Constitutional: Confused, no agitation NAD, calm, comfortable Eyes: PERRL, lids and conjunctivae normal ENMT: Mucous membranes are dry.  Posterior pharynx clear of any exudate or lesions.Normal dentition.  Neck: normal, supple, no masses, no thyromegaly Respiratory: clear to auscultation bilaterally, no wheezing, no crackles. Normal respiratory effort. No accessory muscle use.  Cardiovascular: Regular rate and rhythm, no murmurs / rubs / gallops. No extremity edema. 2+ pedal pulses. No carotid bruits.  Abdomen: no tenderness, no masses palpated. No hepatosplenomegaly. Bowel sounds positive.  Musculoskeletal: Good range of motion, no joint swelling or tenderness, Skin: no rashes, lesions, ulcers. No induration Neurologic: CN 2-12 grossly intact. Sensation intact, DTR normal. Strength 5/5 in all 4.  Psychiatric: Confused but alert, normal mood  Data  Reviewed:  Blood pressure 160/78, creatinine 1.52, BUN 34, white count 10.9 hemoglobin 9.8.  Urinalysis shows leuks in the urine, shows bacteriuria leukocytosis 11-20 head CT without contrast is negative.  Chest x-ray showed no acute findings  Assessment and Plan:  #1 acute metabolic encephalopathy: Secondary to UTI.  Patient will be admitted.  Initiate treatment for UTI.  Await cultures and sensitivities.  #2 UTI: Patient will be on IV Rocephin.  Urine culture and sensitivity sent out.  Monitor closely.  #3 dementia: Has Alzheimer's dementia.  Currently no agitation.  Continue to monitor.  #4 essential hypertension: Continue with blood pressure monitoring.  Continue home regimen  #5 hyperlipidemia: Continue home statin  #6 chronic kidney disease stage III: Continue with supportive care.  Appears to be at baseline  #7 anemia of chronic kidney disease: Stable.  #8 major depressive disorder: Confirm on resume home regimen  #9 adult failure to thrive: Patient unable to take care  of herself at home.  Plan is to send patient to skilled facility.  Case management already working on it.    Advance Care Planning:   Code Status: Full Code   Consults: None  Family Communication: No family at bedside  Severity of Illness: The appropriate patient status for this patient is INPATIENT. Inpatient status is judged to be reasonable and necessary in order to provide the required intensity of service to ensure the patient's safety. The patient's presenting symptoms, physical exam findings, and initial radiographic and laboratory data in the context of their chronic comorbidities is felt to place them at high risk for further clinical deterioration. Furthermore, it is not anticipated that the patient will be medically stable for discharge from the hospital within 2 midnights of admission.   * I certify that at the point of admission it is my clinical judgment that the patient will require inpatient  hospital care spanning beyond 2 midnights from the point of admission due to high intensity of service, high risk for further deterioration and high frequency of surveillance required.*  AuthorLonia Blood, MD 09/19/2023 10:04 PM  For on call review [email or web address].

## 2023-09-19 NOTE — ED Notes (Signed)
Pt not in room. Taken to scan

## 2023-09-19 NOTE — ED Provider Notes (Signed)
I was asked to follow-up on the patient's remaining labs including her urinalysis.  The patient does appear to have a UTI.  Back to her chart it does appear that she was just seen in urgent care a few days ago for dysuria.  This was not treated.  It does seem that she has had an acute worsening over the past few days so we will treat her with Rocephin.  Calls placed hospital service for admission for this.  Physical Exam  BP (!) 157/75 (BP Location: Right Arm)   Pulse 74   Temp 98.6 F (37 C) (Oral)   Resp 12   Ht 5\' 1"  (1.549 m)   Wt 59 kg   SpO2 95%   BMI 24.58 kg/m   Physical Exam General: No acute distress  Procedures  Procedures  ED Course / MDM   Clinical Course as of 09/19/23 2202  Thu Sep 19, 2023  1359 Other tenant in house and occupational therpy called EMS. Roger Sebastiani is husband, but does not live with patient. Friend of husband; Phil usually takes care of doc appt. Mrs. Flynt notes for the past 3 days has been AMS/confused and less active. Last week: fall x3. Recently started on home PT/OCT. Went to [facility] Friday for possible UTI, but did not provide specimen; no antibiotics. Gave prednisone for rash. Baseline; ambulatory, somewhat able to have conversion, but has trouble with ADLs.   [TY]  1452 CT Head Wo Contrast MPRESSION: 1. Stable head CT.  No evidence of acute intracranial abnormality. 2.  Cerebral Atrophy (ICD10-G31.9).   Electronically Signed   [TY]    Clinical Course User Index [TY] Coral Spikes, DO   Medical Decision Making Amount and/or Complexity of Data Reviewed Labs: ordered. Radiology: ordered. Decision-making details documented in ED Course. ECG/medicine tests: ordered.  Risk Decision regarding hospitalization.          Durwin Glaze, MD 09/19/23 2203

## 2023-09-19 NOTE — ED Triage Notes (Signed)
Pt is coming from home. She lives with a tenant who lives in the same home. Tenant comes up to check on her every few hrs. She has days and nights confused. Been getting progressively more confused. Was using walker at baseline. Hasn't lately. Multiple falls recently. Loss of left hand use and it is now contracted noticed about 2 months ago. BP 171/80, HR 90, O2 96% CBG 125.   TenantAssunta Gambles [phone] She thinks pt needs placement in a nursing facility.  Husband left. She also has kids but it doesn't sound like they are in contact with her.

## 2023-09-19 NOTE — ED Provider Notes (Signed)
[place] EMERGENCY DEPARTMENT AT [facility] Provider Note   CSN: [id number] Arrival date & time: 09/19/23  1333     History  Chief Complaint  Patient presents with   Altered Mental Status    Allison Chavez is a 83 y.o. female.  This is an 83 year old female presenting emergency department for altered mental status.  She is alert to self and place, not to time.  She has no specific complaints and states she is here for a checkup.  Discussed with tenant that lives with her Weston Settle who has been helping care for her.  See ED course.   Altered Mental Status      Home Medications Prior to Admission medications   Medication Sig Start Date End Date Taking? Authorizing Provider  citalopram (CELEXA) 20 MG tablet Take 1 tablet (20 mg total) by mouth daily. Patient not taking: Reported on 08/15/2023 10/02/21   Shade Flood, MD  folic acid (FOLVITE) 1 MG tablet Take 1 tablet (1 mg total) by mouth daily. Patient not taking: Reported on 08/15/2023 12/28/17   Albertine Grates, MD  hydrOXYzine (ATARAX) 25 MG tablet Take 0.5 tablets (12.5 mg total) by mouth at bedtime as needed for anxiety. Patient not taking: Reported on 08/15/2023 12/05/21   Shade Flood, MD  memantine (NAMENDA) 10 MG tablet Take 1 tablet (10 mg at night) for 2 weeks, then increase to 1 tablet (10 mg) twice a day Patient not taking: Reported on 08/15/2023 12/19/21   Marcos Eke, PA-C  Multiple Vitamins-Minerals (PRESERVISION AREDS 2+MULTI VIT) CAPS Take by mouth daily. Patient not taking: Reported on 08/15/2023    [provider]  Multiple Vitamins-Minerals (WOMENS ONE DAILY) TABS Take 1 tablet by mouth 1 day or 1 dose. Patient not taking: Reported on 08/15/2023    [provider]  thiamine 100 MG tablet Take 1 tablet (100 mg total) by mouth daily. Patient not taking: Reported on 08/15/2023 12/28/17   Albertine Grates, MD      Allergies    Ace inhibitors, Sulfa antibiotics, and Wellbutrin [bupropion]     Review of Systems   Review of Systems  Physical Exam Updated Vital Signs BP (!) 163/78 (BP Location: Right Arm)   Pulse 84   Temp 98.3 F (36.8 C) (Oral)   Resp 20   Ht 5\' 1"  (1.549 m)   Wt 59 kg   SpO2 100%   BMI 24.58 kg/m  Physical Exam Vitals and nursing note reviewed.  Constitutional:      General: She is not in acute distress.    Appearance: She is not toxic-appearing.  HENT:     Head: Normocephalic.     Nose: Nose normal.     Mouth/Throat:     Mouth: Mucous membranes are moist.  Eyes:     Conjunctiva/sclera: Conjunctivae normal.  Cardiovascular:     Rate and Rhythm: Normal rate and regular rhythm.  Pulmonary:     Effort: Pulmonary effort is normal.     Breath sounds: Normal breath sounds.  Abdominal:     General: Abdomen is flat. There is no distension.     Palpations: Abdomen is soft.     Tenderness: There is no abdominal tenderness. There is no guarding or rebound.  Musculoskeletal:     Comments: No obvious trauma.  Left hand contracted.  Skin:    General: Skin is warm and dry.     Capillary Refill: Capillary refill takes less than 2 seconds.  Neurological:  General: No focal deficit present.     Mental Status: She is alert.     Cranial Nerves: No cranial nerve deficit.     Sensory: No sensory deficit.     Motor: No weakness.     Coordination: Coordination normal.  Psychiatric:        Mood and Affect: Mood normal.        Behavior: Behavior normal.     ED Results / Procedures / Treatments   Labs (all labs ordered are listed, but only abnormal results are displayed) Labs Reviewed  CBC WITH DIFFERENTIAL/PLATELET  COMPREHENSIVE METABOLIC PANEL  URINALYSIS, W/ REFLEX TO CULTURE (INFECTION SUSPECTED)  CBG MONITORING, ED  I-STAT CG4 LACTIC ACID, ED    EKG None  Radiology DG Chest Port 1 View Result Date: 09/19/2023 CLINICAL DATA:  Altered mental status. EXAM: PORTABLE CHEST 1 VIEW COMPARISON:  June 26, 2023. FINDINGS: Stable  cardiomegaly. Both lungs are clear. Old left rib fractures are noted. IMPRESSION: No active disease. Electronically Signed   By: Lupita Raider M.D.   On: 09/19/2023 14:58   CT Head Wo Contrast Result Date: 09/19/2023 CLINICAL DATA:  Mental status change, unknown cause EXAM: CT HEAD WITHOUT CONTRAST TECHNIQUE: Contiguous axial images were obtained from the base of the skull through the vertex without intravenous contrast. RADIATION DOSE REDUCTION: This exam was performed according to the departmental dose-optimization program which includes automated exposure control, adjustment of the mA and/or kV according to patient size and/or use of iterative reconstruction technique. COMPARISON:  CT head 07/03/2023. FINDINGS: Brain: No evidence of acute infarction, hemorrhage, hydrocephalus, extra-axial collection or mass lesion/mass effect. Similar cerebral atrophy with ex vacuo ventricular dilation. Similar patchy white matter hypodensities, compatible with chronic microvascular ischemic disease. Vascular: No hyperdense vessel identified. Skull: No acute fracture. Sinuses/Orbits: Clear sinuses.  No acute findings. Other: No mastoid effusions. IMPRESSION: 1. Stable head CT.  No evidence of acute intracranial abnormality. 2.  Cerebral Atrophy (ICD10-G31.9). Electronically Signed   By: Feliberto Harts M.D.   On: 09/19/2023 14:42    Procedures Procedures    Medications Ordered in ED Medications - No data to display  ED Course/ Medical Decision Making/ A&P Clinical Course as of 09/19/23 1501  Thu Sep 19, 2023  1359 Other tenant in house and occupational therpy called EMS. Roger Schabel is husband, but does not live with patient. Friend of husband; Phil usually takes care of doc appt. Mrs. Flynt notes for the past 3 days has been AMS/confused and less active. Last week: fall x3. Recently started on home PT/OCT. Went to [facility] Friday for possible UTI, but did not provide specimen; no antibiotics. Gave prednisone for  rash. Baseline; ambulatory, somewhat able to have conversion, but has trouble with ADLs.   [TY]  1452 CT Head Wo Contrast MPRESSION: 1. Stable head CT.  No evidence of acute intracranial abnormality. 2.  Cerebral Atrophy (ICD10-G31.9).   Electronically Signed   [TY]    Clinical Course User Index [TY] Coral Spikes, DO                                 Medical Decision Making Is an 82 year old female with dementia presenting emergency department for altered mental status reportedly for the past 3 days.  She is afebrile nontachycardic maintaining oxygen saturation on room air.  Slightly hypertensive.  Exam without overt causes of infection.  Tenant who lives with her is concerned for  possible urinary tract infection.  Will get CT head to evaluate for acute intracranial pathology as well as labs.  Care signed out to afternoon team; dispo pending completion of workup.   Amount and/or Complexity of Data Reviewed Independent Historian:     Details: See ED course. External Data Reviewed:     Details: Test take citalopram, Atarax, Namenda.  Has had recent falls.  No documented blood thinners. Labs: ordered. Decision-making details documented in ED Course. Radiology: ordered. Decision-making details documented in ED Course. ECG/medicine tests: ordered. Decision-making details documented in ED Course.  Risk Decision regarding hospitalization.         Final Clinical Impression(s) / ED Diagnoses Final diagnoses:  None    Rx / DC Orders ED Discharge Orders     None         Coral Spikes, DO 09/19/23 1501

## 2023-09-20 ENCOUNTER — Telehealth: Payer: Self-pay | Admitting: Family Medicine

## 2023-09-20 DIAGNOSIS — G9341 Metabolic encephalopathy: Secondary | ICD-10-CM | POA: Diagnosis not present

## 2023-09-20 LAB — COMPREHENSIVE METABOLIC PANEL
ALT: 11 U/L (ref 0–44)
AST: 12 U/L — ABNORMAL LOW (ref 15–41)
Albumin: 3.9 g/dL (ref 3.5–5.0)
Alkaline Phosphatase: 86 U/L (ref 38–126)
Anion gap: 13 (ref 5–15)
BUN: 28 mg/dL — ABNORMAL HIGH (ref 8–23)
CO2: 22 mmol/L (ref 22–32)
Calcium: 9.3 mg/dL (ref 8.9–10.3)
Chloride: 102 mmol/L (ref 98–111)
Creatinine, Ser: 1.35 mg/dL — ABNORMAL HIGH (ref 0.44–1.00)
GFR, Estimated: 40 mL/min — ABNORMAL LOW (ref >60)
Glucose, Bld: 89 mg/dL (ref 70–99)
Potassium: 3.9 mmol/L (ref 3.5–5.1)
Sodium: 137 mmol/L (ref 135–145)
Total Bilirubin: 0.9 mg/dL (ref 0.0–1.2)
Total Protein: 7.6 g/dL (ref 6.5–8.1)

## 2023-09-20 LAB — CBC
HCT: 32.1 % — ABNORMAL LOW (ref 36.0–46.0)
Hemoglobin: 10.4 g/dL — ABNORMAL LOW (ref 12.0–15.0)
MCH: 31 pg (ref 26.0–34.0)
MCHC: 32.4 g/dL (ref 30.0–36.0)
MCV: 95.8 fL (ref 80.0–100.0)
Platelets: 270 10*3/uL (ref 150–400)
RBC: 3.35 MIL/uL — ABNORMAL LOW (ref 3.87–5.11)
RDW: 18.8 % — ABNORMAL HIGH (ref 11.5–15.5)
WBC: 8.9 10*3/uL (ref 4.0–10.5)
nRBC: 0 % (ref 0.0–0.2)

## 2023-09-20 MED ORDER — DEXTROSE IN LACTATED RINGERS 5 % IV SOLN: INTRAVENOUS | Status: AC

## 2023-09-20 MED ORDER — SODIUM CHLORIDE 0.9 % IV SOLN
1.0000 g | INTRAVENOUS | Status: DC
  Administered 2023-09-20 – 2023-09-21 (×2): 1 g via INTRAVENOUS
  Filled 2023-09-20 (×3): qty 10

## 2023-09-20 MED ORDER — ONDANSETRON HCL 4 MG/2ML IJ SOLN: 4.0000 mg | Freq: Four times a day (QID) | INTRAMUSCULAR | Status: DC | PRN

## 2023-09-20 MED ORDER — ACETAMINOPHEN 325 MG PO TABS
650.0000 mg | ORAL_TABLET | Freq: Four times a day (QID) | ORAL | Status: DC | PRN
  Administered 2023-09-21: 650 mg via ORAL
  Filled 2023-09-20: qty 2

## 2023-09-20 MED ORDER — ACETAMINOPHEN 650 MG RE SUPP: 650.0000 mg | Freq: Four times a day (QID) | RECTAL | Status: DC | PRN

## 2023-09-20 MED ORDER — SODIUM CHLORIDE 0.9 % IV SOLN: 1.0000 g | INTRAVENOUS | Status: DC

## 2023-09-20 MED ORDER — ONDANSETRON HCL 4 MG PO TABS: 4.0000 mg | ORAL_TABLET | Freq: Four times a day (QID) | ORAL | Status: DC | PRN

## 2023-09-20 MED ORDER — ENOXAPARIN SODIUM 30 MG/0.3ML IJ SOSY
30.0000 mg | PREFILLED_SYRINGE | INTRAMUSCULAR | Status: DC
  Administered 2023-09-20 – 2023-09-22 (×3): 30 mg via SUBCUTANEOUS
  Filled 2023-09-20 (×3): qty 0.3

## 2023-09-20 NOTE — Plan of Care (Signed)

## 2023-09-20 NOTE — ED Notes (Signed)
Pt pulled IV out, states it was bothering her. Pt also naked. Provided clean gown and changed brief. Pt is now eating lunch.

## 2023-09-20 NOTE — Telephone Encounter (Signed)
Home Health Certification or Plan of Care Tracking  Is this a Certification or Plan of Care? Plan of Care  [facility] Agency: [facility]  Order Number:  [id number]  Has charge sheet been attached? Yes  Where has form been placed:   Labeled & placed in provider bin

## 2023-09-20 NOTE — Evaluation (Signed)
Physical Therapy Evaluation Patient Details Name: Allison Chavez MRN: [id number] DOB: July 31, 1940 Today's Date: 09/20/2023  History of Present Illness  83 y.o. female who presents to the ER with altered mental status, dysuria and weakness.with medical history significant of dementia, degenerative disc disease, insomnia, major depression, mild renal insufficiency, hyperlipidemia, essential hypertension, chronic kidney disease stage III.  Clinical Impression  Pt admitted with above diagnosis. Pt is a poor historian, she is pleasantly confused, oriented to self only (stated current year is 62). Unclear what her baseline is with mobility, no family present. Max assist for supine to sit. Mod assist for sit to stand. Pt was able to take a few side steps at edge of bed with RUE on walker, L hand has 3rd and 4th fingers in flexion contracture. Did not attempt to step away from the bed due to poor standing balance. Patient will benefit from continued inpatient follow up therapy, <3 hours/day.  Pt currently with functional limitations due to the deficits listed below (see PT Problem List). Pt will benefit from acute skilled PT to increase their independence and safety with mobility to allow discharge.             If plan is discharge home, recommend the following: A lot of help with walking and/or transfers;A lot of help with bathing/dressing/bathroom;Assistance with cooking/housework;Direct supervision/assist for financial management;Assist for transportation;Help with stairs or ramp for entrance   Can travel by private vehicle   No    Equipment Recommendations None recommended by PT  Recommendations for Other Services       Functional Status Assessment Patient has had a recent decline in their functional status and demonstrates the ability to make significant improvements in function in a reasonable and predictable amount of time.     Precautions / Restrictions Precautions Precautions:  Fall Recall of Precautions/Restrictions: Impaired Precaution/Restrictions Comments: oriented to self only Restrictions Weight Bearing Restrictions Per Provider Order: No      Mobility  Bed Mobility Overal bed mobility: Needs Assistance Bed Mobility: Supine to Sit, Sit to Supine     Supine to sit: Max assist Sit to supine: Total assist   General bed mobility comments: assist to raise trunk, pivot hips and advance BLEs    Transfers Overall transfer level: Needs assistance Equipment used: Rolling walker (2 wheels) Transfers: Sit to/from Stand Sit to Stand: Mod assist, From elevated surface           General transfer comment: mod A to power up from elevated ED bed, pt only able to grip RW with RUE (L hand fingers contracted)    Ambulation/Gait Ambulation/Gait assistance: Min assist Gait Distance (Feet): 2 Feet Assistive device: Rolling walker (2 wheels) Gait Pattern/deviations: Step-to pattern       General Gait Details: side steps x 5 with RW and min A for balance  Stairs            Wheelchair Mobility     Tilt Bed    Modified Rankin (Stroke Patients Only)       Balance Overall balance assessment: Needs assistance Sitting-balance support: Feet supported, Single extremity supported Sitting balance-Leahy Scale: Fair     Standing balance support: Single extremity supported, During functional activity, Reliant on assistive device for balance Standing balance-Leahy Scale: Poor                               Pertinent Vitals/Pain Pain Assessment Pain Assessment: Faces Breathing: occasional  labored breathing, short period of hyperventilation Facial Expression: sad, frightened, frown Body Language: tense, distressed pacing, fidgeting Consolability: distracted or reassured by voice/touch Pain Location: R "ribs" with movement Pain Descriptors / Indicators: Guarding, Grimacing Pain Intervention(s): Limited activity within patient's  tolerance, Monitored during session, Repositioned    Home Living Family/patient expects to be discharged to:: Skilled nursing facility                        Prior Function Prior Level of Function : Needs assist;Patient poor historian/Family not available             Mobility Comments: unknown, pt poor historian, no family present. Pt stated she uses a walker but her L fingers are contracted so not likely she could grasp a walker       Extremity/Trunk Assessment   Upper Extremity Assessment Upper Extremity Assessment: Difficult to assess due to impaired cognition;LUE deficits/detail;Defer to OT evaluation LUE Deficits / Details: noted 3rd and 4th fingers to be in flexion contracture, pt unable to open fingers    Lower Extremity Assessment Lower Extremity Assessment: Difficult to assess due to impaired cognition;Generalized weakness;RLE deficits/detail;LLE deficits/detail RLE Deficits / Details: knee ext 4/5 LLE Deficits / Details: knee ext +3/5    Cervical / Trunk Assessment Cervical / Trunk Assessment: Kyphotic  Communication   Communication Communication: No apparent difficulties    Cognition Arousal: Alert Behavior During Therapy: WFL for tasks assessed/performed   PT - Cognitive impairments: History of cognitive impairments, No family/caregiver present to determine baseline                       PT - Cognition Comments: oriented to self only, able to state name and birthdate. Stated the current year is 1967, that she currently works at [employer], current location is "the front office". Following commands: Impaired Following commands impaired: Follows one step commands with increased time     Cueing Cueing Techniques: Verbal cues, Tactile cues     General Comments      Exercises     Assessment/Plan    PT Assessment Patient needs continued PT services  PT Problem List Decreased strength;Decreased mobility;Decreased balance;Decreased activity  tolerance;Pain       PT Treatment Interventions DME instruction;Gait training;Therapeutic activities;Functional mobility training;Therapeutic exercise    PT Goals (Current goals can be found in the Care Plan section)  Acute Rehab PT Goals PT Goal Formulation: Patient unable to participate in goal setting Time For Goal Achievement: 10/04/23 Potential to Achieve Goals: Fair    Frequency Min 1X/week     Co-evaluation               AM-PAC PT "6 Clicks" Mobility  Outcome Measure Help needed turning from your back to your side while in a flat bed without using bedrails?: A Lot Help needed moving from lying on your back to sitting on the side of a flat bed without using bedrails?: A Lot Help needed moving to and from a bed to a chair (including a wheelchair)?: A Lot Help needed standing up from a chair using your arms (e.g., wheelchair or bedside chair)?: A Lot Help needed to walk in hospital room?: A Lot Help needed climbing 3-5 steps with a railing? : Total 6 Click Score: 11    End of Session Equipment Utilized During Treatment: Gait belt Activity Tolerance: Patient limited by pain;Patient limited by fatigue Patient left: in bed;with call bell/phone within reach Nurse Communication:  Mobility status PT Visit Diagnosis: Unsteadiness on feet (R26.81);Muscle weakness (generalized) (M62.81);Other abnormalities of gait and mobility (R26.89)    Time: 7846-9629 PT Time Calculation (min) (ACUTE ONLY): 21 min   Charges:   PT Evaluation $PT Eval Moderate Complexity: 1 Mod   PT General Charges $$ ACUTE PT VISIT: 1 Visit         Tamala Ser PT 09/20/2023  Acute Rehabilitation Services  Office [phone]

## 2023-09-20 NOTE — Progress Notes (Signed)
PROGRESS NOTE    Allison Chavez  ONG:[id number] DOB: 02/18/1941 DOA: 09/19/2023 PCP: Shade Flood, MD  Outpatient Specialists:     Brief Narrative:  Patient is an 83 year old female, with past medical history significant for dementia, depression, chronic kidney disease stage III, hypertension, and hyperlipidemia.  Patient was admitted with acute metabolic encephalopathy, likely secondary to UTI.  09/20/2023: Patient seen.  No significant history from patient.   Assessment & Plan:   Principal Problem:   Acute metabolic encephalopathy Active Problems:   HTN (hypertension)   Hyperlipidemia   CKD (chronic kidney disease), stage III   Dementia due to Alzheimer's disease   Major depressive disorder   FTT (failure to thrive) in adult   Anemia in chronic kidney disease (CKD)   Acute metabolic encephalopathy:  -Likely secondary to UTI.   -Patient may have been volume depleted as well. -Follow urine cultures -Continue IV fluids -R renal function and electrolytes.  UTI:  -UA revealed many bacteria and leukocytes. -Patient is on IV Rocephin. -Follow urine cultures.    Dementia:  -No behavioral problems.     Essential hypertension:  -Continue to monitor and manage expectantly.    Hyperlipidemia: -Continue home statin   Chronic kidney disease stage IIIb:  -Stable. -Continue to monitor renal function and electrolytes.     Anemia: -Stable. -Likely anemia of chronic inflammation. -Patient has chronic kidney disease.   Major depressive disorder:    Adult failure to thrive: Patient unable to take care of herself at home.  Plan is to send patient to skilled facility.  Case management already working on it.   DVT prophylaxis: Subcutaneous Lovenox. Code Status:  Family Communication:  Disposition Plan: Patient remains inpatient.   Consultants:  None.  Procedures:  None.  Antimicrobials:  IV Rocephin.   Subjective: Significant history from  patient.  Objective: Vitals:   09/20/23 1200 09/20/23 1400 09/20/23 1500 09/20/23 1600  BP: (!) 155/65 (!) 162/62 (!) 149/65 (!) 146/53  Pulse: 65 73 68 78  Resp: 17 18 18 17   Temp:   97.7 F (36.5 C) 98.2 F (36.8 C)  TempSrc:    Oral  SpO2: 96% 97% 97% 99%  Weight:      Height:        Intake/Output Summary (Last 24 hours) at 09/20/2023 1745 Last data filed at 09/20/2023 1727 Gross per 24 hour  Intake 283.23 ml  Output --  Net 283.23 ml   Filed Weights   09/19/23 1349  Weight: 59 kg    Examination:  General exam: Appears calm and comfortable  Respiratory system: Clear to auscultation. . Cardiovascular system: S1 & S2 Gastrointestinal system: Abdomen is soft and nontender. Central nervous system: Awake and alert.  Patient is demented.   Extremities: No leg edema.  Data Reviewed: I have personally reviewed following labs and imaging studies  CBC: Recent Labs  Lab 09/19/23 1501 09/20/23 0812  WBC 10.9* 8.9  NEUTROABS 5.0  --   HGB 9.6* 10.4*  HCT 32.2* 32.1*  MCV 100.0 95.8  PLT 248 270   Basic Metabolic Panel: Recent Labs  Lab 09/19/23 1501 09/20/23 0812  NA 139 137  K 4.2 3.9  CL 107 102  CO2 21* 22  GLUCOSE 86 89  BUN 34* 28*  CREATININE 1.52* 1.35*  CALCIUM 9.2 9.3   GFR: Estimated Creatinine Clearance: 27.4 mL/min (A) (by C-G formula based on SCr of 1.35 mg/dL (H)). Liver Function Tests: Recent Labs  Lab 09/19/23 1501 09/20/23 4401  AST 15 12*  ALT 9 11  ALKPHOS 77 86  BILITOT 0.6 0.9  PROT 7.2 7.6  ALBUMIN 3.5 3.9   No results for input(s): "LIPASE", "AMYLASE" in the last 168 hours. No results for input(s): "AMMONIA" in the last 168 hours. Coagulation Profile: No results for input(s): "INR", "PROTIME" in the last 168 hours. Cardiac Enzymes: No results for input(s): "CKTOTAL", "CKMB", "CKMBINDEX", "TROPONINI" in the last 168 hours. BNP (last 3 results) No results for input(s): "PROBNP" in the last 8760 hours. HbA1C: No results  for input(s): "HGBA1C" in the last 72 hours. CBG: Recent Labs  Lab 09/19/23 1358  GLUCAP 99   Lipid Profile: No results for input(s): "CHOL", "HDL", "LDLCALC", "TRIG", "CHOLHDL", "LDLDIRECT" in the last 72 hours. Thyroid Function Tests: No results for input(s): "TSH", "T4TOTAL", "FREET4", "T3FREE", "THYROIDAB" in the last 72 hours. Anemia Panel: No results for input(s): "VITAMINB12", "FOLATE", "FERRITIN", "TIBC", "IRON", "RETICCTPCT" in the last 72 hours. Urine analysis:    Component Value Date/Time   COLORURINE YELLOW 09/19/2023 1414   APPEARANCEUR HAZY (A) 09/19/2023 1414   APPEARANCEUR Clear 09/26/2017 1341   LABSPEC 1.017 09/19/2023 1414   PHURINE 7.0 09/19/2023 1414   GLUCOSEU NEGATIVE 09/19/2023 1414   HGBUR NEGATIVE 09/19/2023 1414   BILIRUBINUR NEGATIVE 09/19/2023 1414   BILIRUBINUR Negative 09/26/2017 1341   KETONESUR NEGATIVE 09/19/2023 1414   PROTEINUR NEGATIVE 09/19/2023 1414   UROBILINOGEN 0.2 01/22/2017 1544   UROBILINOGEN 0.2 07/28/2016 1913   NITRITE NEGATIVE 09/19/2023 1414   LEUKOCYTESUR MODERATE (A) 09/19/2023 1414   Sepsis Labs: @LABRCNTIP (procalcitonin:4,lacticidven:4)  )No results found for this or any previous visit (from the past 240 hours).       Radiology Studies: [facility] Chest Port 1 View Result Date: 09/19/2023 CLINICAL DATA:  Altered mental status. EXAM: PORTABLE CHEST 1 VIEW COMPARISON:  June 26, 2023. FINDINGS: Stable cardiomegaly. Both lungs are clear. Old left rib fractures are noted. IMPRESSION: No active disease. Electronically Signed   By: Lupita Raider M.D.   On: 09/19/2023 14:58   CT Head Wo Contrast Result Date: 09/19/2023 CLINICAL DATA:  Mental status change, unknown cause EXAM: CT HEAD WITHOUT CONTRAST TECHNIQUE: Contiguous axial images were obtained from the base of the skull through the vertex without intravenous contrast. RADIATION DOSE REDUCTION: This exam was performed according to the departmental dose-optimization program  which includes automated exposure control, adjustment of the mA and/or kV according to patient size and/or use of iterative reconstruction technique. COMPARISON:  CT head 07/03/2023. FINDINGS: Brain: No evidence of acute infarction, hemorrhage, hydrocephalus, extra-axial collection or mass lesion/mass effect. Similar cerebral atrophy with ex vacuo ventricular dilation. Similar patchy white matter hypodensities, compatible with chronic microvascular ischemic disease. Vascular: No hyperdense vessel identified. Skull: No acute fracture. Sinuses/Orbits: Clear sinuses.  No acute findings. Other: No mastoid effusions. IMPRESSION: 1. Stable head CT.  No evidence of acute intracranial abnormality. 2.  Cerebral Atrophy (ICD10-G31.9). Electronically Signed   By: Feliberto Harts M.D.   On: 09/19/2023 14:42        Scheduled Meds:  enoxaparin (LOVENOX) injection  30 mg Subcutaneous Q24H   Continuous Infusions:  cefTRIAXone (ROCEPHIN)  IV     dextrose 5% lactated ringers 40 mL/hr at 09/20/23 1727     LOS: 1 day    Time spent: 35 minutes.    Berton Mount, MD  [facility] Pager #: [phone] 7PM-7AM contact night coverage as above

## 2023-09-20 NOTE — Telephone Encounter (Signed)
 Paperwork has been collected and placed in PCPs sign folder

## 2023-09-21 DIAGNOSIS — G9341 Metabolic encephalopathy: Secondary | ICD-10-CM | POA: Diagnosis not present

## 2023-09-21 MED ORDER — SODIUM CHLORIDE 0.9 % IV SOLN: INTRAVENOUS | Status: AC

## 2023-09-21 NOTE — Evaluation (Signed)
 Occupational Therapy Evaluation Patient Details Name: Allison Chavez MRN: [id number] DOB: Mar 10, 1941 Today's Date: 09/21/2023   History of Present Illness   83 y.o. female admitted 09/19/23 with altered mental status, dysuria and weakness. Dx with acute metabolic encephalopathy, likely secondary to UTI. PMH includes dementia, degenerative disc disease, insomnia, major depression, mild renal insufficiency, hyperlipidemia, essential hypertension, chronic kidney disease stage III.     Clinical Impressions Pt is from home where she has a live-in caregiver as well as additional assist from family and friends. She needs assist for most ADL. Today she is pleasant, happy to talk about her family and piano. She was mod to max for bed mobility and able to sit supervision level EOB and engaged in functional grooming tasks with cues for task initiation. OT also focused on attempting to open up L hand more. Family in room says that it has been contracted for approx 8 months. 3rd digit is the tightest followed by 4th. Thumb and pointer finger with good ROM and 5th digit able to come away from palm. OT was able to slide a wash cloth under the 3rd digit - but it has started to indent into the palm. Too contacted at this point for a palm guard. OT will continue to assess. Pt was able to stand with mod/max assist and with cues and assist with weight shift take steps up the bed. OT will continue to follow acutely and post-acute Pt will require skilled OT at rehab of <3 hours daily to maximize safety and independence in ADL and functional transfers.      If plan is discharge home, recommend the following:   Two people to help with walking and/or transfers;A lot of help with bathing/dressing/bathroom;Direct supervision/assist for medications management;Direct supervision/assist for financial management;Assist for transportation;Help with stairs or ramp for entrance;Supervision due to cognitive status     Functional  Status Assessment   Patient has had a recent decline in their functional status and/or demonstrates limited ability to make significant improvements in function in a reasonable and predictable amount of time     Equipment Recommendations   Other (comment) (defer to next venue of care)     Recommendations for Other Services   PT consult     Precautions/Restrictions   Precautions Precautions: Fall Recall of Precautions/Restrictions: Impaired Precaution/Restrictions Comments: oriented to self only Restrictions Weight Bearing Restrictions Per Provider Order: No     Mobility Bed Mobility Overal bed mobility: Needs Assistance Bed Mobility: Sit to Supine, Rolling, Sidelying to Sit Rolling: Mod assist Sidelying to sit: Mod assist   Sit to supine: Total assist   General bed mobility comments: vc for sequencing, log roll utilized, assist with trunk elevation. helicopter technique used to return supine    Transfers Overall transfer level: Needs assistance Equipment used: 1 person hand held assist Transfers: Sit to/from Stand Sit to Stand: Mod assist           General transfer comment: mod A to power up from bed, with assist with weight shift was able to take side steps up the bed      Balance Overall balance assessment: Needs assistance Sitting-balance support: Feet supported, Single extremity supported Sitting balance-Leahy Scale: Fair     Standing balance support: Single extremity supported, During functional activity, Reliant on assistive device for balance Standing balance-Leahy Scale: Poor  ADL either performed or assessed with clinical judgement   ADL Overall ADL's : Needs assistance/impaired     Grooming: Oral care;Minimal assistance;Sitting;Cueing for sequencing Grooming Details (indicate cue type and reason): EOB unsupported seated balance Upper Body Bathing: Moderate assistance Upper Body Bathing Details  (indicate cue type and reason): for back Lower Body Bathing: Maximal assistance   Upper Body Dressing : Maximal assistance   Lower Body Dressing: Maximal assistance   Toilet Transfer: Maximal assistance;Stand-pivot Toilet Transfer Details (indicate cue type and reason): posterior lean, pushed back of legs against the bed Toileting- Clothing Manipulation and Hygiene: Maximal assistance       Functional mobility during ADLs: Maximal assistance;Cueing for sequencing (side steps up the bed) General ADL Comments: need to work on L hand contracture     Vision Baseline Vision/History: 1 Wears glasses Patient Visual Report: No change from baseline       Perception         Praxis         Pertinent Vitals/Pain Pain Assessment Pain Assessment: Faces Faces Pain Scale: Hurts a little bit Pain Location: L middle finger with attempts at moving it Pain Descriptors / Indicators: Guarding, Grimacing, Contraction Pain Intervention(s): Monitored during session, Repositioned, Relaxation, Utilized relaxation techniques     Extremity/Trunk Assessment Upper Extremity Assessment Upper Extremity Assessment: LUE deficits/detail LUE Deficits / Details: noted 3rd and 4th fingers to be in flexion contracture, pt unable to open fingers LUE: Unable to fully assess due to immobilization (hand) LUE Sensation: WNL LUE Coordination: decreased fine motor   Lower Extremity Assessment Lower Extremity Assessment: Defer to PT evaluation   Cervical / Trunk Assessment Cervical / Trunk Assessment: Kyphotic   Communication Communication Communication: No apparent difficulties   Cognition Arousal: Alert Behavior During Therapy: WFL for tasks assessed/performed Cognition: History of cognitive impairments             OT - Cognition Comments: Pt was excellent piano player, and enjoyed [place] grammar                 Following commands: Impaired Following commands impaired: Follows one step  commands with increased time     Cueing  General Comments   Cueing Techniques: Verbal cues;Tactile cues      Exercises Exercises: Other exercises Other Exercises Other Exercises: focused on relaxing L digits as much as possible and opening hand as much as possible. Able to slide wash cloth in between 3rd digit and skin.   Shoulder Instructions      Home Living Family/patient expects to be discharged to:: Skilled nursing facility Living Arrangements: Spouse/significant other                               Additional Comments: they have a live-in caregiver "Almyra Free" and then additional help from a person who works at [employer] in [place]      Prior Functioning/Environment Prior Level of Function : Needs assist;Patient poor historian/Family not available  Cognitive Assist : ADLs (cognitive)   ADLs (Cognitive): Set up cues Physical Assist : Mobility (physical);ADLs (physical) Mobility (physical): Bed mobility;Transfers;Gait ADLs (physical): Grooming;Bathing;Dressing;Toileting;IADLs Mobility Comments: always has assist ADLs Comments: gets assist for all aspects, except self-feeding    OT Problem List: Decreased activity tolerance;Impaired balance (sitting and/or standing);Impaired UE functional use;Pain   OT Treatment/Interventions: Self-care/ADL training;Therapeutic exercise;DME and/or AE instruction;Manual therapy;Therapeutic activities;Patient/family education;Balance training      OT Goals(Current goals can be found in the  care plan section)   Acute Rehab OT Goals Patient Stated Goal: open up L hand more OT Goal Formulation: With family Time For Goal Achievement: 10/05/23 Potential to Achieve Goals: Fair   OT Frequency:  Min 1X/week    Co-evaluation              AM-PAC OT "6 Clicks" Daily Activity     Outcome Measure Help from another person eating meals?: A Little Help from another person taking care of personal grooming?: A Little Help from  another person toileting, which includes using toliet, bedpan, or urinal?: A Lot Help from another person bathing (including washing, rinsing, drying)?: A Lot Help from another person to put on and taking off regular upper body clothing?: A Lot Help from another person to put on and taking off regular lower body clothing?: A Lot 6 Click Score: 14   End of Session Equipment Utilized During Treatment: Gait belt Nurse Communication: Mobility status  Activity Tolerance: Patient tolerated treatment well Patient left: in bed;with call bell/phone within reach;with family/visitor present  OT Visit Diagnosis: Unsteadiness on feet (R26.81);Other abnormalities of gait and mobility (R26.89);Pain;Muscle weakness (generalized) (M62.81);Other symptoms and signs involving cognitive function Pain - Right/Left: Right Pain - part of body: Hip (Ribs)                Time: 5638-7564 OT Time Calculation (min): 44 min Charges:  OT General Charges $OT Visit: 1 Visit OT Evaluation $OT Eval Moderate Complexity: 1 Mod OT Treatments $Self Care/Home Management : 23-37 mins  Nyoka Cowden OTR/L Acute Rehabilitation Services Office: [phone]  Evern Bio [facility] 09/21/2023, 6:30 PM

## 2023-09-21 NOTE — TOC Initial Note (Signed)
 Transition of Care [facility]) - Initial/Assessment Note   Patient Details  Name: Allison Chavez MRN: [id number] Date of Birth: 10/31/1940  Transition of Care [facility]) CM/SW Contact:    Ewing Schlein, LCSW Phone Number: 09/21/2023, 1:45 PM  Clinical Narrative: PT evaluation recommended SNF. CSW spoke with spouse, Carlyle Achenbach, to discuss recommendations as patient is currently oriented to self only. Spouse agreeable to rehab. FL2 done; PASRR received. Initial referral faxed out. TOC awaiting bed offers.            Expected Discharge Plan: Skilled Nursing Facility Barriers to Discharge: Continued Medical Work up  Patient Goals and CMS Choice Patient states their goals for this hospitalization and ongoing recovery are:: Go to rehab CMS Medicare.gov Compare Post Acute Care list provided to:: Patient Represenative (must comment) Ronalee Belts (husband)) Choice offered to / list presented to : Spouse  Expected Discharge Plan and Services In-house Referral: Clinical Social Work Post Acute Care Choice: Skilled Nursing Facility Living arrangements for the past 2 months: Single Family Home            DME Arranged: N/A DME Agency: NA  Prior Living Arrangements/Services Living arrangements for the past 2 months: Single Family Home Lives with:: Spouse Patient language and need for interpreter reviewed:: Yes Do you feel safe going back to the place where you live?: Yes      Need for Family Participation in Patient Care: Yes (Comment) (Patient is currently oriented to self only.) Care giver support system in place?: Yes (comment) Criminal Activity/Legal Involvement Pertinent to Current Situation/Hospitalization: No - Comment as needed  Activities of Daily Living ADL Screening (condition at time of admission) Independently performs ADLs?: No Does the patient have a NEW difficulty with bathing/dressing/toileting/self-feeding that is expected to last >3 days?: Yes (Initiates electronic notice to provider for  possible OT consult) Does the patient have a NEW difficulty with getting in/out of bed, walking, or climbing stairs that is expected to last >3 days?: Yes (Initiates electronic notice to provider for possible PT consult) Does the patient have a NEW difficulty with communication that is expected to last >3 days?: No Is the patient deaf or have difficulty hearing?: No Does the patient have difficulty seeing, even when wearing glasses/contacts?: No Does the patient have difficulty concentrating, remembering, or making decisions?: No  Permission Sought/Granted Permission sought to share information with : Facility Industrial/product designer granted to share information with : Yes, Verbal Permission Granted Permission granted to share info w AGENCY: SNFs  Emotional Assessment Orientation: : Oriented to Self Alcohol / Substance Use: Not Applicable Psych Involvement: No (comment)  Admission diagnosis:  Urinary tract infection without hematuria, site unspecified [N39.0] Altered mental status, unspecified altered mental status type [R41.82] Acute metabolic encephalopathy [G93.41] Patient Active Problem List   Diagnosis Date Noted   FTT (failure to thrive) in adult 09/19/2023   Anemia in chronic kidney disease (CKD) 09/19/2023   Acute metabolic encephalopathy 09/19/2023   Generalized anxiety disorder 09/18/2022   Major depressive disorder 09/18/2022   DJD (degenerative joint disease) of cervical spine 09/18/2022   Dementia due to Alzheimer's disease 12/19/2021   Senile entropion of right lower eyelid 06/07/2020   Intermediate stage nonexudative age-related macular degeneration of both eyes 12/08/2019   Vitreous prolapse of left eye 12/08/2019   Degenerative retinal drusen of left eye 12/08/2019   Degenerative retinal drusen of right eye 12/08/2019   Pseudophakia of both eyes 12/08/2019   CKD (chronic kidney disease), stage III 12/25/2017  Hyponatremia 12/25/2017   Hypokalemia  12/25/2017   Alcohol abuse 12/25/2017   Sigmoid diverticulosis 01/15/2017   Osteopenia 01/11/2015   Hyperlipidemia 01/11/2015   Insomnia 10/08/2013   HTN (hypertension) 03/07/2012   PCP:  Shade Flood, MD Pharmacy:   [facility] [place], [place] - [address] [place] [address] Phone: [phone] Fax: [phone]  Social Drivers of Health (SDOH) Social History: SDOH Screenings   Food Insecurity: No Food Insecurity (09/20/2023)  Housing: Low Risk  (09/20/2023)  Transportation Needs: No Transportation Needs (09/20/2023)  Utilities: Not At Risk (09/20/2023)  Alcohol Screen: Low Risk  (12/13/2022)  Depression (PHQ2-9): Low Risk  (06/26/2023)  Financial Resource Strain: Low Risk  (12/13/2022)  Physical Activity: Insufficiently Active (12/13/2022)  Social Connections: Socially Integrated (09/20/2023)  Stress: No Stress Concern Present (12/13/2022)  Tobacco Use: Medium Risk (09/19/2023)   SDOH Interventions:    Readmission Risk Interventions     No data to display

## 2023-09-21 NOTE — Plan of Care (Signed)
  Problem: Education: Goal: Knowledge of General Education information will improve Description: Including pain rating scale, medication(s)/side effects and non-pharmacologic comfort measures Outcome: Progressing   Problem: Clinical Measurements: Goal: Ability to maintain clinical measurements within normal limits will improve Outcome: Progressing   Problem: Clinical Measurements: Goal: Respiratory complications will improve Outcome: Progressing   Problem: Activity: Goal: Risk for activity intolerance will decrease Outcome: Progressing   

## 2023-09-21 NOTE — Progress Notes (Addendum)
 PROGRESS NOTE    Allison Chavez  NGE:[id number] DOB: 1940/10/26 DOA: 09/19/2023 PCP: Shade Flood, MD  Outpatient Specialists:     Brief Narrative:  Patient is an 83 year old female, with past medical history significant for dementia, depression, chronic kidney disease stage III, hypertension, and hyperlipidemia.  Patient was admitted with acute metabolic encephalopathy, likely secondary to UTI.  09/20/2023: Patient seen.  No significant history from patient. 09/21/2023: Urine culture is growing E. coli and Enterococcus.  Continue antibiotics.  Follow final cultures.   Assessment & Plan:   Principal Problem:   Acute metabolic encephalopathy Active Problems:   HTN (hypertension)   Hyperlipidemia   CKD (chronic kidney disease), stage III   Dementia due to Alzheimer's disease   Major depressive disorder   FTT (failure to thrive) in adult   Anemia in chronic kidney disease (CKD)   Acute metabolic encephalopathy:  -Likely secondary to UTI.  See above documentation. -Patient may have been volume depleted as well. -Follow urine cultures -Continue IV fluids -Continue to monitor renal function and electrolytes.  UTI:  -UA revealed many bacteria and leukocytes. -Urine culture is growing and E. coli and Enterococcus -Patient is on IV Rocephin. -Follow final urine cultures.    Dementia:  -No behavioral problems.     Essential hypertension:  -Continue to monitor and manage expectantly.    Hyperlipidemia: -Continue home statin   Chronic kidney disease stage IIIb:  -Stable. -Continue to monitor renal function and electrolytes.     Anemia: -Stable. -Likely anemia of chronic inflammation. -Patient has chronic kidney disease.   Major depressive disorder:    Adult failure to thrive: Patient unable to take care of herself at home.  Plan is to send patient to skilled facility.  Case management already working on it.   DVT prophylaxis: Subcutaneous Lovenox. Code Status:   Family Communication: I called Abra Lingenfelter to update him, by the call went to voicemail. Disposition Plan: Patient remains inpatient.   Consultants:  None.  Procedures:  None.  Antimicrobials:  IV Rocephin.   Subjective: No significant history from patient.  Objective: Vitals:   09/20/23 1954 09/21/23 0012 09/21/23 0440 09/21/23 1241  BP: 139/70 (!) 125/53 (!) 148/66 (!) 159/69  Pulse: 87 77 69 69  Resp: 20 (!) 24 16 16   Temp: 97.9 F (36.6 C) 97.9 F (36.6 C) 97.9 F (36.6 C) (!) 97.4 F (36.3 C)  TempSrc: Oral Oral Oral   SpO2: 97% 97% 97% 92%  Weight:      Height:        Intake/Output Summary (Last 24 hours) at 09/21/2023 1317 Last data filed at 09/21/2023 1100 Gross per 24 hour  Intake 843.59 ml  Output --  Net 843.59 ml   Filed Weights   09/19/23 1349  Weight: 59 kg    Examination:  General exam: Appears calm and comfortable  Respiratory system: Clear to auscultation. . Cardiovascular system: S1 & S2 Gastrointestinal system: Abdomen is soft and nontender. Central nervous system: Awake and alert.  Patient is demented.   Extremities: No leg edema.  Data Reviewed: I have personally reviewed following labs and imaging studies  CBC: Recent Labs  Lab 09/19/23 1501 09/20/23 0812  WBC 10.9* 8.9  NEUTROABS 5.0  --   HGB 9.6* 10.4*  HCT 32.2* 32.1*  MCV 100.0 95.8  PLT 248 270   Basic Metabolic Panel: Recent Labs  Lab 09/19/23 1501 09/20/23 0812  NA 139 137  K 4.2 3.9  CL 107 102  CO2 21* 22  GLUCOSE 86 89  BUN 34* 28*  CREATININE 1.52* 1.35*  CALCIUM 9.2 9.3   GFR: Estimated Creatinine Clearance: 27.4 mL/min (A) (by C-G formula based on SCr of 1.35 mg/dL (H)). Liver Function Tests: Recent Labs  Lab 09/19/23 1501 09/20/23 0812  AST 15 12*  ALT 9 11  ALKPHOS 77 86  BILITOT 0.6 0.9  PROT 7.2 7.6  ALBUMIN 3.5 3.9   No results for input(s): "LIPASE", "AMYLASE" in the last 168 hours. No results for input(s): "AMMONIA" in the last  168 hours. Coagulation Profile: No results for input(s): "INR", "PROTIME" in the last 168 hours. Cardiac Enzymes: No results for input(s): "CKTOTAL", "CKMB", "CKMBINDEX", "TROPONINI" in the last 168 hours. BNP (last 3 results) No results for input(s): "PROBNP" in the last 8760 hours. HbA1C: No results for input(s): "HGBA1C" in the last 72 hours. CBG: Recent Labs  Lab 09/19/23 1358  GLUCAP 99   Lipid Profile: No results for input(s): "CHOL", "HDL", "LDLCALC", "TRIG", "CHOLHDL", "LDLDIRECT" in the last 72 hours. Thyroid Function Tests: No results for input(s): "TSH", "T4TOTAL", "FREET4", "T3FREE", "THYROIDAB" in the last 72 hours. Anemia Panel: No results for input(s): "VITAMINB12", "FOLATE", "FERRITIN", "TIBC", "IRON", "RETICCTPCT" in the last 72 hours. Urine analysis:    Component Value Date/Time   COLORURINE YELLOW 09/19/2023 1414   APPEARANCEUR HAZY (A) 09/19/2023 1414   APPEARANCEUR Clear 09/26/2017 1341   LABSPEC 1.017 09/19/2023 1414   PHURINE 7.0 09/19/2023 1414   GLUCOSEU NEGATIVE 09/19/2023 1414   HGBUR NEGATIVE 09/19/2023 1414   BILIRUBINUR NEGATIVE 09/19/2023 1414   BILIRUBINUR Negative 09/26/2017 1341   KETONESUR NEGATIVE 09/19/2023 1414   PROTEINUR NEGATIVE 09/19/2023 1414   UROBILINOGEN 0.2 01/22/2017 1544   UROBILINOGEN 0.2 07/28/2016 1913   NITRITE NEGATIVE 09/19/2023 1414   LEUKOCYTESUR MODERATE (A) 09/19/2023 1414   Sepsis Labs: @LABRCNTIP (procalcitonin:4,lacticidven:4)  ) Recent Results (from the past 240 hours)  Urine Culture     Status: Abnormal (Preliminary result)   Collection Time: 09/19/23  2:14 PM   Specimen: Urine, Random  Result Value Ref Range Status   Specimen Description   Final    URINE, RANDOM Performed at [facility], [address], [place], [place] [address]    Special Requests   Final    NONE Reflexed from [id number] Performed at [facility], [address] [place], [place] [address]     Culture (A)  Final    >=100,000 COLONIES/mL ESCHERICHIA COLI >=100,000 COLONIES/mL ENTEROCOCCUS FAECALIS SUSCEPTIBILITIES TO FOLLOW Performed at [facility], [address] [place], [place] [address]    Report Status PENDING  Incomplete         Radiology Studies: DG Chest Port 1 View Result Date: 09/19/2023 CLINICAL DATA:  Altered mental status. EXAM: PORTABLE CHEST 1 VIEW COMPARISON:  June 26, 2023. FINDINGS: Stable cardiomegaly. Both lungs are clear. Old left rib fractures are noted. IMPRESSION: No active disease. Electronically Signed   By: Lupita Raider M.D.   On: 09/19/2023 14:58   CT Head Wo Contrast Result Date: 09/19/2023 CLINICAL DATA:  Mental status change, unknown cause EXAM: CT HEAD WITHOUT CONTRAST TECHNIQUE: Contiguous axial images were obtained from the base of the skull through the vertex without intravenous contrast. RADIATION DOSE REDUCTION: This exam was performed according to the departmental dose-optimization program which includes automated exposure control, adjustment of the mA and/or kV according to patient size and/or use of iterative reconstruction technique. COMPARISON:  CT head 07/03/2023. FINDINGS: Brain:  No evidence of acute infarction, hemorrhage, hydrocephalus, extra-axial collection or mass lesion/mass effect. Similar cerebral atrophy with ex vacuo ventricular dilation. Similar patchy white matter hypodensities, compatible with chronic microvascular ischemic disease. Vascular: No hyperdense vessel identified. Skull: No acute fracture. Sinuses/Orbits: Clear sinuses.  No acute findings. Other: No mastoid effusions. IMPRESSION: 1. Stable head CT.  No evidence of acute intracranial abnormality. 2.  Cerebral Atrophy (ICD10-G31.9). Electronically Signed   By: Feliberto Harts M.D.   On: 09/19/2023 14:42        Scheduled Meds:  enoxaparin (LOVENOX) injection  30 mg Subcutaneous Q24H   Continuous Infusions:  sodium chloride 75 mL/hr at 09/21/23  1217   cefTRIAXone (ROCEPHIN)  IV 1 g (09/20/23 2004)     LOS: 2 days    Time spent: 35 minutes.    Berton Mount, MD  [facility] Pager #: [phone] 7PM-7AM contact night coverage as above

## 2023-09-21 NOTE — NC FL2 (Signed)
 [place] MEDICAID [facility] LEVEL OF CARE FORM     IDENTIFICATION  Patient Name: Allison Chavez Birthdate: 1940-10-28 Sex: female Admission Date (Current Location): 09/19/2023  [facility] and [place] Number:  Producer, television/film/video and Address:  [facility],  [address] [place], [place] [address]      Provider Number: [id number]  Attending Physician Name and Address:  Barnetta Chapel, MD  Relative Name and Phone Number:  Kiyara Bouffard (spouse) Ph: [phone]    Current Level of Care: Hospital Recommended Level of Care: Skilled Nursing Facility Prior Approval Number:    Date Approved/Denied:   PASRR Number: [id number]  Discharge Plan: SNF    Current Diagnoses: Patient Active Problem List   Diagnosis Date Noted   FTT (failure to thrive) in adult 09/19/2023   Anemia in chronic kidney disease (CKD) 09/19/2023   Acute metabolic encephalopathy 09/19/2023   Generalized anxiety disorder 09/18/2022   Major depressive disorder 09/18/2022   DJD (degenerative joint disease) of cervical spine 09/18/2022   Dementia due to Alzheimer's disease 12/19/2021   Senile entropion of right lower eyelid 06/07/2020   Intermediate stage nonexudative age-related macular degeneration of both eyes 12/08/2019   Vitreous prolapse of left eye 12/08/2019   Degenerative retinal drusen of left eye 12/08/2019   Degenerative retinal drusen of right eye 12/08/2019   Pseudophakia of both eyes 12/08/2019   CKD (chronic kidney disease), stage III 12/25/2017   Hyponatremia 12/25/2017   Hypokalemia 12/25/2017   Alcohol abuse 12/25/2017   Sigmoid diverticulosis 01/15/2017   Osteopenia 01/11/2015   Hyperlipidemia 01/11/2015   Insomnia 10/08/2013   HTN (hypertension) 03/07/2012    Orientation RESPIRATION BLADDER Height & Weight     Self  Normal Incontinent Weight: 130 lb 1.1 oz (59 kg) Height:  5\' 1"  (154.9 cm)  BEHAVIORAL SYMPTOMS/MOOD NEUROLOGICAL BOWEL NUTRITION STATUS      Continent Diet (Heart  healthy diet)  AMBULATORY STATUS COMMUNICATION OF NEEDS Skin   Limited Assist Verbally Other (Comment) (Ecchymosis: arms)                       Personal Care Assistance Level of Assistance  Bathing, Feeding, Dressing Bathing Assistance: Limited assistance Feeding assistance: Independent Dressing Assistance: Limited assistance     Functional Limitations Info  Sight, Hearing, Speech Sight Info: Impaired Hearing Info: Adequate Speech Info: Adequate    SPECIAL CARE FACTORS FREQUENCY  PT (By licensed PT), OT (By licensed OT)     PT Frequency: 5x's/week OT Frequency: 5x's/week            Contractures Contractures Info: Present    Additional Factors Info  Code Status, Allergies Code Status Info: Full Allergies Info: Ace Inhibitors, Sulfa Antibiotics, Wellbutrin (Bupropion)           Current Medications (09/21/2023):  This is the current hospital active medication list Current Facility-Administered Medications  Medication Dose Route Frequency Provider Last Rate Last Admin   0.9 %  sodium chloride infusion   Intravenous Continuous Berton Mount I, MD 75 mL/hr at 09/21/23 1217 New Bag at 09/21/23 1217   acetaminophen (TYLENOL) tablet 650 mg  650 mg Oral Q6H PRN Rometta Emery, MD   650 mg at 09/21/23 1223   Or   acetaminophen (TYLENOL) suppository 650 mg  650 mg Rectal Q6H PRN Rometta Emery, MD       cefTRIAXone (ROCEPHIN) 1 g in sodium chloride 0.9 % 100 mL IVPB  1 g Intravenous Q24H Berton Mount  I, MD 200 mL/hr at 09/20/23 2004 1 g at 09/20/23 2004   enoxaparin (LOVENOX) injection 30 mg  30 mg Subcutaneous Q24H Earlie Lou L, MD   30 mg at 09/21/23 1217   ondansetron (ZOFRAN) tablet 4 mg  4 mg Oral Q6H PRN Rometta Emery, MD       Or   ondansetron (ZOFRAN) injection 4 mg  4 mg Intravenous Q6H PRN Rometta Emery, MD         Discharge Medications: Please see discharge summary for a list of discharge medications.  Relevant Imaging  Results:  Relevant Lab Results:   Additional Information SSN: [id number]  Ewing Schlein, LCSW

## 2023-09-21 NOTE — Plan of Care (Signed)

## 2023-09-22 DIAGNOSIS — G9341 Metabolic encephalopathy: Secondary | ICD-10-CM | POA: Diagnosis not present

## 2023-09-22 LAB — RENAL FUNCTION PANEL
Albumin: 3.1 g/dL — ABNORMAL LOW (ref 3.5–5.0)
Anion gap: 7 (ref 5–15)
BUN: 21 mg/dL (ref 8–23)
CO2: 24 mmol/L (ref 22–32)
Calcium: 8.8 mg/dL — ABNORMAL LOW (ref 8.9–10.3)
Chloride: 106 mmol/L (ref 98–111)
Creatinine, Ser: 1.16 mg/dL — ABNORMAL HIGH (ref 0.44–1.00)
GFR, Estimated: 48 mL/min — ABNORMAL LOW (ref >60)
Glucose, Bld: 93 mg/dL (ref 70–99)
Phosphorus: 3.4 mg/dL (ref 2.5–4.6)
Potassium: 3.9 mmol/L (ref 3.5–5.1)
Sodium: 137 mmol/L (ref 135–145)

## 2023-09-22 LAB — URINE CULTURE: Culture: >=100000 — AB

## 2023-09-22 MED ORDER — SODIUM CHLORIDE 0.9 % IV SOLN
2.0000 g | Freq: Three times a day (TID) | INTRAVENOUS | Status: DC
  Administered 2023-09-22 – 2023-09-23 (×4): 2 g via INTRAVENOUS
  Filled 2023-09-22 (×4): qty 2000

## 2023-09-22 MED ORDER — ENOXAPARIN SODIUM 40 MG/0.4ML IJ SOSY
40.0000 mg | PREFILLED_SYRINGE | INTRAMUSCULAR | Status: DC
  Administered 2023-09-23: 40 mg via SUBCUTANEOUS
  Filled 2023-09-22: qty 0.4

## 2023-09-22 NOTE — Progress Notes (Signed)
 PROGRESS NOTE    Allison Chavez  ZOX:[id number] DOB: [birth year] DOA: 09/19/2023 PCP: Shade Flood, MD  Outpatient Specialists:     Brief Narrative:  Patient is an 83 year old female, with past medical history significant for dementia, depression, chronic kidney disease stage III, hypertension, and hyperlipidemia.  Patient was admitted with acute metabolic encephalopathy, likely secondary to UTI.  09/20/2023: Patient seen.  No significant history from patient. 09/21/2023: Urine culture is growing E. coli and Enterococcus.  Continue antibiotics.  Follow final cultures. 09/22/2023: E. coli and Enterococcus faecalis sensitive to ampicillin.  Will change antibiotics to IV ampicillin.  Complete course of antibiotics.    Assessment & Plan:   Principal Problem:   Acute metabolic encephalopathy Active Problems:   HTN (hypertension)   Hyperlipidemia   CKD (chronic kidney disease), stage III   Dementia due to Alzheimer's disease   Major depressive disorder   FTT (failure to thrive) in adult   Anemia in chronic kidney disease (CKD)   Acute metabolic encephalopathy:  -Likely secondary to UTI.  See above documentation. -Urine culture grew E. coli and Enterococcus faecalis. -Complete course of antibiotics. -Patient may have been volume depleted as well. -Continue IV fluids -Continue to monitor renal function and electrolytes.  UTI:  -UA revealed many bacteria and leukocytes. -Urine culture has grown E. coli and Enterococcus sensitive to ampicillin. -Change IV Rocephin to IV ampicillin. -Complete course of antibiotics.    Dementia:  -No behavioral problems.     Essential hypertension:  -Continue to monitor and manage expectantly.    Hyperlipidemia: -Continue home statin   Chronic kidney disease stage IIIb:  -Stable. -Continue to monitor renal function and electrolytes.     Anemia: -Stable. -Likely anemia of chronic inflammation. -Patient has chronic kidney disease.    Major depressive disorder:    Adult failure to thrive: Patient unable to take care of herself at home.  Plan is to send patient to skilled facility.  Case management already working on it.   DVT prophylaxis: Subcutaneous Lovenox. Code Status:  Family Communication: I called Ireta Pullman to update him, by the call went to voicemail. Disposition Plan: Patient remains inpatient.   Consultants:  None.  Procedures:  None.  Antimicrobials:  IV Rocephin.   Subjective: No significant history from patient.  Objective: Vitals:   09/21/23 1241 09/21/23 2123 09/22/23 0547 09/22/23 1250  BP: (!) 159/69 137/62 (!) 160/61 133/64  Pulse: 69 71 69 84  Resp: 16 16 16 16   Temp: (!) 97.4 F (36.3 C) (!) 97.5 F (36.4 C) 98 F (36.7 C) 98.8 F (37.1 C)  TempSrc:    Oral  SpO2: 92% 98% (!) 84% 98%  Weight:      Height:        Intake/Output Summary (Last 24 hours) at 09/22/2023 1850 Last data filed at 09/22/2023 1802 Gross per 24 hour  Intake 200 ml  Output --  Net 200 ml   Filed Weights   09/19/23 1349  Weight: 59 kg    Examination:  General exam: Appears calm and comfortable  Respiratory system: Clear to auscultation. . Cardiovascular system: S1 & S2 Gastrointestinal system: Abdomen is soft and nontender. Central nervous system: Awake and alert.  Patient is demented.   Extremities: No leg edema.  Data Reviewed: I have personally reviewed following labs and imaging studies  CBC: Recent Labs  Lab 09/19/23 1501 09/20/23 0812  WBC 10.9* 8.9  NEUTROABS 5.0  --   HGB 9.6* 10.4*  HCT 32.2* 32.1*  MCV 100.0 95.8  PLT 248 270   Basic Metabolic Panel: Recent Labs  Lab 09/19/23 1501 09/20/23 0812 09/22/23 0549  NA 139 137 137  K 4.2 3.9 3.9  CL 107 102 106  CO2 21* 22 24  GLUCOSE 86 89 93  BUN 34* 28* 21  CREATININE 1.52* 1.35* 1.16*  CALCIUM 9.2 9.3 8.8*  PHOS  --   --  3.4   GFR: Estimated Creatinine Clearance: 31.9 mL/min (A) (by C-G formula based on SCr  of 1.16 mg/dL (H)). Liver Function Tests: Recent Labs  Lab 09/19/23 1501 09/20/23 0812 09/22/23 0549  AST 15 12*  --   ALT 9 11  --   ALKPHOS 77 86  --   BILITOT 0.6 0.9  --   PROT 7.2 7.6  --   ALBUMIN 3.5 3.9 3.1*   No results for input(s): "LIPASE", "AMYLASE" in the last 168 hours. No results for input(s): "AMMONIA" in the last 168 hours. Coagulation Profile: No results for input(s): "INR", "PROTIME" in the last 168 hours. Cardiac Enzymes: No results for input(s): "CKTOTAL", "CKMB", "CKMBINDEX", "TROPONINI" in the last 168 hours. BNP (last 3 results) No results for input(s): "PROBNP" in the last 8760 hours. HbA1C: No results for input(s): "HGBA1C" in the last 72 hours. CBG: Recent Labs  Lab 09/19/23 1358  GLUCAP 99   Lipid Profile: No results for input(s): "CHOL", "HDL", "LDLCALC", "TRIG", "CHOLHDL", "LDLDIRECT" in the last 72 hours. Thyroid Function Tests: No results for input(s): "TSH", "T4TOTAL", "FREET4", "T3FREE", "THYROIDAB" in the last 72 hours. Anemia Panel: No results for input(s): "VITAMINB12", "FOLATE", "FERRITIN", "TIBC", "IRON", "RETICCTPCT" in the last 72 hours. Urine analysis:    Component Value Date/Time   COLORURINE YELLOW 09/19/2023 1414   APPEARANCEUR HAZY (A) 09/19/2023 1414   APPEARANCEUR Clear 09/26/2017 1341   LABSPEC 1.017 09/19/2023 1414   PHURINE 7.0 09/19/2023 1414   GLUCOSEU NEGATIVE 09/19/2023 1414   HGBUR NEGATIVE 09/19/2023 1414   BILIRUBINUR NEGATIVE 09/19/2023 1414   BILIRUBINUR Negative 09/26/2017 1341   KETONESUR NEGATIVE 09/19/2023 1414   PROTEINUR NEGATIVE 09/19/2023 1414   UROBILINOGEN 0.2 01/22/2017 1544   UROBILINOGEN 0.2 07/28/2016 1913   NITRITE NEGATIVE 09/19/2023 1414   LEUKOCYTESUR MODERATE (A) 09/19/2023 1414   Sepsis Labs: @LABRCNTIP (procalcitonin:4,lacticidven:4)  ) Recent Results (from the past 240 hours)  Urine Culture     Status: Abnormal   Collection Time: 09/19/23  2:14 PM   Specimen: Urine, Random   Result Value Ref Range Status   Specimen Description   Final    URINE, RANDOM Performed at [facility], [address], [place], [place] [address]    Special Requests   Final    NONE Reflexed from [id number] Performed at [facility], [address], [place], [place] [address]    Culture (A)  Final    >=100,000 COLONIES/mL ESCHERICHIA COLI >=100,000 COLONIES/mL ENTEROCOCCUS FAECALIS    Report Status 09/22/2023 FINAL  Final   Organism ID, Bacteria ESCHERICHIA COLI (A)  Final   Organism ID, Bacteria ENTEROCOCCUS FAECALIS (A)  Final      Susceptibility   Escherichia coli - MIC*    AMPICILLIN <=2 SENSITIVE Sensitive     CEFAZOLIN <=4 SENSITIVE Sensitive     CEFEPIME <=0.12 SENSITIVE Sensitive     CEFTRIAXONE <=0.25 SENSITIVE Sensitive     CIPROFLOXACIN <=0.25 SENSITIVE Sensitive     GENTAMICIN <=1 SENSITIVE Sensitive     IMIPENEM 1 SENSITIVE Sensitive     NITROFURANTOIN 64 INTERMEDIATE  Intermediate     TRIMETH/SULFA <=20 SENSITIVE Sensitive     AMPICILLIN/SULBACTAM <=2 SENSITIVE Sensitive     PIP/TAZO <=4 SENSITIVE Sensitive ug/mL    * >=100,000 COLONIES/mL ESCHERICHIA COLI   Enterococcus faecalis - MIC*    AMPICILLIN 8 SENSITIVE Sensitive     NITROFURANTOIN <=16 SENSITIVE Sensitive     VANCOMYCIN 16 INTERMEDIATE Intermediate     * >=100,000 COLONIES/mL ENTEROCOCCUS FAECALIS         Radiology Studies: No results found.       Scheduled Meds:  [START ON 09/23/2023] enoxaparin (LOVENOX) injection  40 mg Subcutaneous Q24H   Continuous Infusions:  ampicillin (OMNIPEN) IV Stopped (09/22/23 1732)     LOS: 3 days    Time spent: 35 minutes.    Berton Mount, MD  [facility] Pager #: [phone] 7PM-7AM contact night coverage as above

## 2023-09-22 NOTE — Plan of Care (Signed)
  Problem: Education: Goal: Knowledge of General Education information will improve Description: Including pain rating scale, medication(s)/side effects and non-pharmacologic comfort measures Outcome: Progressing   Problem: Health Behavior/Discharge Planning: Goal: Ability to manage health-related needs will improve Outcome: Progressing   Problem: Clinical Measurements: Goal: Ability to maintain clinical measurements within normal limits will improve Outcome: Progressing   Problem: Clinical Measurements: Goal: Will remain free from infection Outcome: Progressing   Problem: Activity: Goal: Risk for activity intolerance will decrease Outcome: Progressing   Problem: Nutrition: Goal: Adequate nutrition will be maintained Outcome: Progressing   Problem: Elimination: Goal: Will not experience complications related to bowel motility Outcome: Progressing   Problem: Elimination: Goal: Will not experience complications related to urinary retention Outcome: Progressing   Problem: Pain Managment: Goal: General experience of comfort will improve and/or be controlled Outcome: Progressing

## 2023-09-22 NOTE — Plan of Care (Signed)

## 2023-09-23 DIAGNOSIS — G9341 Metabolic encephalopathy: Secondary | ICD-10-CM | POA: Diagnosis not present

## 2023-09-23 LAB — CBC
HCT: 29.2 % — ABNORMAL LOW (ref 36.0–46.0)
Hemoglobin: 9.5 g/dL — ABNORMAL LOW (ref 12.0–15.0)
MCH: 30.1 pg (ref 26.0–34.0)
MCHC: 32.5 g/dL (ref 30.0–36.0)
MCV: 92.4 fL (ref 80.0–100.0)
Platelets: 215 10*3/uL (ref 150–400)
RBC: 3.16 MIL/uL — ABNORMAL LOW (ref 3.87–5.11)
RDW: 18.5 % — ABNORMAL HIGH (ref 11.5–15.5)
WBC: 6.4 10*3/uL (ref 4.0–10.5)
nRBC: 0 % (ref 0.0–0.2)

## 2023-09-23 MED ORDER — GUAIFENESIN 100 MG/5ML PO LIQD: 5.0000 mL | ORAL | Status: DC | PRN

## 2023-09-23 MED ORDER — HYDRALAZINE HCL 20 MG/ML IJ SOLN: 10.0000 mg | INTRAMUSCULAR | Status: DC | PRN

## 2023-09-23 MED ORDER — TRAZODONE HCL 50 MG PO TABS
50.0000 mg | ORAL_TABLET | Freq: Every evening | ORAL | Status: DC | PRN
  Administered 2023-09-23: 50 mg via ORAL
  Filled 2023-09-23: qty 1

## 2023-09-23 MED ORDER — FOSFOMYCIN TROMETHAMINE 3 G PO PACK
3.0000 g | PACK | Freq: Once | ORAL | Status: AC
  Administered 2023-09-23: 3 g via ORAL
  Filled 2023-09-23: qty 3

## 2023-09-23 MED ORDER — SENNOSIDES-DOCUSATE SODIUM 8.6-50 MG PO TABS: 1.0000 | ORAL_TABLET | Freq: Every evening | ORAL | Status: DC | PRN

## 2023-09-23 MED ORDER — IPRATROPIUM-ALBUTEROL 0.5-2.5 (3) MG/3ML IN SOLN: 3.0000 mL | RESPIRATORY_TRACT | Status: DC | PRN

## 2023-09-23 MED ORDER — METOPROLOL TARTRATE 5 MG/5ML IV SOLN: 5.0000 mg | INTRAVENOUS | Status: DC | PRN

## 2023-09-23 NOTE — Progress Notes (Signed)
 PROGRESS NOTE    Allison Chavez  ZOX:[id number] DOB: [birth year] DOA: 09/19/2023 PCP: Shade Flood, MD    Brief Narrative:   83 year old female, with past medical history significant for dementia, depression, chronic kidney disease stage III, hypertension, and hyperlipidemia. Patient was admitted with acute metabolic encephalopathy, likely secondary to UTI.   Assessment & Plan:  Principal Problem:   Acute metabolic encephalopathy Active Problems:   HTN (hypertension)   Hyperlipidemia   CKD (chronic kidney disease), stage III   Dementia due to Alzheimer's disease   Major depressive disorder   FTT (failure to thrive) in adult   Anemia in chronic kidney disease (CKD)    Acute metabolic encephalopathy likely secondary to urinary tract infection Urine cultures are growing E. coli and Enterococcus - Will discuss giving 1 dose of fosfomycin.  Completed 3 days of Rocephin    Dementia:  -No behavioral problems.     Essential hypertension:  -Continue to monitor and manage expectantly.     Hyperlipidemia: -Continue home statin   Chronic kidney disease stage IIIb:  -Stable. -Continue to monitor renal function and electrolytes.     Anemia: -Stable. -Likely anemia of chronic inflammation. -Patient has chronic kidney disease.   Major depressive disorder:    Adult failure to thrive: Plans for SNF   DVT prophylaxis: Subcutaneous Lovenox. Code Status:  Family Communication: I called Tempestt Silba to update him, by the call went to voicemail. Disposition Plan: SNF placement.    Subjective:  Doing ok no complaints.   Examination:  General exam: Appears calm and comfortable  Respiratory system: Clear to auscultation. Respiratory effort normal. Cardiovascular system: S1 & S2 heard, RRR. No JVD, murmurs, rubs, gallops or clicks. No pedal edema. Gastrointestinal system: Abdomen is nondistended, soft and nontender. No organomegaly or masses felt. Normal bowel sounds  heard. Central nervous system: Alert and oriented. No focal neurological deficits. Extremities: Symmetric 5 x 5 power. Skin: No rashes, lesions or ulcers Psychiatry: Judgement and insight appear normal. Mood & affect appropriate.                Diet Orders (From admission, onward)     Start     Ordered   09/20/23 0812  Diet Heart Room service appropriate? Yes; Fluid consistency: Thin  Diet effective now       Question Answer Comment  Room service appropriate? Yes   Fluid consistency: Thin      09/20/23 0811            Objective: Vitals:   09/22/23 1250 09/22/23 1937 09/23/23 0403 09/23/23 1216  BP: 133/64 (!) 140/65 (!) 155/55 (!) 155/52  Pulse: 84 80 76 71  Resp: 16 18 18    Temp: 98.8 F (37.1 C) 98.4 F (36.9 C) 98.2 F (36.8 C) 98.4 F (36.9 C)  TempSrc: Oral Oral Oral Oral  SpO2: 98% 95% 97% 95%  Weight:      Height:        Intake/Output Summary (Last 24 hours) at 09/23/2023 1402 Last data filed at 09/23/2023 0700 Gross per 24 hour  Intake 300 ml  Output 850 ml  Net -550 ml   Filed Weights   09/19/23 1349  Weight: 59 kg    Scheduled Meds:  enoxaparin (LOVENOX) injection  40 mg Subcutaneous Q24H   Continuous Infusions:  Nutritional status     Body mass index is 24.58 kg/m.  Data Reviewed:   CBC: Recent Labs  Lab 09/19/23 1501 09/20/23 0812 09/23/23 1121  WBC  10.9* 8.9 6.4  NEUTROABS 5.0  --   --   HGB 9.6* 10.4* 9.5*  HCT 32.2* 32.1* 29.2*  MCV 100.0 95.8 92.4  PLT 248 270 215   Basic Metabolic Panel: Recent Labs  Lab 09/19/23 1501 09/20/23 0812 09/22/23 0549  NA 139 137 137  K 4.2 3.9 3.9  CL 107 102 106  CO2 21* 22 24  GLUCOSE 86 89 93  BUN 34* 28* 21  CREATININE 1.52* 1.35* 1.16*  CALCIUM 9.2 9.3 8.8*  PHOS  --   --  3.4   GFR: Estimated Creatinine Clearance: 31.9 mL/min (A) (by C-G formula based on SCr of 1.16 mg/dL (H)). Liver Function Tests: Recent Labs  Lab 09/19/23 1501 09/20/23 0812 09/22/23 0549   AST 15 12*  --   ALT 9 11  --   ALKPHOS 77 86  --   BILITOT 0.6 0.9  --   PROT 7.2 7.6  --   ALBUMIN 3.5 3.9 3.1*   No results for input(s): "LIPASE", "AMYLASE" in the last 168 hours. No results for input(s): "AMMONIA" in the last 168 hours. Coagulation Profile: No results for input(s): "INR", "PROTIME" in the last 168 hours. Cardiac Enzymes: No results for input(s): "CKTOTAL", "CKMB", "CKMBINDEX", "TROPONINI" in the last 168 hours. BNP (last 3 results) No results for input(s): "PROBNP" in the last 8760 hours. HbA1C: No results for input(s): "HGBA1C" in the last 72 hours. CBG: Recent Labs  Lab 09/19/23 1358  GLUCAP 99   Lipid Profile: No results for input(s): "CHOL", "HDL", "LDLCALC", "TRIG", "CHOLHDL", "LDLDIRECT" in the last 72 hours. Thyroid Function Tests: No results for input(s): "TSH", "T4TOTAL", "FREET4", "T3FREE", "THYROIDAB" in the last 72 hours. Anemia Panel: No results for input(s): "VITAMINB12", "FOLATE", "FERRITIN", "TIBC", "IRON", "RETICCTPCT" in the last 72 hours. Sepsis Labs: Recent Labs  Lab 09/19/23 1530  LATICACIDVEN 0.8    Recent Results (from the past 240 hours)  Urine Culture     Status: Abnormal   Collection Time: 09/19/23  2:14 PM   Specimen: Urine, Random  Result Value Ref Range Status   Specimen Description   Final    URINE, RANDOM Performed at [facility], [address], [place], [place] [address]    Special Requests   Final    NONE Reflexed from [id number] Performed at [facility], [address], [place], [place] [address]    Culture (A)  Final    >=100,000 COLONIES/mL ESCHERICHIA COLI >=100,000 COLONIES/mL ENTEROCOCCUS FAECALIS    Report Status 09/22/2023 FINAL  Final   Organism ID, Bacteria ESCHERICHIA COLI (A)  Final   Organism ID, Bacteria ENTEROCOCCUS FAECALIS (A)  Final      Susceptibility   Escherichia coli - MIC*    AMPICILLIN <=2 SENSITIVE Sensitive     CEFAZOLIN <=4 SENSITIVE  Sensitive     CEFEPIME <=0.12 SENSITIVE Sensitive     CEFTRIAXONE <=0.25 SENSITIVE Sensitive     CIPROFLOXACIN <=0.25 SENSITIVE Sensitive     GENTAMICIN <=1 SENSITIVE Sensitive     IMIPENEM 1 SENSITIVE Sensitive     NITROFURANTOIN 64 INTERMEDIATE Intermediate     TRIMETH/SULFA <=20 SENSITIVE Sensitive     AMPICILLIN/SULBACTAM <=2 SENSITIVE Sensitive     PIP/TAZO <=4 SENSITIVE Sensitive ug/mL    * >=100,000 COLONIES/mL ESCHERICHIA COLI   Enterococcus faecalis - MIC*    AMPICILLIN 8 SENSITIVE Sensitive     NITROFURANTOIN <=16 SENSITIVE Sensitive     VANCOMYCIN 16 INTERMEDIATE Intermediate     * >=  100,000 COLONIES/mL ENTEROCOCCUS FAECALIS         Radiology Studies: No results found.         LOS: 4 days   Time spent= 35 mins    Miguel Rota, MD [facility]  If 7PM-7AM, please contact night-coverage  09/23/2023, 2:02 PM

## 2023-09-23 NOTE — Progress Notes (Addendum)
 Patient pulled her IV out and took off her gown often, trying to get out of the bed. Patient was agitated and screamed. Bed alarm was set up, IV's dressing was changes and covered. Gown and bed linen was changed as well. Patient's hands was placed in mittens for her safety.

## 2023-09-23 NOTE — Plan of Care (Signed)
  Problem: Education: Goal: Knowledge of General Education information will improve Description: Including pain rating scale, medication(s)/side effects and non-pharmacologic comfort measures Outcome: Progressing   Problem: Health Behavior/Discharge Planning: Goal: Ability to manage health-related needs will improve Outcome: Progressing   Problem: Clinical Measurements: Goal: Ability to maintain clinical measurements within normal limits will improve Outcome: Progressing Goal: Will remain free from infection Outcome: Progressing Goal: Diagnostic test results will improve Outcome: Progressing   Problem: Elimination: Goal: Will not experience complications related to urinary retention Outcome: Progressing

## 2023-09-23 NOTE — TOC Progression Note (Signed)
 Transition of Care [facility]) - Progression Note    Patient Details  Name: Allison Chavez MRN: [id number] Date of Birth: January 18, 1941  [facility] CM/SW Contact  Otelia Santee, LCSW Phone Number: 09/23/2023, 2:36 PM  Clinical Narrative:    Reviewed bed offers with pt's spouse. Pt's spouse accepted offer for [facility]. Pt medically ready for SNF. Insurance [employer] has been requested and is currently pending. Need to upload more current PT note once put in.    Expected Discharge Plan: Skilled Nursing Facility Barriers to Discharge: Continued Medical Work up  Expected Discharge Plan and Services In-house Referral: Clinical Social Work   Post Acute Care Choice: Skilled Nursing Facility Living arrangements for the past 2 months: Single Family Home                 DME Arranged: N/A DME Agency: NA                   Social Determinants of Health (SDOH) Interventions SDOH Screenings   Food Insecurity: No Food Insecurity (09/20/2023)  Housing: Low Risk  (09/20/2023)  Transportation Needs: No Transportation Needs (09/20/2023)  Utilities: Not At Risk (09/20/2023)  Alcohol Screen: Low Risk  (12/13/2022)  Depression (PHQ2-9): Low Risk  (06/26/2023)  Financial Resource Strain: Low Risk  (12/13/2022)  Physical Activity: Insufficiently Active (12/13/2022)  Social Connections: Socially Integrated (09/22/2023)  Stress: No Stress Concern Present (12/13/2022)  Tobacco Use: Medium Risk (09/19/2023)    Readmission Risk Interventions    09/23/2023    2:35 PM  Readmission Risk Prevention Plan  Transportation Screening Complete  PCP or Specialist Appt within 5-7 Days Complete  Home Care Screening Complete  Medication Review (RN CM) Complete

## 2023-09-23 NOTE — Telephone Encounter (Signed)
 Paperwork completed and placed in fax bin at back nurse station

## 2023-09-23 NOTE — Plan of Care (Signed)
  Problem: Clinical Measurements: Goal: Will remain free from infection Outcome: Progressing   Problem: Nutrition: Goal: Adequate nutrition will be maintained Outcome: Progressing   Problem: Elimination: Goal: Will not experience complications related to bowel motility Outcome: Progressing   Problem: Pain Managment: Goal: General experience of comfort will improve and/or be controlled Outcome: Progressing   Problem: Safety: Goal: Ability to remain free from injury will improve Outcome: Progressing   Problem: Skin Integrity: Goal: Risk for impaired skin integrity will decrease Outcome: Progressing

## 2023-09-23 NOTE — Progress Notes (Signed)
 Physical Therapy Treatment Patient Details Name: Allison Chavez MRN: [id number] DOB: 1940-09-05 Today's Date: 09/23/2023   History of Present Illness 83 y.o. female admitted 09/19/23 with altered mental status, dysuria and weakness. Dx with acute metabolic encephalopathy, likely secondary to UTI. PMH includes dementia, degenerative disc disease, insomnia, major depression, mild renal insufficiency, hyperlipidemia, essential hypertension, chronic kidney disease stage III.    PT Comments  Pt continues to be confused but did participate with therapy with increased time, cues and encouragement.  She was able to complete multiple OOB transfers and took a few steps.  Had assist of 2 for safety, but generally pt needing min-mod A.  Cont POC. Patient will benefit from continued inpatient follow up therapy, <3 hours/day at d/c.     If plan is discharge home, recommend the following: A lot of help with walking and/or transfers;A lot of help with bathing/dressing/bathroom;Assistance with cooking/housework;Direct supervision/assist for financial management;Assist for transportation;Help with stairs or ramp for entrance   Can travel by private vehicle     No  Equipment Recommendations  None recommended by PT    Recommendations for Other Services       Precautions / Restrictions Precautions Precautions: Fall     Mobility  Bed Mobility Overal bed mobility: Needs Assistance Bed Mobility: Sit to Supine, Sidelying to Sit   Sidelying to sit: Mod assist   Sit to supine: Mod assist   General bed mobility comments: Pt lying on R side , reports L side painful, had pt sit on EOB on L.  Min cues and increased time for legs to EOB, mod A to lift trunk and scoot forward    Transfers Overall transfer level: Needs assistance Equipment used: Rolling walker (2 wheels) Transfers: Sit to/from Stand, Bed to chair/wheelchair/BSC Sit to Stand: Mod assist, +2 safety/equipment Stand pivot transfers: Mod assist, +2  safety/equipment         General transfer comment: STS from bed x 2, bsc x 2 with mod A, increased cues, and time.  Requiring feet blocked to keep from sliding, encouragement, and assist at buttock for posture.    Ambulation/Gait Ambulation/Gait assistance: Min assist Gait Distance (Feet): 5 Feet Assistive device: Rolling walker (2 wheels) Gait Pattern/deviations: Step-to pattern, Trunk flexed Gait velocity: decreased     General Gait Details: Pt taking a few steps forward, back , and then side steps; min A balance and to move RW (cannot grip with L hand)   Stairs             Wheelchair Mobility     Tilt Bed    Modified Rankin (Stroke Patients Only)       Balance Overall balance assessment: Needs assistance Sitting-balance support: Feet supported, Single extremity supported Sitting balance-Leahy Scale: Fair Sitting balance - Comments: Tends to lean R requriing min A at times; could correct with cues; always needs close supervision   Standing balance support: Single extremity supported, Reliant on assistive device for balance Standing balance-Leahy Scale: Poor Standing balance comment: RW and min A                            Communication Communication Communication: No apparent difficulties  Cognition Arousal: Alert Behavior During Therapy: WFL for tasks assessed/performed   PT - Cognitive impairments: History of cognitive impairments, No family/caregiver present to determine baseline  PT - Cognition Comments: oriented to self only, able to state name and birthdate Following commands: Impaired Following commands impaired: Follows one step commands with increased time    Cueing Cueing Techniques: Verbal cues, Tactile cues, Gestural cues  Exercises      General Comments General comments (skin integrity, edema, etc.): Pt wanting to return to bed      Pertinent Vitals/Pain Pain Assessment Pain Assessment:  Faces Faces Pain Scale: Hurts little more Pain Location: L abdomen/flank Pain Descriptors / Indicators: Guarding Pain Intervention(s): Limited activity within patient's tolerance, Monitored during session, Repositioned (slow transfers/altered transfers)    Home Living                          Prior Function            PT Goals (current goals can now be found in the care plan section) Progress towards PT goals: Progressing toward goals    Frequency    Min 1X/week      PT Plan      Co-evaluation              AM-PAC PT "6 Clicks" Mobility   Outcome Measure  Help needed turning from your back to your side while in a flat bed without using bedrails?: A Lot Help needed moving from lying on your back to sitting on the side of a flat bed without using bedrails?: A Lot Help needed moving to and from a bed to a chair (including a wheelchair)?: A Lot Help needed standing up from a chair using your arms (e.g., wheelchair or bedside chair)?: A Lot Help needed to walk in hospital room?: Total Help needed climbing 3-5 steps with a railing? : Total 6 Click Score: 10    End of Session Equipment Utilized During Treatment: Gait belt Activity Tolerance: Other (comment) (limited by cogntion) Patient left: in bed;with call bell/phone within reach;with bed alarm set Nurse Communication: Mobility status PT Visit Diagnosis: Unsteadiness on feet (R26.81);Muscle weakness (generalized) (M62.81);Other abnormalities of gait and mobility (R26.89)     Time: 1610-9604 PT Time Calculation (min) (ACUTE ONLY): 21 min  Charges:    $Therapeutic Activity: 8-22 mins PT General Charges $$ ACUTE PT VISIT: 1 Visit                     Anise Salvo, PT Acute Rehab [facility] [phone]    Rayetta Humphrey 09/23/2023, 3:11 PM

## 2023-09-23 NOTE — Hospital Course (Addendum)
 Brief Narrative:   83 year old female, with past medical history significant for dementia, depression, chronic kidney disease stage III, hypertension, and hyperlipidemia. Patient was admitted with acute metabolic encephalopathy, likely secondary to UTI.  Urine cultures are growing E. coli and Enterococcus. Given 1 dose of fosfomycin.  Completed 3 days of Rocephin  Mentation is stable. PT recommended SNF    Assessment & Plan:  Principal Problem:   Acute metabolic encephalopathy Active Problems:   HTN (hypertension)   Hyperlipidemia   CKD (chronic kidney disease), stage III   Dementia due to Alzheimer's disease   Major depressive disorder   FTT (failure to thrive) in adult   Anemia in chronic kidney disease (CKD)    Acute metabolic encephalopathy likely secondary to urinary tract infection Urine cultures are growing E. coli and Enterococcus - Given 1 dose of fosfomycin.  Completed 3 days of Rocephin    Dementia:  -No behavioral problems.     Essential hypertension:  -Continue to monitor and manage expectantly.     Hyperlipidemia: -Continue home statin   Chronic kidney disease stage IIIb:  -Stable. -Continue to monitor renal function and electrolytes.     Anemia: -Stable. -Likely anemia of chronic inflammation. -Patient has chronic kidney disease.   Major depressive disorder:    Adult failure to thrive: Plans for SNF   DVT prophylaxis: Subcutaneous Lovenox. Code Status:  Family Communication:  Disposition Plan: SNF placement.    Subjective: Doing well no complaints.    Examination:  General exam: Appears calm and comfortable  Respiratory system: Clear to auscultation. Respiratory effort normal. Cardiovascular system: S1 & S2 heard, RRR. No JVD, murmurs, rubs, gallops or clicks. No pedal edema. Gastrointestinal system: Abdomen is nondistended, soft and nontender. No organomegaly or masses felt. Normal bowel sounds heard. Central nervous system: Alert and  oriented. No focal neurological deficits. Extremities: Symmetric 5 x 5 power. Skin: No rashes, lesions or ulcers Psychiatry: Judgement and insight appear normal. Mood & affect appropriate.

## 2023-09-24 DIAGNOSIS — R531 Weakness: Secondary | ICD-10-CM | POA: Diagnosis not present

## 2023-09-24 DIAGNOSIS — R451 Restlessness and agitation: Secondary | ICD-10-CM | POA: Diagnosis not present

## 2023-09-24 DIAGNOSIS — G309 Alzheimer's disease, unspecified: Secondary | ICD-10-CM | POA: Diagnosis not present

## 2023-09-24 DIAGNOSIS — R2689 Other abnormalities of gait and mobility: Secondary | ICD-10-CM | POA: Diagnosis not present

## 2023-09-24 DIAGNOSIS — R404 Transient alteration of awareness: Secondary | ICD-10-CM | POA: Diagnosis not present

## 2023-09-24 DIAGNOSIS — I129 Hypertensive chronic kidney disease with stage 1 through stage 4 chronic kidney disease, or unspecified chronic kidney disease: Secondary | ICD-10-CM | POA: Diagnosis not present

## 2023-09-24 DIAGNOSIS — M47812 Spondylosis without myelopathy or radiculopathy, cervical region: Secondary | ICD-10-CM | POA: Diagnosis not present

## 2023-09-24 DIAGNOSIS — Z7401 Bed confinement status: Secondary | ICD-10-CM | POA: Diagnosis not present

## 2023-09-24 DIAGNOSIS — R6889 Other general symptoms and signs: Secondary | ICD-10-CM | POA: Diagnosis not present

## 2023-09-24 DIAGNOSIS — R296 Repeated falls: Secondary | ICD-10-CM | POA: Diagnosis not present

## 2023-09-24 DIAGNOSIS — N390 Urinary tract infection, site not specified: Secondary | ICD-10-CM | POA: Diagnosis not present

## 2023-09-24 DIAGNOSIS — R278 Other lack of coordination: Secondary | ICD-10-CM | POA: Diagnosis not present

## 2023-09-24 DIAGNOSIS — N189 Chronic kidney disease, unspecified: Secondary | ICD-10-CM | POA: Diagnosis not present

## 2023-09-24 DIAGNOSIS — R627 Adult failure to thrive: Secondary | ICD-10-CM | POA: Diagnosis not present

## 2023-09-24 DIAGNOSIS — I1310 Hypertensive heart and chronic kidney disease without heart failure, with stage 1 through stage 4 chronic kidney disease, or unspecified chronic kidney disease: Secondary | ICD-10-CM | POA: Diagnosis not present

## 2023-09-24 DIAGNOSIS — M6281 Muscle weakness (generalized): Secondary | ICD-10-CM | POA: Diagnosis not present

## 2023-09-24 DIAGNOSIS — Z743 Need for continuous supervision: Secondary | ICD-10-CM | POA: Diagnosis not present

## 2023-09-24 DIAGNOSIS — D638 Anemia in other chronic diseases classified elsewhere: Secondary | ICD-10-CM | POA: Diagnosis not present

## 2023-09-24 DIAGNOSIS — S2232XD Fracture of one rib, left side, subsequent encounter for fracture with routine healing: Secondary | ICD-10-CM | POA: Diagnosis not present

## 2023-09-24 DIAGNOSIS — G9341 Metabolic encephalopathy: Secondary | ICD-10-CM | POA: Diagnosis not present

## 2023-09-24 DIAGNOSIS — R109 Unspecified abdominal pain: Secondary | ICD-10-CM | POA: Diagnosis not present

## 2023-09-24 DIAGNOSIS — R4182 Altered mental status, unspecified: Secondary | ICD-10-CM | POA: Diagnosis present

## 2023-09-24 LAB — BASIC METABOLIC PANEL
Anion gap: 10 (ref 5–15)
BUN: 23 mg/dL (ref 8–23)
CO2: 21 mmol/L — ABNORMAL LOW (ref 22–32)
Calcium: 8.9 mg/dL (ref 8.9–10.3)
Chloride: 106 mmol/L (ref 98–111)
Creatinine, Ser: 1.28 mg/dL — ABNORMAL HIGH (ref 0.44–1.00)
GFR, Estimated: 42 mL/min — ABNORMAL LOW (ref >60)
Glucose, Bld: 86 mg/dL (ref 70–99)
Potassium: 3.5 mmol/L (ref 3.5–5.1)
Sodium: 137 mmol/L (ref 135–145)

## 2023-09-24 LAB — MAGNESIUM: Magnesium: 2 mg/dL (ref 1.7–2.4)

## 2023-09-24 MED ORDER — POTASSIUM CHLORIDE CRYS ER 20 MEQ PO TBCR
40.0000 meq | EXTENDED_RELEASE_TABLET | Freq: Once | ORAL | Status: AC
  Administered 2023-09-24: 40 meq via ORAL
  Filled 2023-09-24: qty 2

## 2023-09-24 NOTE — Discharge Summary (Signed)
 Physician Discharge Summary  Allison Chavez JYN:[id number] DOB: January 09, 1941 DOA: 09/19/2023  PCP: Shade Flood, MD  Admit date: 09/19/2023 Discharge date: 09/24/2023  Admitted From: Home Disposition:  SNF  Recommendations for Outpatient Follow-up:  Follow up with PCP in 1-2 weeks Please obtain BMP/CBC in one week your next doctors visit.    Discharge Condition: Stable CODE STATUS: Full Diet recommendation: Regular   Brief Narrative:   83 year old female, with past medical history significant for dementia, depression, chronic kidney disease stage III, hypertension, and hyperlipidemia. Patient was admitted with acute metabolic encephalopathy, likely secondary to UTI.  Urine cultures are growing E. coli and Enterococcus. Given 1 dose of fosfomycin.  Completed 3 days of Rocephin  Mentation is stable. PT recommended SNF    Assessment & Plan:  Principal Problem:   Acute metabolic encephalopathy Active Problems:   HTN (hypertension)   Hyperlipidemia   CKD (chronic kidney disease), stage III   Dementia due to Alzheimer's disease   Major depressive disorder   FTT (failure to thrive) in adult   Anemia in chronic kidney disease (CKD)    Acute metabolic encephalopathy likely secondary to urinary tract infection Urine cultures are growing E. coli and Enterococcus - Given 1 dose of fosfomycin.  Completed 3 days of Rocephin    Dementia:  -No behavioral problems.     Essential hypertension:  -Continue to monitor and manage expectantly.     Hyperlipidemia: -Continue home statin   Chronic kidney disease stage IIIb:  -Stable. -Continue to monitor renal function and electrolytes.     Anemia: -Stable. -Likely anemia of chronic inflammation. -Patient has chronic kidney disease.   Major depressive disorder:    Adult failure to thrive: Plans for SNF   DVT prophylaxis: Subcutaneous Lovenox. Code Status:  Family Communication:  Disposition Plan: SNF placement.     Subjective: Doing well no complaints.    Examination:  General exam: Appears calm and comfortable  Respiratory system: Clear to auscultation. Respiratory effort normal. Cardiovascular system: S1 & S2 heard, RRR. No JVD, murmurs, rubs, gallops or clicks. No pedal edema. Gastrointestinal system: Abdomen is nondistended, soft and nontender. No organomegaly or masses felt. Normal bowel sounds heard. Central nervous system: Alert and oriented. No focal neurological deficits. Extremities: Symmetric 5 x 5 power. Skin: No rashes, lesions or ulcers Psychiatry: Judgement and insight appear normal. Mood & affect appropriate.    Discharge Diagnoses:  Principal Problem:   Acute metabolic encephalopathy Active Problems:   HTN (hypertension)   Hyperlipidemia   CKD (chronic kidney disease), stage III   Dementia due to Alzheimer's disease   Major depressive disorder   FTT (failure to thrive) in adult   Anemia in chronic kidney disease (CKD)      Discharge Exam: Vitals:   09/23/23 1216 09/23/23 2143  BP: (!) 155/52 (!) 154/84  Pulse: 71 80  Resp:  19  Temp: 98.4 F (36.9 C) 97.9 F (36.6 C)  SpO2: 95% 97%   Vitals:   09/22/23 1937 09/23/23 0403 09/23/23 1216 09/23/23 2143  BP: (!) 140/65 (!) 155/55 (!) 155/52 (!) 154/84  Pulse: 80 76 71 80  Resp: 18 18  19   Temp: 98.4 F (36.9 C) 98.2 F (36.8 C) 98.4 F (36.9 C) 97.9 F (36.6 C)  TempSrc: Oral Oral Oral   SpO2: 95% 97% 95% 97%  Weight:      Height:          Discharge Instructions   Allergies as of 09/24/2023  Reactions   Ace Inhibitors Cough   Cough   Sulfa Antibiotics Hives   Wellbutrin [bupropion] Rash   Only with generic, immediate release        Medication List     STOP taking these medications    citalopram 20 MG tablet Commonly known as: CELEXA   folic acid 1 MG tablet Commonly known as: FOLVITE   hydrOXYzine 25 MG tablet Commonly known as: ATARAX   memantine 10 MG  tablet Commonly known as: NAMENDA   predniSONE 20 MG tablet Commonly known as: DELTASONE   PreserVision AREDS 2+Multi Vit Caps   triamcinolone cream 0.1 % Commonly known as: KENALOG   Womens One Daily Tabs       TAKE these medications    thiamine 100 MG tablet Commonly known as: VITAMIN B1 Take 1 tablet (100 mg total) by mouth daily.        Contact information for follow-up providers     Shade Flood, MD Follow up in 1 week(s).   Specialties: Family Medicine, Sports Medicine Contact information: [address] [place] [address] [phone]              Contact information for after-discharge care     Destination     [facility] Preferred SNF .   Service: Skilled Nursing Contact information: [address] [place] [address] [phone]                    Allergies  Allergen Reactions   Ace Inhibitors Cough    Cough    Sulfa Antibiotics Hives   Wellbutrin [Bupropion] Rash    Only with generic, immediate release    You were cared for by a hospitalist during your hospital stay. If you have any questions about your discharge medications or the care you received while you were in the hospital after you are discharged, you can call the unit and asked to speak with the hospitalist on call if the hospitalist that took care of you is not available. Once you are discharged, your primary care physician will handle any further medical issues. Please note that no refills for any discharge medications will be authorized once you are discharged, as it is imperative that you return to your primary care physician (or establish a relationship with a primary care physician if you do not have one) for your aftercare needs so that they can reassess your need for medications and monitor your lab values.  You were cared for by a hospitalist during your hospital stay. If you have any questions about your  discharge medications or the care you received while you were in the hospital after you are discharged, you can call the unit and asked to speak with the hospitalist on call if the hospitalist that took care of you is not available. Once you are discharged, your primary care physician will handle any further medical issues. Please note that NO REFILLS for any discharge medications will be authorized once you are discharged, as it is imperative that you return to your primary care physician (or establish a relationship with a primary care physician if you do not have one) for your aftercare needs so that they can reassess your need for medications and monitor your lab values.  Please request your Prim.MD to go over all Hospital Tests and Procedure/Radiological results at the follow up, please get all Hospital records sent to your Prim MD by signing hospital release before  you go home.  Get CBC, CMP, 2 view Chest X ray checked  by Primary MD during your next visit or SNF MD in 5-7 days ( we routinely change or add medications that can affect your baseline labs and fluid status, therefore we recommend that you get the mentioned basic workup next visit with your PCP, your PCP may decide not to get them or add new tests based on their clinical decision)  On your next visit with your primary care physician please Get Medicines reviewed and adjusted.  If you experience worsening of your admission symptoms, develop shortness of breath, life threatening emergency, suicidal or homicidal thoughts you must seek medical attention immediately by calling 911 or calling your MD immediately  if symptoms less severe.  You Must read complete instructions/literature along with all the possible adverse reactions/side effects for all the Medicines you take and that have been prescribed to you. Take any new Medicines after you have completely understood and accpet all the possible adverse reactions/side effects.   Do not  drive, operate heavy machinery, perform activities at heights, swimming or participation in water activities or provide baby sitting services if your were admitted for syncope or siezures until you have seen by Primary MD or a Neurologist and advised to do so again.  Do not drive when taking Pain medications.   Procedures/Studies: DG Chest Port 1 View Result Date: 09/19/2023 CLINICAL DATA:  Altered mental status. EXAM: PORTABLE CHEST 1 VIEW COMPARISON:  June 26, 2023. FINDINGS: Stable cardiomegaly. Both lungs are clear. Old left rib fractures are noted. IMPRESSION: No active disease. Electronically Signed   By: Lupita Raider M.D.   On: 09/19/2023 14:58   CT Head Wo Contrast Result Date: 09/19/2023 CLINICAL DATA:  Mental status change, unknown cause EXAM: CT HEAD WITHOUT CONTRAST TECHNIQUE: Contiguous axial images were obtained from the base of the skull through the vertex without intravenous contrast. RADIATION DOSE REDUCTION: This exam was performed according to the departmental dose-optimization program which includes automated exposure control, adjustment of the mA and/or kV according to patient size and/or use of iterative reconstruction technique. COMPARISON:  CT head 07/03/2023. FINDINGS: Brain: No evidence of acute infarction, hemorrhage, hydrocephalus, extra-axial collection or mass lesion/mass effect. Similar cerebral atrophy with ex vacuo ventricular dilation. Similar patchy white matter hypodensities, compatible with chronic microvascular ischemic disease. Vascular: No hyperdense vessel identified. Skull: No acute fracture. Sinuses/Orbits: Clear sinuses.  No acute findings. Other: No mastoid effusions. IMPRESSION: 1. Stable head CT.  No evidence of acute intracranial abnormality. 2.  Cerebral Atrophy (ICD10-G31.9). Electronically Signed   By: Feliberto Harts M.D.   On: 09/19/2023 14:42     The results of significant diagnostics from this hospitalization (including imaging,  microbiology, ancillary and laboratory) are listed below for reference.     Microbiology: Recent Results (from the past 240 hours)  Urine Culture     Status: Abnormal   Collection Time: 09/19/23  2:14 PM   Specimen: Urine, Random  Result Value Ref Range Status   Specimen Description   Final    URINE, RANDOM Performed at [facility], [address] [place], [place] [address]    Special Requests   Final    NONE Reflexed from [phone] [id number] Performed at [facility], [address], [place], [place] [address]    Culture (A)  Final    >=100,000 COLONIES/mL ESCHERICHIA COLI >=100,000 COLONIES/mL ENTEROCOCCUS FAECALIS    Report Status 09/22/2023 FINAL  Final   Organism  ID, Bacteria ESCHERICHIA COLI (A)  Final   Organism ID, Bacteria ENTEROCOCCUS FAECALIS (A)  Final      Susceptibility   Escherichia coli - MIC*    AMPICILLIN <=2 SENSITIVE Sensitive     CEFAZOLIN <=4 SENSITIVE Sensitive     CEFEPIME <=0.12 SENSITIVE Sensitive     CEFTRIAXONE <=0.25 SENSITIVE Sensitive     CIPROFLOXACIN <=0.25 SENSITIVE Sensitive     GENTAMICIN <=1 SENSITIVE Sensitive     IMIPENEM 1 SENSITIVE Sensitive     NITROFURANTOIN 64 INTERMEDIATE Intermediate     TRIMETH/SULFA <=20 SENSITIVE Sensitive     AMPICILLIN/SULBACTAM <=2 SENSITIVE Sensitive     PIP/TAZO <=4 SENSITIVE Sensitive ug/mL    * >=100,000 COLONIES/mL ESCHERICHIA COLI   Enterococcus faecalis - MIC*    AMPICILLIN 8 SENSITIVE Sensitive     NITROFURANTOIN <=16 SENSITIVE Sensitive     VANCOMYCIN 16 INTERMEDIATE Intermediate     * >=100,000 COLONIES/mL ENTEROCOCCUS FAECALIS     Labs: BNP (last 3 results) No results for input(s): "BNP" in the last 8760 hours. Basic Metabolic Panel: Recent Labs  Lab 09/19/23 1501 09/20/23 0812 09/22/23 0549 09/24/23 0520  NA 139 137 137 137  K 4.2 3.9 3.9 3.5  CL 107 102 106 106  CO2 21* 22 24 21*  GLUCOSE 86 89 93 86  BUN 34* 28* 21 23  CREATININE 1.52* 1.35* 1.16*  1.28*  CALCIUM 9.2 9.3 8.8* 8.9  MG  --   --   --  2.0  PHOS  --   --  3.4  --    Liver Function Tests: Recent Labs  Lab 09/19/23 1501 09/20/23 0812 09/22/23 0549  AST 15 12*  --   ALT 9 11  --   ALKPHOS 77 86  --   BILITOT 0.6 0.9  --   PROT 7.2 7.6  --   ALBUMIN 3.5 3.9 3.1*   No results for input(s): "LIPASE", "AMYLASE" in the last 168 hours. No results for input(s): "AMMONIA" in the last 168 hours. CBC: Recent Labs  Lab 09/19/23 1501 09/20/23 0812 09/23/23 1121  WBC 10.9* 8.9 6.4  NEUTROABS 5.0  --   --   HGB 9.6* 10.4* 9.5*  HCT 32.2* 32.1* 29.2*  MCV 100.0 95.8 92.4  PLT 248 270 215   Cardiac Enzymes: No results for input(s): "CKTOTAL", "CKMB", "CKMBINDEX", "TROPONINI" in the last 168 hours. BNP: Invalid input(s): "POCBNP" CBG: Recent Labs  Lab 09/19/23 1358  GLUCAP 99   D-Dimer No results for input(s): "DDIMER" in the last 72 hours. Hgb A1c No results for input(s): "HGBA1C" in the last 72 hours. Lipid Profile No results for input(s): "CHOL", "HDL", "LDLCALC", "TRIG", "CHOLHDL", "LDLDIRECT" in the last 72 hours. Thyroid function studies No results for input(s): "TSH", "T4TOTAL", "T3FREE", "THYROIDAB" in the last 72 hours.  Invalid input(s): "FREET3" Anemia work up No results for input(s): "VITAMINB12", "FOLATE", "FERRITIN", "TIBC", "IRON", "RETICCTPCT" in the last 72 hours. Urinalysis    Component Value Date/Time   COLORURINE YELLOW 09/19/2023 1414   APPEARANCEUR HAZY (A) 09/19/2023 1414   APPEARANCEUR Clear 09/26/2017 1341   LABSPEC 1.017 09/19/2023 1414   PHURINE 7.0 09/19/2023 1414   GLUCOSEU NEGATIVE 09/19/2023 1414   HGBUR NEGATIVE 09/19/2023 1414   BILIRUBINUR NEGATIVE 09/19/2023 1414   BILIRUBINUR Negative 09/26/2017 1341   KETONESUR NEGATIVE 09/19/2023 1414   PROTEINUR NEGATIVE 09/19/2023 1414   UROBILINOGEN 0.2 01/22/2017 1544   UROBILINOGEN 0.2 07/28/2016 1913   NITRITE NEGATIVE 09/19/2023 1414   LEUKOCYTESUR MODERATE (  A)  09/19/2023 1414   Sepsis Labs Recent Labs  Lab 09/19/23 1501 09/20/23 0812 09/23/23 1121  WBC 10.9* 8.9 6.4   Microbiology Recent Results (from the past 240 hours)  Urine Culture     Status: Abnormal   Collection Time: 09/19/23  2:14 PM   Specimen: Urine, Random  Result Value Ref Range Status   Specimen Description   Final    URINE, RANDOM Performed at [facility], [address] [place], [place] [address]    Special Requests   Final    NONE Reflexed from [id number] Performed at [facility], [address], [place], [place] [address]    Culture (A)  Final    >=100,000 COLONIES/mL ESCHERICHIA COLI >=100,000 COLONIES/mL ENTEROCOCCUS FAECALIS    Report Status 09/22/2023 FINAL  Final   Organism ID, Bacteria ESCHERICHIA COLI (A)  Final   Organism ID, Bacteria ENTEROCOCCUS FAECALIS (A)  Final      Susceptibility   Escherichia coli - MIC*    AMPICILLIN <=2 SENSITIVE Sensitive     CEFAZOLIN <=4 SENSITIVE Sensitive     CEFEPIME <=0.12 SENSITIVE Sensitive     CEFTRIAXONE <=0.25 SENSITIVE Sensitive     CIPROFLOXACIN <=0.25 SENSITIVE Sensitive     GENTAMICIN <=1 SENSITIVE Sensitive     IMIPENEM 1 SENSITIVE Sensitive     NITROFURANTOIN 64 INTERMEDIATE Intermediate     TRIMETH/SULFA <=20 SENSITIVE Sensitive     AMPICILLIN/SULBACTAM <=2 SENSITIVE Sensitive     PIP/TAZO <=4 SENSITIVE Sensitive ug/mL    * >=100,000 COLONIES/mL ESCHERICHIA COLI   Enterococcus faecalis - MIC*    AMPICILLIN 8 SENSITIVE Sensitive     NITROFURANTOIN <=16 SENSITIVE Sensitive     VANCOMYCIN 16 INTERMEDIATE Intermediate     * >=100,000 COLONIES/mL ENTEROCOCCUS FAECALIS     Time coordinating discharge:  I have spent 35 minutes face to face with the patient and on the ward discussing the patients care, assessment, plan and disposition with other care givers. >50% of the time was devoted counseling the patient about the risks and benefits of treatment/Discharge  disposition and coordinating care.   SIGNED:   Miguel Rota, MD  [facility] 09/24/2023, 9:11 AM   If 7PM-7AM, please contact night-coverage

## 2023-09-24 NOTE — Progress Notes (Signed)
 Report is given to RN of [employer].

## 2023-09-24 NOTE — TOC Transition Note (Signed)
 Transition of Care [facility]) - Discharge Note   Patient Details  Name: Allison Chavez MRN: [id number] Date of Birth: 1941/03/09  Transition [facility]) CM/SW Contact:  Otelia Santee, LCSW Phone Number: 09/24/2023, 9:46 AM   Clinical Narrative:    Pt's insurance approved for SNF. Pt able to transfer to [facility] today. Pt will be going to room 803B. RN to call report to [phone]. Spoke with pt's spouse and confirmed discharge plans. DC packet placed at RN station. PTAR called at 9:57am for transportation.    Final next level of care: Skilled Nursing Facility Barriers to Discharge: Barriers Resolved   Patient Goals and CMS Choice Patient states their goals for this hospitalization and ongoing recovery are:: Go to rehab CMS Medicare.gov Compare Post Acute Care list provided to:: Patient Represenative (must comment) Ronalee Belts (husband)) Choice offered to / list presented to : Spouse Esp[employer] ownership interest in [facility].provided to:: Spouse    Discharge Placement PASRR number recieved: 09/21/23            Patient chooses bed at: [facility] Patient to be transferred to facility by: PTAR Name of family member notified: Spouse Patient and family notified of of transfer: 09/24/23  Discharge Plan and Services Additional resources added to the After Visit Summary for   In-house Referral: Clinical Social Work   Post Acute Care Choice: Skilled Nursing Facility          DME Arranged: N/A DME Agency: NA                  Social Drivers of Health (SDOH) Interventions SDOH Screenings   Food Insecurity: No Food Insecurity (09/20/2023)  Housing: Low Risk  (09/20/2023)  Transportation Needs: No Transportation Needs (09/20/2023)  Utilities: Not At Risk (09/20/2023)  Alcohol Screen: Low Risk  (12/13/2022)  Depression (PHQ2-9): Low Risk  (06/26/2023)  Financial Resource Strain: Low Risk  (12/13/2022)  Physical Activity: Insufficiently Active (12/13/2022)   Social Connections: Socially Integrated (09/22/2023)  Stress: No Stress Concern Present (12/13/2022)  Tobacco Use: Medium Risk (09/19/2023)     Readmission Risk Interventions    09/23/2023    2:35 PM  Readmission Risk Prevention Plan  Transportation Screening Complete  PCP or Specialist Appt within 5-7 Days Complete  Home Care Screening Complete  Medication Review (RN CM) Complete

## 2023-09-24 NOTE — Care Management Important Message (Signed)
 Important Message  Patient Details IM Letter given. Name: ABRISH ERNY MRN: [id number] Date of Birth: [birth year]   Important Message Given:  Yes - Medicare IM     Caren Macadam 09/24/2023, 10:18 AM

## 2023-09-25 DIAGNOSIS — D638 Anemia in other chronic diseases classified elsewhere: Secondary | ICD-10-CM | POA: Diagnosis not present

## 2023-09-25 DIAGNOSIS — G9341 Metabolic encephalopathy: Secondary | ICD-10-CM | POA: Diagnosis not present

## 2023-09-25 DIAGNOSIS — I1310 Hypertensive heart and chronic kidney disease without heart failure, with stage 1 through stage 4 chronic kidney disease, or unspecified chronic kidney disease: Secondary | ICD-10-CM | POA: Diagnosis not present

## 2023-09-25 DIAGNOSIS — N390 Urinary tract infection, site not specified: Secondary | ICD-10-CM | POA: Diagnosis not present

## 2023-09-27 DIAGNOSIS — R2689 Other abnormalities of gait and mobility: Secondary | ICD-10-CM | POA: Diagnosis not present

## 2023-09-27 DIAGNOSIS — G309 Alzheimer's disease, unspecified: Secondary | ICD-10-CM | POA: Diagnosis not present

## 2023-09-27 DIAGNOSIS — M47812 Spondylosis without myelopathy or radiculopathy, cervical region: Secondary | ICD-10-CM | POA: Diagnosis not present

## 2023-09-27 DIAGNOSIS — I1310 Hypertensive heart and chronic kidney disease without heart failure, with stage 1 through stage 4 chronic kidney disease, or unspecified chronic kidney disease: Secondary | ICD-10-CM | POA: Diagnosis not present

## 2023-09-27 DIAGNOSIS — G9341 Metabolic encephalopathy: Secondary | ICD-10-CM | POA: Diagnosis not present

## 2023-09-27 DIAGNOSIS — S2232XD Fracture of one rib, left side, subsequent encounter for fracture with routine healing: Secondary | ICD-10-CM | POA: Diagnosis not present

## 2023-09-27 DIAGNOSIS — M6281 Muscle weakness (generalized): Secondary | ICD-10-CM | POA: Diagnosis not present

## 2023-09-27 DIAGNOSIS — D638 Anemia in other chronic diseases classified elsewhere: Secondary | ICD-10-CM | POA: Diagnosis not present

## 2023-09-27 DIAGNOSIS — N390 Urinary tract infection, site not specified: Secondary | ICD-10-CM | POA: Diagnosis not present

## 2023-09-30 DIAGNOSIS — D638 Anemia in other chronic diseases classified elsewhere: Secondary | ICD-10-CM | POA: Diagnosis not present

## 2023-09-30 DIAGNOSIS — I1310 Hypertensive heart and chronic kidney disease without heart failure, with stage 1 through stage 4 chronic kidney disease, or unspecified chronic kidney disease: Secondary | ICD-10-CM | POA: Diagnosis not present

## 2023-09-30 DIAGNOSIS — N390 Urinary tract infection, site not specified: Secondary | ICD-10-CM | POA: Diagnosis not present

## 2023-09-30 DIAGNOSIS — G9341 Metabolic encephalopathy: Secondary | ICD-10-CM | POA: Diagnosis not present

## 2023-10-01 ENCOUNTER — Telehealth: Payer: Self-pay

## 2023-10-01 DIAGNOSIS — I1310 Hypertensive heart and chronic kidney disease without heart failure, with stage 1 through stage 4 chronic kidney disease, or unspecified chronic kidney disease: Secondary | ICD-10-CM | POA: Diagnosis not present

## 2023-10-01 DIAGNOSIS — D638 Anemia in other chronic diseases classified elsewhere: Secondary | ICD-10-CM | POA: Diagnosis not present

## 2023-10-01 DIAGNOSIS — R2689 Other abnormalities of gait and mobility: Secondary | ICD-10-CM | POA: Diagnosis not present

## 2023-10-01 DIAGNOSIS — M6281 Muscle weakness (generalized): Secondary | ICD-10-CM | POA: Diagnosis not present

## 2023-10-01 DIAGNOSIS — G9341 Metabolic encephalopathy: Secondary | ICD-10-CM | POA: Diagnosis not present

## 2023-10-01 DIAGNOSIS — N390 Urinary tract infection, site not specified: Secondary | ICD-10-CM | POA: Diagnosis not present

## 2023-10-01 DIAGNOSIS — G309 Alzheimer's disease, unspecified: Secondary | ICD-10-CM | POA: Diagnosis not present

## 2023-10-01 NOTE — Telephone Encounter (Signed)
 Copied from CRM [id number]. Topic: General - Other >> Oct 01, 2023 10:17 AM Elizebeth Brooking wrote: Reason for CRM: [facility] called in regards to patients forms being received inform her per note that it has been completed and placed in fax bin to be faxed .  She stated to please call her at  [phone] ext [phone]

## 2023-10-01 NOTE — Telephone Encounter (Signed)
 Did call [facility] to double clarify CRM message due to being unclear of what they are looking for. Left VM at the number and Ext left.

## 2023-10-02 ENCOUNTER — Telehealth: Payer: Self-pay | Admitting: Family Medicine

## 2023-10-02 DIAGNOSIS — G9341 Metabolic encephalopathy: Secondary | ICD-10-CM | POA: Diagnosis not present

## 2023-10-02 DIAGNOSIS — N390 Urinary tract infection, site not specified: Secondary | ICD-10-CM | POA: Diagnosis not present

## 2023-10-02 DIAGNOSIS — I1310 Hypertensive heart and chronic kidney disease without heart failure, with stage 1 through stage 4 chronic kidney disease, or unspecified chronic kidney disease: Secondary | ICD-10-CM | POA: Diagnosis not present

## 2023-10-02 DIAGNOSIS — D638 Anemia in other chronic diseases classified elsewhere: Secondary | ICD-10-CM | POA: Diagnosis not present

## 2023-10-02 NOTE — Telephone Encounter (Signed)
 Placed in folder at nurse station

## 2023-10-02 NOTE — Telephone Encounter (Signed)
 Faxed form and placed in the "faxed folder" in front office

## 2023-10-02 NOTE — Telephone Encounter (Signed)
 Home Health Certification or Plan of Care Tracking  Is this a Certification or Plan of Care? Plan of Care  [facility] Agency: [facility]  Order Number:  [id number]  Has charge sheet been attached? Yes  Where has form been placed:   Labeled & placed in provider bin

## 2023-10-02 NOTE — Telephone Encounter (Signed)
 Paperwork completed and placed in fax bin at back nurse station

## 2023-10-03 NOTE — Telephone Encounter (Signed)
 Called again no answer.  Closing note

## 2023-10-04 DIAGNOSIS — M6281 Muscle weakness (generalized): Secondary | ICD-10-CM | POA: Diagnosis not present

## 2023-10-04 DIAGNOSIS — R2689 Other abnormalities of gait and mobility: Secondary | ICD-10-CM | POA: Diagnosis not present

## 2023-10-04 DIAGNOSIS — G309 Alzheimer's disease, unspecified: Secondary | ICD-10-CM | POA: Diagnosis not present

## 2023-10-04 DIAGNOSIS — G9341 Metabolic encephalopathy: Secondary | ICD-10-CM | POA: Diagnosis not present

## 2023-10-06 ENCOUNTER — Emergency Department (HOSPITAL_COMMUNITY)
Admission: EM | Admit: 2023-10-06 | Discharge: 2023-10-07 | Disposition: A | Discharge status: Skilled Nursing Facility | Attending: Emergency Medicine | Admitting: Emergency Medicine

## 2023-10-06 ENCOUNTER — Other Ambulatory Visit: Payer: Self-pay

## 2023-10-06 ENCOUNTER — Encounter (HOSPITAL_COMMUNITY): Payer: Self-pay

## 2023-10-06 DIAGNOSIS — R451 Restlessness and agitation: Secondary | ICD-10-CM | POA: Insufficient documentation

## 2023-10-06 DIAGNOSIS — I129 Hypertensive chronic kidney disease with stage 1 through stage 4 chronic kidney disease, or unspecified chronic kidney disease: Secondary | ICD-10-CM | POA: Insufficient documentation

## 2023-10-06 DIAGNOSIS — R4182 Altered mental status, unspecified: Secondary | ICD-10-CM | POA: Diagnosis present

## 2023-10-06 DIAGNOSIS — R404 Transient alteration of awareness: Secondary | ICD-10-CM | POA: Diagnosis not present

## 2023-10-06 DIAGNOSIS — Z743 Need for continuous supervision: Secondary | ICD-10-CM | POA: Diagnosis not present

## 2023-10-06 DIAGNOSIS — N189 Chronic kidney disease, unspecified: Secondary | ICD-10-CM | POA: Insufficient documentation

## 2023-10-06 DIAGNOSIS — R531 Weakness: Secondary | ICD-10-CM | POA: Diagnosis not present

## 2023-10-06 LAB — URINALYSIS, W/ REFLEX TO CULTURE (INFECTION SUSPECTED)
Bilirubin Urine: NEGATIVE
Glucose, UA: NEGATIVE mg/dL
Ketones, ur: NEGATIVE mg/dL
Nitrite: NEGATIVE
Protein, ur: NEGATIVE mg/dL
Specific Gravity, Urine: 1.006 (ref 1.005–1.030)
WBC, UA: >50 WBC/hpf (ref 0–5)
pH: 6 (ref 5.0–8.0)

## 2023-10-06 LAB — CBC WITH DIFFERENTIAL/PLATELET
Abs Immature Granulocytes: 0.25 10*3/uL — ABNORMAL HIGH (ref 0.00–0.07)
Basophils Absolute: 0 10*3/uL (ref 0.0–0.1)
Basophils Relative: 1 %
Eosinophils Absolute: 0.1 10*3/uL (ref 0.0–0.5)
Eosinophils Relative: 1 %
HCT: 29.9 % — ABNORMAL LOW (ref 36.0–46.0)
Hemoglobin: 9.4 g/dL — ABNORMAL LOW (ref 12.0–15.0)
Immature Granulocytes: 4 %
Lymphocytes Relative: 34 %
Lymphs Abs: 2.3 10*3/uL (ref 0.7–4.0)
MCH: 30.7 pg (ref 26.0–34.0)
MCHC: 31.4 g/dL (ref 30.0–36.0)
MCV: 97.7 fL (ref 80.0–100.0)
Monocytes Absolute: 1.3 10*3/uL — ABNORMAL HIGH (ref 0.1–1.0)
Monocytes Relative: 19 %
Neutro Abs: 2.7 10*3/uL (ref 1.7–7.7)
Neutrophils Relative %: 41 %
Platelets: 227 10*3/uL (ref 150–400)
RBC: 3.06 MIL/uL — ABNORMAL LOW (ref 3.87–5.11)
RDW: 19.1 % — ABNORMAL HIGH (ref 11.5–15.5)
WBC: 6.6 10*3/uL (ref 4.0–10.5)
nRBC: 0 % (ref 0.0–0.2)

## 2023-10-06 LAB — COMPREHENSIVE METABOLIC PANEL
ALT: 10 U/L (ref 0–44)
AST: 16 U/L (ref 15–41)
Albumin: 3.3 g/dL — ABNORMAL LOW (ref 3.5–5.0)
Alkaline Phosphatase: 92 U/L (ref 38–126)
Anion gap: 11 (ref 5–15)
BUN: 24 mg/dL — ABNORMAL HIGH (ref 8–23)
CO2: 21 mmol/L — ABNORMAL LOW (ref 22–32)
Calcium: 8.6 mg/dL — ABNORMAL LOW (ref 8.9–10.3)
Chloride: 105 mmol/L (ref 98–111)
Creatinine, Ser: 1.44 mg/dL — ABNORMAL HIGH (ref 0.44–1.00)
GFR, Estimated: 37 mL/min — ABNORMAL LOW (ref >60)
Glucose, Bld: 108 mg/dL — ABNORMAL HIGH (ref 70–99)
Potassium: 4.3 mmol/L (ref 3.5–5.1)
Sodium: 137 mmol/L (ref 135–145)
Total Bilirubin: 0.6 mg/dL (ref 0.0–1.2)
Total Protein: 6.6 g/dL (ref 6.5–8.1)

## 2023-10-06 NOTE — Discharge Instructions (Addendum)
 Ms. Allison Chavez has reassuring and normal labs. She hasn't had any outburst here and has been cooperative.  Please continue to monitor closely.

## 2023-10-06 NOTE — ED Notes (Signed)
 Urine sample sent to lab

## 2023-10-06 NOTE — ED Notes (Signed)
 PTAR called for pt transport back to [facility]

## 2023-10-06 NOTE — ED Provider Notes (Signed)
 [place] EMERGENCY DEPARTMENT AT [facility] Provider Note   CSN: [id number] Arrival date & time: 10/06/23  1513     History  Chief Complaint  Patient presents with   Altered Mental Status    Allison Chavez is a 83 y.o. female.  HPI     83 year old patient comes to the emergency room with chief complaint of altered mental status.  Patient resides at [facility] rehab facility.  According to the facility, patient was combative, and hostile today which is not normal for her.   Patient indicated that she has no pain anywhere.  She was frustrated earlier, but does not recall being belligerent.  Patient has past medical history of hypertension, hyperlipidemia, CKD.  Patient is oriented to self, location and year. Review of system is negative for any fevers, chills, cough, URI-like symptoms, abdominal pain, UTI symptoms.  Home Medications Prior to Admission medications   Medication Sig Start Date End Date Taking? Authorizing Provider  thiamine 100 MG tablet Take 1 tablet (100 mg total) by mouth daily. 12/28/17   Albertine Grates, MD      Allergies    Ace inhibitors, Sulfa antibiotics, and Wellbutrin [bupropion]    Review of Systems   Review of Systems  Physical Exam Updated Vital Signs BP (!) 126/59 (BP Location: Right Arm)   Pulse 74   Temp (!) 97.5 F (36.4 C) (Oral)   Resp 17   Ht 5\' 1"  (1.549 m)   Wt 59 kg   SpO2 98%   BMI 24.58 kg/m  Physical Exam Vitals and nursing note reviewed.  Constitutional:      Appearance: She is well-developed.  HENT:     Head: Atraumatic.  Cardiovascular:     Rate and Rhythm: Normal rate.  Pulmonary:     Effort: Pulmonary effort is normal.     Breath sounds: No wheezing.  Musculoskeletal:     Cervical back: Normal range of motion and neck supple.  Skin:    General: Skin is warm and dry.  Neurological:     Mental Status: She is alert and oriented to person, place, and time.     ED Results / Procedures / Treatments    Labs (all labs ordered are listed, but only abnormal results are displayed) Labs Reviewed  CBC WITH DIFFERENTIAL/PLATELET - Abnormal; Notable for the following components:      Result Value   RBC 3.06 (*)    Hemoglobin 9.4 (*)    HCT 29.9 (*)    RDW 19.1 (*)    Monocytes Absolute 1.3 (*)    Abs Immature Granulocytes 0.25 (*)    All other components within normal limits  COMPREHENSIVE METABOLIC PANEL - Abnormal; Notable for the following components:   CO2 21 (*)    Glucose, Bld 108 (*)    BUN 24 (*)    Creatinine, Ser 1.44 (*)    Calcium 8.6 (*)    Albumin 3.3 (*)    GFR, Estimated 37 (*)    All other components within normal limits  URINALYSIS, W/ REFLEX TO CULTURE (INFECTION SUSPECTED)    EKG None  Radiology No results found.  Procedures Procedures    Medications Ordered in ED Medications - No data to display  ED Course/ Medical Decision Making/ A&P                                 Medical Decision Making Amount  and/or Complexity of Data Reviewed Labs: ordered.   83 year old patient with pertinent past medical history of hypertension, CKD comes in with chief complaint of agitation.  Collateral history provided by nursing home.  I have reviewed patient's previous records including the medications.  Differential diagnosis considered for this patient includes: ICH / Stroke, acute coronary syndrome, Infection - UTI/Pneumonia/soft tissue infection leading to encephalopathy, encephalopathy due to electrolyte abnormality or drug interactions or toxins or metabolic conditions like adrenal insufficiency, hyperglycemia, paraneoplastic process.  Based on initial assessment, higher suspicion for ? Uti, electrolyte abnml.  Plan is to get basic labs and initiate p.o. challenge.  Clinically patient has no flulike symptoms.  There is no fevers and patient denies any cough, shortness of breath.  Reassessment: I have independently reviewed the following labs: CBC is  reassuring.  Patient has slightly elevated creatinine, but baseline.  I have independently reviewed the following imaging:  Patient was also reassessed at 9 PM and again at 10 20 p.m.  Patient remains alert, oriented and calm, cooperative.  Urine finally sent for further evaluation. Based on the overall workup, suspicion is high for possible delirium or agitation. Disposition: Discharge.   Final Clinical Impression(s) / ED Diagnoses Final diagnoses:  Agitation    Rx / DC Orders ED Discharge Orders     None         Derwood Kaplan, MD 10/06/23 2224

## 2023-10-06 NOTE — ED Triage Notes (Signed)
 Atient brought in by EMS from [facility] for being "more altered than normal." Reports that today patient was combative, which is not normal behavior for patient. Patient A&O X 3 and appears to be at her baseline mentation status. Pt does have a history of dementia. Patient reports that she does not have any complaints and that "its just [place] in [place] neighborhood."

## 2023-10-07 DIAGNOSIS — R296 Repeated falls: Secondary | ICD-10-CM | POA: Diagnosis not present

## 2023-10-07 DIAGNOSIS — G9341 Metabolic encephalopathy: Secondary | ICD-10-CM | POA: Diagnosis not present

## 2023-10-07 DIAGNOSIS — D638 Anemia in other chronic diseases classified elsewhere: Secondary | ICD-10-CM | POA: Diagnosis not present

## 2023-10-07 DIAGNOSIS — Z743 Need for continuous supervision: Secondary | ICD-10-CM | POA: Diagnosis not present

## 2023-10-07 DIAGNOSIS — Z7401 Bed confinement status: Secondary | ICD-10-CM | POA: Diagnosis not present

## 2023-10-07 DIAGNOSIS — N390 Urinary tract infection, site not specified: Secondary | ICD-10-CM | POA: Diagnosis not present

## 2023-10-07 DIAGNOSIS — I1310 Hypertensive heart and chronic kidney disease without heart failure, with stage 1 through stage 4 chronic kidney disease, or unspecified chronic kidney disease: Secondary | ICD-10-CM | POA: Diagnosis not present

## 2023-10-07 DIAGNOSIS — R404 Transient alteration of awareness: Secondary | ICD-10-CM | POA: Diagnosis not present

## 2023-10-07 MED ORDER — CEPHALEXIN 500 MG PO CAPS: 500.0000 mg | ORAL_CAPSULE | Freq: Two times a day (BID) | ORAL | 0 refills | Status: DC

## 2023-10-07 MED ORDER — SODIUM CHLORIDE 0.9 % IV SOLN
1.0000 g | Freq: Once | INTRAVENOUS | Status: AC
  Administered 2023-10-07: 1 g via INTRAVENOUS
  Filled 2023-10-07: qty 10

## 2023-10-07 NOTE — ED Notes (Signed)
 Patient agreed IV Antibiotic.

## 2023-10-07 NOTE — ED Provider Notes (Signed)
 I was asked to follow-up on the patient's urinalysis prior to discharge.  Her urinalysis does shows findings concerning for infection.  The patient is given a dose of Rocephin and is discharged with Keflex.  She is discharged with return precautions.  Physical Exam  BP (!) 126/59 (BP Location: Right Arm)   Pulse 74   Temp (!) 97.5 F (36.4 C) (Oral)   Resp 17   Ht 5\' 1"  (1.549 m)   Wt 59 kg   SpO2 98%   BMI 24.58 kg/m   Physical Exam General: No acute distress  Procedures  Procedures  ED Course / MDM    Medical Decision Making Amount and/or Complexity of Data Reviewed Labs: ordered.  Risk Prescription drug management.          Durwin Glaze, MD 10/07/23 [phone]

## 2023-10-07 NOTE — ED Notes (Signed)
 Patient refused to start IV. Patient stated Im gonna take Oral Antibiotic instead.

## 2023-10-08 DIAGNOSIS — R2689 Other abnormalities of gait and mobility: Secondary | ICD-10-CM | POA: Diagnosis not present

## 2023-10-08 DIAGNOSIS — G9341 Metabolic encephalopathy: Secondary | ICD-10-CM | POA: Diagnosis not present

## 2023-10-08 DIAGNOSIS — M6281 Muscle weakness (generalized): Secondary | ICD-10-CM | POA: Diagnosis not present

## 2023-10-08 DIAGNOSIS — G309 Alzheimer's disease, unspecified: Secondary | ICD-10-CM | POA: Diagnosis not present

## 2023-10-09 DIAGNOSIS — N390 Urinary tract infection, site not specified: Secondary | ICD-10-CM | POA: Diagnosis not present

## 2023-10-09 DIAGNOSIS — D638 Anemia in other chronic diseases classified elsewhere: Secondary | ICD-10-CM | POA: Diagnosis not present

## 2023-10-09 DIAGNOSIS — I1310 Hypertensive heart and chronic kidney disease without heart failure, with stage 1 through stage 4 chronic kidney disease, or unspecified chronic kidney disease: Secondary | ICD-10-CM | POA: Diagnosis not present

## 2023-10-09 DIAGNOSIS — R296 Repeated falls: Secondary | ICD-10-CM | POA: Diagnosis not present

## 2023-10-09 DIAGNOSIS — G9341 Metabolic encephalopathy: Secondary | ICD-10-CM | POA: Diagnosis not present

## 2023-10-11 DIAGNOSIS — D638 Anemia in other chronic diseases classified elsewhere: Secondary | ICD-10-CM | POA: Diagnosis not present

## 2023-10-11 DIAGNOSIS — R296 Repeated falls: Secondary | ICD-10-CM | POA: Diagnosis not present

## 2023-10-11 DIAGNOSIS — N390 Urinary tract infection, site not specified: Secondary | ICD-10-CM | POA: Diagnosis not present

## 2023-10-11 DIAGNOSIS — G9341 Metabolic encephalopathy: Secondary | ICD-10-CM | POA: Diagnosis not present

## 2023-10-11 DIAGNOSIS — I1310 Hypertensive heart and chronic kidney disease without heart failure, with stage 1 through stage 4 chronic kidney disease, or unspecified chronic kidney disease: Secondary | ICD-10-CM | POA: Diagnosis not present

## 2023-10-14 ENCOUNTER — Telehealth: Payer: Self-pay

## 2023-10-14 NOTE — Transitions of Care (Post Inpatient/ED Visit) (Signed)
   10/14/2023  Name: Allison Chavez MRN: [id number] DOB: 1941/06/16  Today's TOC FU Call Status: Today's TOC FU Call Status:: Successful TOC FU Call Completed TOC FU Call Complete Date: 10/14/23 Patient's Name and Date of Birth confirmed.  Transition Care Management Follow-up Telephone Call Date of Discharge: 10/13/23 Discharge Facility: Other [facility]) Name of Other (Non[facility]) Discharge Facility: [facility] Type of Discharge: Inpatient Admission Primary Inpatient Discharge Diagnosis:: weakness How have you been since you were released from the hospital?: Better Any questions or concerns?: No  Items Reviewed: Did you receive and understand the discharge instructions provided?: Yes Medications obtained,verified, and reconciled?: Yes (Medications Reviewed) Any new allergies since your discharge?: No Dietary orders reviewed?: Yes Do you have support at home?: Yes People in Home: spouse  Medications Reviewed Today: Medications Reviewed Today     Reviewed by Karena Addison, LPN (Licensed Practical Nurse) on 10/14/23 at 1229  Med List Status: <None>   Medication Order Taking? Sig Documenting Provider Last Dose Status Informant  cephALEXin (KEFLEX) 500 MG capsule [id number]  Take 1 capsule (500 mg total) by mouth 2 (two) times daily. Durwin Glaze, MD  Active   thiamine 100 MG tablet [id number] No Take 1 tablet (100 mg total) by mouth daily. Albertine Grates, MD 09/19/2023 Active Care Giver, Pharmacy Records            Home Care and Equipment/Supplies: Any new equipment or medical supplies ordered?: NA  Functional Questionnaire: Do you need assistance with bathing/showering or dressing?: Yes Do you need assistance with meal preparation?: Yes Do you need assistance with eating?: No Do you have difficulty maintaining continence: Yes Do you need assistance with getting out of bed/getting out of a chair/moving?: Yes Do you have difficulty managing or taking your medications?:  Yes  Follow up appointments reviewed: PCP Follow-up appointment confirmed?: Yes Date of PCP follow-up appointment?: 10/16/23 Follow-up Provider: [facility] Follow-up appointment confirmed?: No Reason Specialist Follow-Up Not Confirmed: Patient has Specialist Provider Number and will Call for Appointment Do you need transportation to your follow-up appointment?: No Do you understand care options if your condition(s) worsen?: Yes-patient verbalized understanding    SIGNATURE Karena Addison, LPN [facility] Nurse Health Advisor Direct Dial [phone]

## 2023-10-15 DIAGNOSIS — I2510 Atherosclerotic heart disease of native coronary artery without angina pectoris: Secondary | ICD-10-CM | POA: Diagnosis not present

## 2023-10-15 DIAGNOSIS — M47812 Spondylosis without myelopathy or radiculopathy, cervical region: Secondary | ICD-10-CM | POA: Diagnosis not present

## 2023-10-15 DIAGNOSIS — R21 Rash and other nonspecific skin eruption: Secondary | ICD-10-CM | POA: Diagnosis not present

## 2023-10-15 DIAGNOSIS — K5900 Constipation, unspecified: Secondary | ICD-10-CM | POA: Diagnosis not present

## 2023-10-15 DIAGNOSIS — H409 Unspecified glaucoma: Secondary | ICD-10-CM | POA: Diagnosis not present

## 2023-10-15 DIAGNOSIS — E785 Hyperlipidemia, unspecified: Secondary | ICD-10-CM | POA: Diagnosis not present

## 2023-10-15 DIAGNOSIS — Z87891 Personal history of nicotine dependence: Secondary | ICD-10-CM | POA: Diagnosis not present

## 2023-10-15 DIAGNOSIS — M519 Unspecified thoracic, thoracolumbar and lumbosacral intervertebral disc disorder: Secondary | ICD-10-CM | POA: Diagnosis not present

## 2023-10-15 DIAGNOSIS — N390 Urinary tract infection, site not specified: Secondary | ICD-10-CM | POA: Diagnosis not present

## 2023-10-15 DIAGNOSIS — E876 Hypokalemia: Secondary | ICD-10-CM | POA: Diagnosis not present

## 2023-10-15 DIAGNOSIS — G4730 Sleep apnea, unspecified: Secondary | ICD-10-CM | POA: Diagnosis not present

## 2023-10-15 DIAGNOSIS — M170 Bilateral primary osteoarthritis of knee: Secondary | ICD-10-CM | POA: Diagnosis not present

## 2023-10-15 DIAGNOSIS — D631 Anemia in chronic kidney disease: Secondary | ICD-10-CM | POA: Diagnosis not present

## 2023-10-15 DIAGNOSIS — E871 Hypo-osmolality and hyponatremia: Secondary | ICD-10-CM | POA: Diagnosis not present

## 2023-10-15 DIAGNOSIS — M1990 Unspecified osteoarthritis, unspecified site: Secondary | ICD-10-CM | POA: Diagnosis not present

## 2023-10-15 DIAGNOSIS — G4700 Insomnia, unspecified: Secondary | ICD-10-CM | POA: Diagnosis not present

## 2023-10-15 DIAGNOSIS — Z556 Problems related to health literacy: Secondary | ICD-10-CM | POA: Diagnosis not present

## 2023-10-15 DIAGNOSIS — H269 Unspecified cataract: Secondary | ICD-10-CM | POA: Diagnosis not present

## 2023-10-15 DIAGNOSIS — G309 Alzheimer's disease, unspecified: Secondary | ICD-10-CM | POA: Diagnosis not present

## 2023-10-15 DIAGNOSIS — N1832 Chronic kidney disease, stage 3b: Secondary | ICD-10-CM | POA: Diagnosis not present

## 2023-10-15 DIAGNOSIS — R2689 Other abnormalities of gait and mobility: Secondary | ICD-10-CM | POA: Diagnosis not present

## 2023-10-15 DIAGNOSIS — S2232XD Fracture of one rib, left side, subsequent encounter for fracture with routine healing: Secondary | ICD-10-CM | POA: Diagnosis not present

## 2023-10-15 DIAGNOSIS — I1310 Hypertensive heart and chronic kidney disease without heart failure, with stage 1 through stage 4 chronic kidney disease, or unspecified chronic kidney disease: Secondary | ICD-10-CM | POA: Diagnosis not present

## 2023-10-15 DIAGNOSIS — N183 Chronic kidney disease, stage 3 unspecified: Secondary | ICD-10-CM | POA: Diagnosis not present

## 2023-10-15 DIAGNOSIS — R278 Other lack of coordination: Secondary | ICD-10-CM | POA: Diagnosis not present

## 2023-10-15 DIAGNOSIS — G9341 Metabolic encephalopathy: Secondary | ICD-10-CM | POA: Diagnosis not present

## 2023-10-15 DIAGNOSIS — M19011 Primary osteoarthritis, right shoulder: Secondary | ICD-10-CM | POA: Diagnosis not present

## 2023-10-15 DIAGNOSIS — H353132 Nonexudative age-related macular degeneration, bilateral, intermediate dry stage: Secondary | ICD-10-CM | POA: Diagnosis not present

## 2023-10-15 DIAGNOSIS — R3915 Urgency of urination: Secondary | ICD-10-CM | POA: Diagnosis not present

## 2023-10-15 DIAGNOSIS — M8580 Other specified disorders of bone density and structure, unspecified site: Secondary | ICD-10-CM | POA: Diagnosis not present

## 2023-10-15 DIAGNOSIS — K5790 Diverticulosis of intestine, part unspecified, without perforation or abscess without bleeding: Secondary | ICD-10-CM | POA: Diagnosis not present

## 2023-10-15 DIAGNOSIS — I129 Hypertensive chronic kidney disease with stage 1 through stage 4 chronic kidney disease, or unspecified chronic kidney disease: Secondary | ICD-10-CM | POA: Diagnosis not present

## 2023-10-16 ENCOUNTER — Ambulatory Visit (INDEPENDENT_AMBULATORY_CARE_PROVIDER_SITE_OTHER): Admitting: Family Medicine

## 2023-10-16 ENCOUNTER — Ambulatory Visit: Payer: Self-pay | Admitting: Family Medicine

## 2023-10-16 ENCOUNTER — Telehealth: Payer: Self-pay | Admitting: Family Medicine

## 2023-10-16 ENCOUNTER — Encounter: Payer: Self-pay | Admitting: Family Medicine

## 2023-10-16 VITALS — BP 112/64 | HR 86 | Temp 98.1°F | Ht 61.0 in

## 2023-10-16 DIAGNOSIS — G309 Alzheimer's disease, unspecified: Secondary | ICD-10-CM

## 2023-10-16 DIAGNOSIS — R319 Hematuria, unspecified: Secondary | ICD-10-CM

## 2023-10-16 DIAGNOSIS — D649 Anemia, unspecified: Secondary | ICD-10-CM

## 2023-10-16 DIAGNOSIS — N390 Urinary tract infection, site not specified: Secondary | ICD-10-CM | POA: Diagnosis not present

## 2023-10-16 DIAGNOSIS — G9341 Metabolic encephalopathy: Secondary | ICD-10-CM | POA: Diagnosis not present

## 2023-10-16 DIAGNOSIS — R946 Abnormal results of thyroid function studies: Secondary | ICD-10-CM | POA: Diagnosis not present

## 2023-10-16 DIAGNOSIS — F0280 Dementia in other diseases classified elsewhere without behavioral disturbance: Secondary | ICD-10-CM

## 2023-10-16 DIAGNOSIS — R7989 Other specified abnormal findings of blood chemistry: Secondary | ICD-10-CM

## 2023-10-16 DIAGNOSIS — M79642 Pain in left hand: Secondary | ICD-10-CM

## 2023-10-16 DIAGNOSIS — G4700 Insomnia, unspecified: Secondary | ICD-10-CM | POA: Diagnosis not present

## 2023-10-16 LAB — COMPREHENSIVE METABOLIC PANEL
ALT: 10 U/L (ref 0–35)
AST: 14 U/L (ref 0–37)
Albumin: 4.2 g/dL (ref 3.5–5.2)
Alkaline Phosphatase: 90 U/L (ref 39–117)
BUN: 21 mg/dL (ref 6–23)
CO2: 26 meq/L (ref 19–32)
Calcium: 9.6 mg/dL (ref 8.4–10.5)
Chloride: 101 meq/L (ref 96–112)
Creatinine, Ser: 1.53 mg/dL — ABNORMAL HIGH (ref 0.40–1.20)
GFR: 31.85 mL/min — ABNORMAL LOW (ref >60.00)
Glucose, Bld: 111 mg/dL — ABNORMAL HIGH (ref 70–99)
Potassium: 4.3 meq/L (ref 3.5–5.1)
Sodium: 136 meq/L (ref 135–145)
Total Bilirubin: 0.4 mg/dL (ref 0.2–1.2)
Total Protein: 7.4 g/dL (ref 6.0–8.3)

## 2023-10-16 LAB — TSH: TSH: 8.09 u[IU]/mL — ABNORMAL HIGH (ref 0.35–5.50)

## 2023-10-16 LAB — CBC
HCT: 33.1 % — ABNORMAL LOW (ref 36.0–46.0)
Hemoglobin: 10.7 g/dL — ABNORMAL LOW (ref 12.0–15.0)
MCHC: 32.4 g/dL (ref 30.0–36.0)
MCV: 95.2 fl (ref 78.0–100.0)
Platelets: 232 10*3/uL (ref 150.0–400.0)
RBC: 3.48 Mil/uL — ABNORMAL LOW (ref 3.87–5.11)
RDW: 21.5 % — ABNORMAL HIGH (ref 11.5–15.5)
WBC: 10.5 10*3/uL (ref 4.0–10.5)

## 2023-10-16 MED ORDER — QUETIAPINE FUMARATE 25 MG PO TABS
25.0000 mg | ORAL_TABLET | Freq: Every evening | ORAL | 0 refills | Status: AC | PRN
Start: 2023-10-16 — End: ?

## 2023-10-16 NOTE — Telephone Encounter (Signed)
 Placed in folder at nurse station

## 2023-10-16 NOTE — Telephone Encounter (Signed)
 Home Health Certification or Plan of Care Tracking  Is this a Certification or Plan of Care? Plan of Care  [facility] Agency: [facility]  Order Number:  [id number]  Has charge sheet been attached? Yes  Where has form been placed:   Labeled & placed in provider bin

## 2023-10-16 NOTE — Patient Instructions (Addendum)
 I will check labs today, including urine test to decide if antibiotics needed. If any new confusion, agitation, or change in symptoms be seen.   Try to bring urine specimen later today if possible. If not, then tomorrow is ok as well. I will have the orders in the system.   Try to minimize naps during the day, sleepy time tea at night and other information below on insomnia may be helpful.  If you are still having difficulty with getting to sleep at night, can try low-dose Seroquel temporarily.  Let's follow-up in 1 month to discuss this further.   No other med changes at this time.   Insomnia Insomnia is a sleep disorder that makes it difficult to fall asleep or stay asleep. Insomnia can cause fatigue, low energy, difficulty concentrating, mood swings, and poor performance at work or school. There are three different ways to classify insomnia: Difficulty falling asleep. Difficulty staying asleep. Waking up too early in the morning. Any type of insomnia can be long-term (chronic) or short-term (acute). Both are common. Short-term insomnia usually lasts for 3 months or less. Chronic insomnia occurs at least three times a week for longer than 3 months. What are the causes? Insomnia may be caused by another condition, situation, or substance, such as: Having certain mental health conditions, such as anxiety and depression. Using caffeine, alcohol, tobacco, or drugs. Having gastrointestinal conditions, such as gastroesophageal reflux disease (GERD). Having certain medical conditions. These include: Asthma. Alzheimer's disease. Stroke. Chronic pain. An overactive thyroid gland (hyperthyroidism). Other sleep disorders, such as restless legs syndrome and sleep apnea. Menopause. Sometimes, the cause of insomnia may not be known. What increases the risk? Risk factors for insomnia include: Gender. Females are affected more often than males. Age. Insomnia is more common as people get  older. Stress and certain medical and mental health conditions. Lack of exercise. Having an irregular work schedule. This may include working night shifts and traveling between different time zones. What are the signs or symptoms? If you have insomnia, the main symptom is having trouble falling asleep or having trouble staying asleep. This may lead to other symptoms, such as: Feeling tired or having low energy. Feeling nervous about going to sleep. Not feeling rested in the morning. Having trouble concentrating. Feeling irritable, anxious, or depressed. How is this diagnosed? This condition may be diagnosed based on: Your symptoms and medical history. Your health care provider may ask about: Your sleep habits. Any medical conditions you have. Your mental health. A physical exam. How is this treated? Treatment for insomnia depends on the cause. Treatment may focus on treating an underlying condition that is causing the insomnia. Treatment may also include: Medicines to help you sleep. Counseling or therapy. Lifestyle adjustments to help you sleep better. Follow these instructions at home: Eating and drinking  Limit or avoid alcohol, caffeinated beverages, and products that contain nicotine and tobacco, especially close to bedtime. These can disrupt your sleep. Do not eat a large meal or eat spicy foods right before bedtime. This can lead to digestive discomfort that can make it hard for you to sleep. Sleep habits  Keep a sleep diary to help you and your health care provider figure out what could be causing your insomnia. Write down: When you sleep. When you wake up during the night. How well you sleep and how rested you feel the next day. Any side effects of medicines you are taking. What you eat and drink. Make your bedroom a dark, comfortable  place where it is easy to fall asleep. Put up shades or blackout curtains to block light from outside. Use a white noise machine to block  noise. Keep the temperature cool. Limit screen use before bedtime. This includes: Not watching TV. Not using your smartphone, tablet, or computer. Stick to a routine that includes going to bed and waking up at the same times every day and night. This can help you fall asleep faster. Consider making a quiet activity, such as reading, part of your nighttime routine. Try to avoid taking naps during the day so that you sleep better at night. Get out of bed if you are still awake after 15 minutes of trying to sleep. Keep the lights down, but try reading or doing a quiet activity. When you feel sleepy, go back to bed. General instructions Take over-the-counter and prescription medicines only as told by your health care provider. Exercise regularly as told by your health care provider. However, avoid exercising in the hours right before bedtime. Use relaxation techniques to manage stress. Ask your health care provider to suggest some techniques that may work well for you. These may include: Breathing exercises. Routines to release muscle tension. Visualizing peaceful scenes. Make sure that you drive carefully. Do not drive if you feel very sleepy. Keep all follow-up visits. This is important. Contact a health care provider if: You are tired throughout the day. You have trouble in your daily routine due to sleepiness. You continue to have sleep problems, or your sleep problems get worse. Get help right away if: You have thoughts about hurting yourself or someone else. Get help right away if you feel like you may hurt yourself or others, or have thoughts about taking your own life. Go to your nearest emergency room or: Call 911. Call the National Suicide Prevention Lifeline at (860)426-5250 or 988. This is open 24 hours a day. Text the Crisis Text Line at (778)684-3008. Summary Insomnia is a sleep disorder that makes it difficult to fall asleep or stay asleep. Insomnia can be long-term (chronic) or  short-term (acute). Treatment for insomnia depends on the cause. Treatment may focus on treating an underlying condition that is causing the insomnia. Keep a sleep diary to help you and your health care provider figure out what could be causing your insomnia. This information is not intended to replace advice given to you by your health care provider. Make sure you discuss any questions you have with your health care provider. Document Revised: 06/26/2021 Document Reviewed: 06/26/2021 Elsevier Patient Education  2024 [employer].

## 2023-10-16 NOTE — Telephone Encounter (Signed)
 Chief Complaint: Redness on vulva Symptoms: Uncomfortable, swelling Pertinent Negatives: Patient denies fever, itching Disposition: [x]  Follow-up with PCP Additional Notes: Spoke with pt's husband, Allison Chavez, and caregiver, Allison Chavez. Pt was seen in office today by Dr. Neva Seat for a UTI but pt did not think to mention the vulva redness.  Pt caregiver states the pt was vomiting last night. She thinks the Keflex for her UTI may have upset her stomach. She states there is some redness around her vulva. She is requesting something to help with the redness on her vulva. This RN educated pt caregiver on home care, new-worsening symptoms, when to call back/seek emergent care. Pt caregiver verbalized understanding and agrees to plan.   Call back caregiver Allison Chavez [phone]    Reason for Disposition  [1] Symptoms of a yeast infection (i.e., itchy, white discharge, not bad smelling) AND [2] feels like prior vaginal yeast infections  Answer Assessment - Initial Assessment Questions 1. SYMPTOM: "What's the main symptom you're concerned about?" (e.g., pain, itching, dryness)     Redness on vulva 2. ONSET: "When did the  redness  start?"     Noticed last night  Protocols used: Vaginal Symptoms-A-AH

## 2023-10-16 NOTE — Telephone Encounter (Signed)
 Patient called, left VM to return the call to the office to speak to NT.   Copied from CRM [id number]. Topic: Clinical - Medical Advice >> Oct 16, 2023  5:01 PM Tiffany S wrote: Reason for CRM: Patient has a swollen red vulva and its painful patient is requesting call back [phone] Lanora Manis

## 2023-10-16 NOTE — Progress Notes (Addendum)
 Subjective:  Patient ID: Allison Chavez, female    DOB: [birth year]  Age: 83 y.o. MRN: [id number]  CC:  Chief Complaint  Patient presents with   Hospitalization Follow-up    Pt was at [facility] long 5 days and then she was moved to [facility] stayed for 20 days with no meds.  Started Keflex yesterday from pharmacy and started that last night but Keflex caused vomiting     HPI Allison Chavez presents for  Follow-up, including hospital follow-up, ED follow-up.  Last visit with me in January.  Home health therapy with physical therapy, Occupational Therapy with history of falls, denied other needs at that time.  Referred to Ortho with her decreased hand range of motion and shoulder pain, old fracture and deformity of proximal left humeral head.  Dementia discussed, had been referred back to neurology as previously had been on Celexa, Namenda then lost to follow-up in May 2023.  Had not yet followed up with neurology or called for appointment at her January 9 visit.  Family friend Allison Chavez has been very helpful in helping to coordinate her care.  We decided against any new medicines until she was seen by neurology.  We also provided number at her January visit for orthopedic follow-up.  Acute metabolic encephalopathy with UTI Hospital notes reviewed, admitted February 20 through February 25 and discharged to skilled nursing facility. Admitted with acute metabolic encephalopathy likely secondary to UTI.  Urine culture with E. coli and Enterococcus, given 1 dose of fosfomycin and completed 3 days of Rocephin. Discharged on thiamine daily.   Dementia As above, I last met with her in January, plan for neuro follow-up.  She was seen by Marlowe Kays on 08/15/2023.  Prior CT findings consistent with Alzheimer's disease.  Abnormal TSH, anemia noted and plan for PCP follow-up.  Elevated blood pressure also noted that visit, close follow-up with me recommended, as well as B1 level low in the past, recheck recommended.   Continued on Ortho, PT, OT treatment.  Memory care was discussed for social and cognitive stimulation.  It was decided by companion at that time that she would be happier home with home health nurse.  Given her cognitive decline, resuming memantine was not thought to be of therapeutic benefit and with the risks including dizziness outweighed the benefit, meds were not restarted.  Plan for mood control follow-up with me. Admitted in February for acute metabolic encephalopathy as above with UTI as suspected source.  She was seen again March 9 in the ER for altered mental status and agitation.  She was at [facility] rehab at that time, was noted to be hostile, combative which was not her normal state.  In the ER, urinalysis indicated possible infection and was given a dose of Rocephin, discharged on Keflex. Urinalysis reviewed from 10/06/2023, large LE with 6-10 RBCs, greater than 50 WBCs.  No urine culture. She is still on vitamin B1 daily.  Here with Allison Chavez today -  She is at home since 3 days ago.  Rx of keflex form ER - not aware of that med being sent to pharmacy. Was picked up yesterday, 1st dose last night - vomited once - soon after pill.  No dysuria. No subjective or objective fever. No abdominal pain. Per Phil, no recent change in mood, agitation or other new symptoms.   She feels like mood is ok - denies agitation, depression, anxiety at this time and dose not want to restart SSRI at this time.  Spouse as relayed question of something to sleep at night. She takes naps during the day, then up at night playing piano. She notes difficulty with getting to sleep at night at times. In home care provider living downstairs.  Denies a/v/t hallucinations.  Anemia Thought to be anemia of chronic disease with chronic kidney disease when she was hospitalized in February. Lab Results  Component Value Date   WBC 6.6 10/06/2023   HGB 9.4 (L) 10/06/2023   HCT 29.9 (L) 10/06/2023   MCV 97.7 10/06/2023   PLT  227 10/06/2023   Abnormal TSH As above, repeat testing planned, no current meds.  Borderline elevation in the past as well, ranging from 5.67 to most recent reading of 8.53 in January. Lab Results  Component Value Date   TSH 8.53 (H) 08/08/2023    Hand contracture She was seen by Dr. Frazier Butt on 08/27/2023, diagnosed with contracture of left hand, upper limb spasticity and Dupuytren's disease of palm.Continued on home hand therapy and referred to [facility]. Not aware of this referral yet.  Still has home heath coming out to house, and home PT. Has new device called a carrot - to help with stretching out the hand.       History Patient Active Problem List   Diagnosis Date Noted   FTT (failure to thrive) in adult 09/19/2023   Anemia in chronic kidney disease (CKD) 09/19/2023   Acute metabolic encephalopathy 09/19/2023   Generalized anxiety disorder 09/18/2022   Major depressive disorder 09/18/2022   DJD (degenerative joint disease) of cervical spine 09/18/2022   Dementia due to Alzheimer's disease 12/19/2021   Senile entropion of right lower eyelid 06/07/2020   Intermediate stage nonexudative age-related macular degeneration of both eyes 12/08/2019   Vitreous prolapse of left eye 12/08/2019   Degenerative retinal drusen of left eye 12/08/2019   Degenerative retinal drusen of right eye 12/08/2019   Pseudophakia of both eyes 12/08/2019   CKD (chronic kidney disease), stage III 12/25/2017   Hyponatremia 12/25/2017   Hypokalemia 12/25/2017   Alcohol abuse 12/25/2017   Sigmoid diverticulosis 01/15/2017   Osteopenia 01/11/2015   Hyperlipidemia 01/11/2015   Insomnia 10/08/2013   HTN (hypertension) 03/07/2012   Past Medical History:  Diagnosis Date   Alcohol abuse 12/25/2017   Cataract    CKD (chronic kidney disease), stage III 12/25/2017   Degenerative retinal drusen of left eye 12/08/2019   Degenerative retinal drusen of right eye 12/08/2019   Dementia due  to Alzheimer's disease 12/19/2021   DJD (degenerative joint disease) of cervical spine    DJD of shoulder, right    Generalized anxiety disorder    HTN (hypertension) 03/07/2012   Hyperlipidemia 01/11/2015   Hypokalemia 12/25/2017   Hyponatremia 12/25/2017   Insomnia 10/08/2013   Intermediate stage nonexudative age-related macular degeneration of both eyes 12/08/2019   Major depressive disorder    Osteopenia 01/11/2015   Updated DEXA 01/17/2015. Osteopenia. Stable since 11/24/2002.   Pneumonia    as a child   Pseudophakia of both eyes 12/08/2019   Renal insufficiency, mild    Senile entropion of right lower eyelid 06/07/2020   Sigmoid diverticulosis 01/08/2017   Noted on colonoscopy   Vitreous prolapse of left eye 12/08/2019   Past Surgical History:  Procedure Laterality Date   CLAVICLE SURGERY Right    from [facility]   COLONOSCOPY     EYE SURGERY Bilateral    cataract surgery with lens implants   HARDWARE REMOVAL Left 12/09/2014   Procedure:  LEFT SHOULDER HARDWARE REMOVAL;  Surgeon: Francena Hanly, MD;  Location: [facility] OR;  Service: Orthopedics;  Laterality: Left;   ORIF SHOULDER FRACTURE Left 09/23/2014   Procedure: OPEN REDUCTION INTERNAL FIXATION (ORIF) SHOULDER FRACTURE;  Surgeon: Senaida Lange, MD;  Location: [facility] OR;  Service: Orthopedics;  Laterality: Left;   TONSILLECTOMY     adenoids- childhood   Allergies  Allergen Reactions   Ace Inhibitors Cough    Cough    Sulfa Antibiotics Hives   Wellbutrin [Bupropion] Rash    Only with generic, immediate release   Prior to Admission medications   Medication Sig Start Date End Date Taking? Authorizing Provider  cephALEXin (KEFLEX) 500 MG capsule Take 1 capsule (500 mg total) by mouth 2 (two) times daily. 10/07/23   Durwin Glaze, MD  thiamine 100 MG tablet Take 1 tablet (100 mg total) by mouth daily. 12/28/17   Albertine Grates, MD   Social History   Socioeconomic History   Marital status: Married    Spouse name: Fredrik Cove   Number of  children: 3   Years of education: [employer] education level: Bachelor's degree (e.g., BA, AB, BS)  Occupational History   Occupation: Engineer, agricultural  Tobacco Use   Smoking status: Former    Current packs/day: 0.00    Average packs/day: 0.3 packs/day for 45.0 years (13.5 ttl pk-yrs)    Types: Cigarettes    Start date: 11/29/1956    Quit date: 11/29/2001    Years since quitting: 21.8   Smokeless tobacco: Never   Tobacco comments:    quit age 59  Vaping Use   Vaping status: Never Used  Substance and Sexual Activity   Alcohol use: Yes    Comment: special occasions   Drug use: No   Sexual activity: Never    Partners: Male    Birth control/protection: Post-menopausal  Other Topics Concern   Not on file  Social History Narrative   Has not lived with husband (3rd marriage), Fredrik Cove, for many years, though they maintain an intermittently sexual relationship. They talk almost daily.  He lives with his female partner.    Exercise- walks every day, for about a mile on average   Diet- Makes healthy choices and drinks a lot of water.    Sleep- 8 hours a night   Caffeine- coffee, 1/2 cup daily   Social Drivers of Health   Financial Resource Strain: Low Risk  (12/13/2022)   Overall Financial Resource Strain (CARDIA)    Difficulty of Paying Living Expenses: Not hard at all  Food Insecurity: No Food Insecurity (09/20/2023)   Hunger Vital Sign    Worried About Running Out of Food in the Last Year: Never true    Ran Out of Food in the Last Year: Never true  Transportation Needs: No Transportation Needs (09/20/2023)   PRAPARE - Administrator, Civil Service (Medical): No    Lack of Transportation (Non-Medical): No  Physical Activity: Insufficiently Active (12/13/2022)   Exercise Vital Sign    Days of Exercise per Week: 4 days    Minutes of Exercise per Session: 30 min  Stress: No Stress Concern Present (12/13/2022)   [employer] of Occupational Health - Occupational Stress  Questionnaire    Feeling of Stress : Not at all  Social Connections: Socially Integrated (09/22/2023)   Social Connection and Isolation Panel [NHANES]    Frequency of Communication with Friends and Family: Twice a week    Frequency of Social Gatherings  with Friends and Family: Three times a week    Attends Religious Services: More than 4 times per year    Active Member of Clubs or Organizations: Yes    Attends Banker Meetings: 1 to 4 times per year    Marital Status: Married  Catering manager Violence: Not At Risk (09/20/2023)   Humiliation, Afraid, Rape, and Kick questionnaire    Fear of Current or Ex-Partner: No    Emotionally Abused: No    Physically Abused: No    Sexually Abused: No    Review of Systems Per HPI.   Objective:   Vitals:   10/16/23 1105  BP: 112/64  Pulse: 86  Temp: 98.1 F (36.7 C)  TempSrc: Temporal  SpO2: 96%  Height: 5\' 1"  (1.549 m)     Physical Exam Vitals reviewed.  Constitutional:      General: She is not in acute distress.    Appearance: Normal appearance. She is well-developed. She is not ill-appearing, toxic-appearing or diaphoretic.  HENT:     Head: Normocephalic and atraumatic.  Eyes:     Conjunctiva/sclera: Conjunctivae normal.     Pupils: Pupils are equal, round, and reactive to light.  Neck:     Vascular: No carotid bruit.  Cardiovascular:     Rate and Rhythm: Normal rate and regular rhythm.     Heart sounds: Normal heart sounds.  Pulmonary:     Effort: Pulmonary effort is normal.     Breath sounds: Normal breath sounds.  Abdominal:     General: There is no distension.     Palpations: Abdomen is soft. There is no pulsatile mass.     Tenderness: There is no abdominal tenderness. There is no right CVA tenderness, left CVA tenderness or guarding.  Musculoskeletal:     Right lower leg: No edema.     Left lower leg: No edema.  Skin:    General: Skin is warm and dry.  Neurological:     Mental Status: She is alert  and oriented to person, place, and time.  Psychiatric:        Mood and Affect: Mood normal.        Behavior: Behavior normal.     Comments: Pleasant, appropriate responses to questioning, with intermittent questions off topic of conversation.  Does not appear to be responding to internal stimuli and denies ABT hallucinations, delusions, depression or anxiety symptoms at this time.        Assessment & Plan:  Allison Chavez is a 82 y.o. female . Urinary tract infection with hematuria, site unspecified - Plan: POCT urinalysis dipstick, Urine Culture, CANCELED: POCT urinalysis dipstick, CANCELED: Urine Culture Acute metabolic encephalopathy  -Prior hospitalization for suspected acute metabolic encephalopathy with urinary tract infection.  IV antibiotics received at that time.  Possible recurrence of UTI during ER visit 10 days ago, Rocephin injection given that time and prescription for Keflex, but some confusion regarding that medication and just filled recently, 1 dose last night with emesis afterward.  Given no current symptoms we will try to check urinalysis to decide if treatment needed at this time.  Unable to provide urine in office, advised to collect urine at home and bring to office for testing.  Orders pended.  Hold on meds for now until further data on labs with RTC/ER precautions.  Dementia due to Alzheimer's disease. Insomnia, unspecified type - Plan: QUEtiapine (SEROQUEL) 25 MG tablet  -Denies any recent changes in memory symptoms other than episodes above requiring  ER evaluation.  Denies mood symptoms at this time but some insomnia.  Suspect some of this may be due from daytime napping, sleep hygiene reviewed, option of sleepy time tea, option of low-dose Seroquel if needed at bedtime.  This may be helpful if any recurrence of agitation but those symptoms thought to be due to infection at that time.  Recheck in 1 month.  Elevated TSH - Plan: TSH  -Check labs, then consider low-dose  replacement.  Normocytic anemia - Plan: CBC  -Thought to be due to CKD/anemia of chronic disease.  Check CBC, CMP.  Recent labs from 3/9 noted.  Left hand pain  -With contracture, status post hand surgery evaluation as above.  Continue PT, home treatment.  Option of meeting with [facility] spasticity clinic.   67 minutes spent during visit, including chart review, discussion of multiple ER and hospital visits, additional concerns including insomnia, counseling and assimilation of information, exam, discussion of plan, and chart completion.    Meds ordered this encounter  Medications   QUEtiapine (SEROQUEL) 25 MG tablet    Sig: Take 1 tablet (25 mg total) by mouth at bedtime as needed.    Dispense:  30 tablet    Refill:  0   Patient Instructions  I will check labs today, including urine test to decide if antibiotics needed. If any new confusion, agitation, or change in symptoms be seen.   Try to bring urine specimen later today if possible. If not, then tomorrow is ok as well. I will have the orders in the system.   Try to minimize naps during the day, sleepy time tea at night and other information below on insomnia may be helpful.  If you are still having difficulty with getting to sleep at night, can try low-dose Seroquel temporarily.  Let's follow-up in 1 month to discuss this further.   No other med changes at this time.   Insomnia Insomnia is a sleep disorder that makes it difficult to fall asleep or stay asleep. Insomnia can cause fatigue, low energy, difficulty concentrating, mood swings, and poor performance at work or school. There are three different ways to classify insomnia: Difficulty falling asleep. Difficulty staying asleep. Waking up too early in the morning. Any type of insomnia can be long-term (chronic) or short-term (acute). Both are common. Short-term insomnia usually lasts for 3 months or less. Chronic insomnia occurs at least three times a week for longer than  3 months. What are the causes? Insomnia may be caused by another condition, situation, or substance, such as: Having certain mental health conditions, such as anxiety and depression. Using caffeine, alcohol, tobacco, or drugs. Having gastrointestinal conditions, such as gastroesophageal reflux disease (GERD). Having certain medical conditions. These include: Asthma. Alzheimer's disease. Stroke. Chronic pain. An overactive thyroid gland (hyperthyroidism). Other sleep disorders, such as restless legs syndrome and sleep apnea. Menopause. Sometimes, the cause of insomnia may not be known. What increases the risk? Risk factors for insomnia include: Gender. Females are affected more often than males. Age. Insomnia is more common as people get older. Stress and certain medical and mental health conditions. Lack of exercise. Having an irregular work schedule. This may include working night shifts and traveling between different time zones. What are the signs or symptoms? If you have insomnia, the main symptom is having trouble falling asleep or having trouble staying asleep. This may lead to other symptoms, such as: Feeling tired or having low energy. Feeling nervous about going to sleep. Not feeling  rested in the morning. Having trouble concentrating. Feeling irritable, anxious, or depressed. How is this diagnosed? This condition may be diagnosed based on: Your symptoms and medical history. Your health care provider may ask about: Your sleep habits. Any medical conditions you have. Your mental health. A physical exam. How is this treated? Treatment for insomnia depends on the cause. Treatment may focus on treating an underlying condition that is causing the insomnia. Treatment may also include: Medicines to help you sleep. Counseling or therapy. Lifestyle adjustments to help you sleep better. Follow these instructions at home: Eating and drinking  Limit or avoid alcohol,  caffeinated beverages, and products that contain nicotine and tobacco, especially close to bedtime. These can disrupt your sleep. Do not eat a large meal or eat spicy foods right before bedtime. This can lead to digestive discomfort that can make it hard for you to sleep. Sleep habits  Keep a sleep diary to help you and your health care provider figure out what could be causing your insomnia. Write down: When you sleep. When you wake up during the night. How well you sleep and how rested you feel the next day. Any side effects of medicines you are taking. What you eat and drink. Make your bedroom a dark, comfortable place where it is easy to fall asleep. Put up shades or blackout curtains to block light from outside. Use a white noise machine to block noise. Keep the temperature cool. Limit screen use before bedtime. This includes: Not watching TV. Not using your smartphone, tablet, or computer. Stick to a routine that includes going to bed and waking up at the same times every day and night. This can help you fall asleep faster. Consider making a quiet activity, such as reading, part of your nighttime routine. Try to avoid taking naps during the day so that you sleep better at night. Get out of bed if you are still awake after 15 minutes of trying to sleep. Keep the lights down, but try reading or doing a quiet activity. When you feel sleepy, go back to bed. General instructions Take over-the-counter and prescription medicines only as told by your health care provider. Exercise regularly as told by your health care provider. However, avoid exercising in the hours right before bedtime. Use relaxation techniques to manage stress. Ask your health care provider to suggest some techniques that may work well for you. These may include: Breathing exercises. Routines to release muscle tension. Visualizing peaceful scenes. Make sure that you drive carefully. Do not drive if you feel very  sleepy. Keep all follow-up visits. This is important. Contact a health care provider if: You are tired throughout the day. You have trouble in your daily routine due to sleepiness. You continue to have sleep problems, or your sleep problems get worse. Get help right away if: You have thoughts about hurting yourself or someone else. Get help right away if you feel like you may hurt yourself or others, or have thoughts about taking your own life. Go to your nearest emergency room or: Call 911. Call the National Suicide Prevention Lifeline at 619-582-1792 or 988. This is open 24 hours a day. Text the Crisis Text Line at 5644856675. Summary Insomnia is a sleep disorder that makes it difficult to fall asleep or stay asleep. Insomnia can be long-term (chronic) or short-term (acute). Treatment for insomnia depends on the cause. Treatment may focus on treating an underlying condition that is causing the insomnia. Keep a sleep diary to help you  and your health care provider figure out what could be causing your insomnia. This information is not intended to replace advice given to you by your health care provider. Make sure you discuss any questions you have with your health care provider. Document Revised: 06/26/2021 Document Reviewed: 06/26/2021 Elsevier Patient Education  2024 Elsevier Inc.      Signed,   Meredith Staggers, MD [facility], [facility] 10/16/23 12:20 PM

## 2023-10-17 DIAGNOSIS — M47812 Spondylosis without myelopathy or radiculopathy, cervical region: Secondary | ICD-10-CM | POA: Diagnosis not present

## 2023-10-17 DIAGNOSIS — M8580 Other specified disorders of bone density and structure, unspecified site: Secondary | ICD-10-CM | POA: Diagnosis not present

## 2023-10-17 DIAGNOSIS — K5790 Diverticulosis of intestine, part unspecified, without perforation or abscess without bleeding: Secondary | ICD-10-CM | POA: Diagnosis not present

## 2023-10-17 DIAGNOSIS — H409 Unspecified glaucoma: Secondary | ICD-10-CM | POA: Diagnosis not present

## 2023-10-17 DIAGNOSIS — H353132 Nonexudative age-related macular degeneration, bilateral, intermediate dry stage: Secondary | ICD-10-CM | POA: Diagnosis not present

## 2023-10-17 DIAGNOSIS — M170 Bilateral primary osteoarthritis of knee: Secondary | ICD-10-CM | POA: Diagnosis not present

## 2023-10-17 DIAGNOSIS — G309 Alzheimer's disease, unspecified: Secondary | ICD-10-CM | POA: Diagnosis not present

## 2023-10-17 DIAGNOSIS — E871 Hypo-osmolality and hyponatremia: Secondary | ICD-10-CM | POA: Diagnosis not present

## 2023-10-17 DIAGNOSIS — K5900 Constipation, unspecified: Secondary | ICD-10-CM | POA: Diagnosis not present

## 2023-10-17 DIAGNOSIS — I129 Hypertensive chronic kidney disease with stage 1 through stage 4 chronic kidney disease, or unspecified chronic kidney disease: Secondary | ICD-10-CM | POA: Diagnosis not present

## 2023-10-17 DIAGNOSIS — M19011 Primary osteoarthritis, right shoulder: Secondary | ICD-10-CM | POA: Diagnosis not present

## 2023-10-17 DIAGNOSIS — R21 Rash and other nonspecific skin eruption: Secondary | ICD-10-CM | POA: Diagnosis not present

## 2023-10-17 DIAGNOSIS — I2510 Atherosclerotic heart disease of native coronary artery without angina pectoris: Secondary | ICD-10-CM | POA: Diagnosis not present

## 2023-10-17 DIAGNOSIS — N183 Chronic kidney disease, stage 3 unspecified: Secondary | ICD-10-CM | POA: Diagnosis not present

## 2023-10-17 DIAGNOSIS — H269 Unspecified cataract: Secondary | ICD-10-CM | POA: Diagnosis not present

## 2023-10-17 DIAGNOSIS — G4730 Sleep apnea, unspecified: Secondary | ICD-10-CM | POA: Diagnosis not present

## 2023-10-17 DIAGNOSIS — S2232XD Fracture of one rib, left side, subsequent encounter for fracture with routine healing: Secondary | ICD-10-CM | POA: Diagnosis not present

## 2023-10-17 DIAGNOSIS — E876 Hypokalemia: Secondary | ICD-10-CM | POA: Diagnosis not present

## 2023-10-17 DIAGNOSIS — R3915 Urgency of urination: Secondary | ICD-10-CM | POA: Diagnosis not present

## 2023-10-17 NOTE — Telephone Encounter (Signed)
 Noted. Called [facility]. Has used zinc oxide barrier cream. Some fecal matter was noted at vaginal area - had to be cleaned by CNA. They do have topical antifungal cream to use as well. Unable to collect U/A yesterday.   Plan for trying to collect U/A if possible tomorrow, continue barrier cream with ER/urgent care/RTC precautions discussed.   Allison Chavez did note that she may not be there in few weeks to assist -will be moving.  Home health has been involved, including PT, OT, skilled nursing, but please check to see that social work is also involved to check into potential needs,  future needs, living situation options if current assistance at home will be changing soon.  Thanks.

## 2023-10-17 NOTE — Telephone Encounter (Signed)
 Upset stomach with Keflex also mention of a redness near [place] please advise

## 2023-10-17 NOTE — Telephone Encounter (Signed)
 Paperwork completed and placed in fax bin at back nurse station

## 2023-10-18 ENCOUNTER — Telehealth: Payer: Self-pay | Admitting: Family Medicine

## 2023-10-18 DIAGNOSIS — H409 Unspecified glaucoma: Secondary | ICD-10-CM | POA: Diagnosis not present

## 2023-10-18 DIAGNOSIS — I129 Hypertensive chronic kidney disease with stage 1 through stage 4 chronic kidney disease, or unspecified chronic kidney disease: Secondary | ICD-10-CM | POA: Diagnosis not present

## 2023-10-18 DIAGNOSIS — G309 Alzheimer's disease, unspecified: Secondary | ICD-10-CM | POA: Diagnosis not present

## 2023-10-18 DIAGNOSIS — M47812 Spondylosis without myelopathy or radiculopathy, cervical region: Secondary | ICD-10-CM | POA: Diagnosis not present

## 2023-10-18 DIAGNOSIS — K5900 Constipation, unspecified: Secondary | ICD-10-CM | POA: Diagnosis not present

## 2023-10-18 DIAGNOSIS — M19011 Primary osteoarthritis, right shoulder: Secondary | ICD-10-CM | POA: Diagnosis not present

## 2023-10-18 DIAGNOSIS — R3915 Urgency of urination: Secondary | ICD-10-CM | POA: Diagnosis not present

## 2023-10-18 DIAGNOSIS — M8580 Other specified disorders of bone density and structure, unspecified site: Secondary | ICD-10-CM | POA: Diagnosis not present

## 2023-10-18 DIAGNOSIS — K5790 Diverticulosis of intestine, part unspecified, without perforation or abscess without bleeding: Secondary | ICD-10-CM | POA: Diagnosis not present

## 2023-10-18 DIAGNOSIS — M170 Bilateral primary osteoarthritis of knee: Secondary | ICD-10-CM | POA: Diagnosis not present

## 2023-10-18 DIAGNOSIS — I2510 Atherosclerotic heart disease of native coronary artery without angina pectoris: Secondary | ICD-10-CM | POA: Diagnosis not present

## 2023-10-18 DIAGNOSIS — N183 Chronic kidney disease, stage 3 unspecified: Secondary | ICD-10-CM | POA: Diagnosis not present

## 2023-10-18 DIAGNOSIS — H269 Unspecified cataract: Secondary | ICD-10-CM | POA: Diagnosis not present

## 2023-10-18 DIAGNOSIS — H353132 Nonexudative age-related macular degeneration, bilateral, intermediate dry stage: Secondary | ICD-10-CM | POA: Diagnosis not present

## 2023-10-18 DIAGNOSIS — E871 Hypo-osmolality and hyponatremia: Secondary | ICD-10-CM | POA: Diagnosis not present

## 2023-10-18 DIAGNOSIS — E876 Hypokalemia: Secondary | ICD-10-CM | POA: Diagnosis not present

## 2023-10-18 DIAGNOSIS — S2232XD Fracture of one rib, left side, subsequent encounter for fracture with routine healing: Secondary | ICD-10-CM | POA: Diagnosis not present

## 2023-10-18 DIAGNOSIS — R21 Rash and other nonspecific skin eruption: Secondary | ICD-10-CM | POA: Diagnosis not present

## 2023-10-18 DIAGNOSIS — G4730 Sleep apnea, unspecified: Secondary | ICD-10-CM | POA: Diagnosis not present

## 2023-10-18 NOTE — Telephone Encounter (Signed)
 Home Health Certification or Plan of Care Tracking  Is this a Certification or Plan of Care? Plan of Care  [facility] Agency: [facility]  Order Number:  [id number]  Has charge sheet been attached? Yes  Where has form been placed:   Labeled & placed in provider bin

## 2023-10-18 NOTE — Telephone Encounter (Signed)
 Placed in your sign folder

## 2023-10-18 NOTE — Telephone Encounter (Signed)
 Attempted call to [facility] to ask about social work, no one was available did not leave VM will contact them again later

## 2023-10-19 ENCOUNTER — Encounter (HOSPITAL_COMMUNITY): Payer: Self-pay | Admitting: Emergency Medicine

## 2023-10-19 ENCOUNTER — Other Ambulatory Visit: Payer: Self-pay

## 2023-10-19 ENCOUNTER — Emergency Department (HOSPITAL_COMMUNITY)
Admission: EM | Admit: 2023-10-19 | Discharge: 2023-10-19 | Disposition: A | Discharge status: Home / Self Care | Attending: Emergency Medicine | Admitting: Emergency Medicine

## 2023-10-19 ENCOUNTER — Other Ambulatory Visit: Payer: Self-pay | Admitting: Family Medicine

## 2023-10-19 DIAGNOSIS — R21 Rash and other nonspecific skin eruption: Secondary | ICD-10-CM | POA: Diagnosis not present

## 2023-10-19 DIAGNOSIS — Z743 Need for continuous supervision: Secondary | ICD-10-CM | POA: Diagnosis not present

## 2023-10-19 DIAGNOSIS — N390 Urinary tract infection, site not specified: Secondary | ICD-10-CM | POA: Diagnosis not present

## 2023-10-19 DIAGNOSIS — E039 Hypothyroidism, unspecified: Secondary | ICD-10-CM

## 2023-10-19 DIAGNOSIS — R300 Dysuria: Secondary | ICD-10-CM | POA: Diagnosis present

## 2023-10-19 LAB — BASIC METABOLIC PANEL
Anion gap: 11 (ref 5–15)
BUN: 17 mg/dL (ref 8–23)
CO2: 20 mmol/L — ABNORMAL LOW (ref 22–32)
Calcium: 8.9 mg/dL (ref 8.9–10.3)
Chloride: 103 mmol/L (ref 98–111)
Creatinine, Ser: 1.51 mg/dL — ABNORMAL HIGH (ref 0.44–1.00)
GFR, Estimated: 35 mL/min — ABNORMAL LOW (ref >60)
Glucose, Bld: 118 mg/dL — ABNORMAL HIGH (ref 70–99)
Potassium: 4.1 mmol/L (ref 3.5–5.1)
Sodium: 134 mmol/L — ABNORMAL LOW (ref 135–145)

## 2023-10-19 LAB — URINALYSIS, W/ REFLEX TO CULTURE (INFECTION SUSPECTED)
Bilirubin Urine: NEGATIVE
Glucose, UA: NEGATIVE mg/dL
Ketones, ur: NEGATIVE mg/dL
Nitrite: NEGATIVE
Protein, ur: NEGATIVE mg/dL
Specific Gravity, Urine: 1.005 (ref 1.005–1.030)
pH: 5 (ref 5.0–8.0)

## 2023-10-19 LAB — CBC WITH DIFFERENTIAL/PLATELET
Abs Immature Granulocytes: 0.5 10*3/uL — ABNORMAL HIGH (ref 0.00–0.07)
Band Neutrophils: 1 %
Basophils Absolute: 0 10*3/uL (ref 0.0–0.1)
Basophils Relative: 0 %
Eosinophils Absolute: 0 10*3/uL (ref 0.0–0.5)
Eosinophils Relative: 0 %
HCT: 32.9 % — ABNORMAL LOW (ref 36.0–46.0)
Hemoglobin: 10.4 g/dL — ABNORMAL LOW (ref 12.0–15.0)
Lymphocytes Relative: 28 %
Lymphs Abs: 3 10*3/uL (ref 0.7–4.0)
MCH: 30.7 pg (ref 26.0–34.0)
MCHC: 31.6 g/dL (ref 30.0–36.0)
MCV: 97.1 fL (ref 80.0–100.0)
Metamyelocytes Relative: 1 %
Monocytes Absolute: 0.4 10*3/uL (ref 0.1–1.0)
Monocytes Relative: 4 %
Myelocytes: 4 %
Neutro Abs: 6.7 10*3/uL (ref 1.7–7.7)
Neutrophils Relative %: 62 %
Platelets: 195 10*3/uL (ref 150–400)
RBC: 3.39 MIL/uL — ABNORMAL LOW (ref 3.87–5.11)
RDW: 19.1 % — ABNORMAL HIGH (ref 11.5–15.5)
WBC: 10.6 10*3/uL — ABNORMAL HIGH (ref 4.0–10.5)
nRBC: 0 % (ref 0.0–0.2)

## 2023-10-19 MED ORDER — AMPICILLIN 500 MG PO CAPS
500.0000 mg | ORAL_CAPSULE | Freq: Four times a day (QID) | ORAL | 0 refills | Status: AC
Start: 2023-10-19 — End: 2023-10-24

## 2023-10-19 MED ORDER — LEVOTHYROXINE SODIUM 25 MCG PO TABS: 25.0000 ug | ORAL_TABLET | Freq: Every day | ORAL | 1 refills | Status: AC

## 2023-10-19 NOTE — Discharge Instructions (Addendum)
 Stop taking the Keflex.  Follow-up with your doctor

## 2023-10-19 NOTE — ED Notes (Signed)
Pt ambulated to bathroom, unable to provide urine sample.

## 2023-10-19 NOTE — ED Provider Notes (Addendum)
  EMERGENCY DEPARTMENT AT [facility] Provider Note   CSN: [id number] Arrival date & time: 10/19/23  1951     History  Chief Complaint  Patient presents with   Urinary Tract Infection    Allison Chavez is a 83 y.o. female.  83 year old female presents due to concern for UTI.  Endorses dysuria as well as urgency.  Denies any vomiting.  No fever or chills.  No flank pain.  History of UTI in the past and this feels similar.  Patient reportedly has had emesis once a day for 3 days but none currently.       Home Medications Prior to Admission medications   Medication Sig Start Date End Date Taking? Authorizing Provider  cephALEXin (KEFLEX) 500 MG capsule Take 1 capsule (500 mg total) by mouth 2 (two) times daily. Patient not taking: Reported on 10/16/2023 10/07/23   Durwin Glaze, MD  levothyroxine (SYNTHROID) 25 MCG tablet Take 1 tablet (25 mcg total) by mouth daily. 10/19/23   Shade Flood, MD  QUEtiapine (SEROQUEL) 25 MG tablet Take 1 tablet (25 mg total) by mouth at bedtime as needed. 10/16/23   Shade Flood, MD  thiamine 100 MG tablet Take 1 tablet (100 mg total) by mouth daily. 12/28/17   Albertine Grates, MD      Allergies    Ace inhibitors, Sulfa antibiotics, and Wellbutrin [bupropion]    Review of Systems   Review of Systems  All other systems reviewed and are negative.   Physical Exam Updated Vital Signs BP 130/85 (BP Location: Left Arm)   Pulse 84   Temp 97.9 F (36.6 C) (Oral)   Resp 16   Ht 1.575 m (5\' 2" )   Wt 67.1 kg   SpO2 99%   BMI 27.07 kg/m  Physical Exam Vitals and nursing note reviewed.  Constitutional:      General: She is not in acute distress.    Appearance: Normal appearance. She is well-developed. She is not toxic-appearing.  HENT:     Head: Normocephalic and atraumatic.  Eyes:     General: Lids are normal.     Conjunctiva/sclera: Conjunctivae normal.     Pupils: Pupils are equal, round, and reactive to light.  Neck:      Thyroid: No thyroid mass.     Trachea: No tracheal deviation.  Cardiovascular:     Rate and Rhythm: Normal rate and regular rhythm.     Heart sounds: Normal heart sounds. No murmur heard.    No gallop.  Pulmonary:     Effort: Pulmonary effort is normal. No respiratory distress.     Breath sounds: Normal breath sounds. No stridor. No decreased breath sounds, wheezing, rhonchi or rales.  Abdominal:     General: There is no distension.     Palpations: Abdomen is soft.     Tenderness: There is no abdominal tenderness. There is no rebound.  Musculoskeletal:        General: No tenderness. Normal range of motion.     Cervical back: Normal range of motion and neck supple.  Skin:    General: Skin is warm and dry.     Findings: No abrasion or rash.  Neurological:     Mental Status: She is alert and oriented to person, place, and time. Mental status is at baseline.     GCS: GCS eye subscore is 4. GCS verbal subscore is 5. GCS motor subscore is 6.     Cranial Nerves: No  cranial nerve deficit.     Sensory: No sensory deficit.     Motor: Motor function is intact.  Psychiatric:        Attention and Perception: Attention normal.        Speech: Speech normal.        Behavior: Behavior normal.     ED Results / Procedures / Treatments   Labs (all labs ordered are listed, but only abnormal results are displayed) Labs Reviewed  CBC WITH DIFFERENTIAL/PLATELET  BASIC METABOLIC PANEL  URINALYSIS, W/ REFLEX TO CULTURE (INFECTION SUSPECTED)    EKG None  Radiology No results found.  Procedures Procedures    Medications Ordered in ED Medications - No data to display  ED Course/ Medical Decision Making/ A&P                                 Medical Decision Making Amount and/or Complexity of Data Reviewed Labs: ordered.  Risk Prescription drug management.   Patient's urinalysis consistent with infection.  Labs otherwise unremarkable.  He has no evidence of urosepsis at this  time.  Urine culture report was reviewed and patient is on Keflex which covers one of the bacteria but does not cover the other 1.  Her culture did grow out Enterococcus faecalis and that was sensitive to ampicillin.  The patient also grew out E. coli which is sensitive to this as well.  Will discontinue Keflex.  Will discharge home     Final Clinical Impression(s) / ED Diagnoses Final diagnoses:  None    Rx / DC Orders ED Discharge Orders     None         [name], MD 10/19/23 2247    [name], MD 10/19/23 2249

## 2023-10-19 NOTE — Progress Notes (Signed)
 See lab results.

## 2023-10-19 NOTE — ED Triage Notes (Signed)
 Pt to ED via [facility] from home c/o possible UTI and recommended to come in by PCP.  Pt states vaginal redness and rash x4 days, vomiting once a day for last 3 days, urinary frequency and occasional pain with urination.  Pt A&Ox4.  EMS vitals: 126/80, 88 HR, 97% RA, 16 RR, 156 CBG.

## 2023-10-21 ENCOUNTER — Telehealth: Payer: Self-pay | Admitting: Family Medicine

## 2023-10-21 DIAGNOSIS — M47812 Spondylosis without myelopathy or radiculopathy, cervical region: Secondary | ICD-10-CM | POA: Diagnosis not present

## 2023-10-21 DIAGNOSIS — G309 Alzheimer's disease, unspecified: Secondary | ICD-10-CM | POA: Diagnosis not present

## 2023-10-21 DIAGNOSIS — M170 Bilateral primary osteoarthritis of knee: Secondary | ICD-10-CM | POA: Diagnosis not present

## 2023-10-21 DIAGNOSIS — H353132 Nonexudative age-related macular degeneration, bilateral, intermediate dry stage: Secondary | ICD-10-CM | POA: Diagnosis not present

## 2023-10-21 DIAGNOSIS — R21 Rash and other nonspecific skin eruption: Secondary | ICD-10-CM | POA: Diagnosis not present

## 2023-10-21 DIAGNOSIS — N183 Chronic kidney disease, stage 3 unspecified: Secondary | ICD-10-CM | POA: Diagnosis not present

## 2023-10-21 DIAGNOSIS — I129 Hypertensive chronic kidney disease with stage 1 through stage 4 chronic kidney disease, or unspecified chronic kidney disease: Secondary | ICD-10-CM | POA: Diagnosis not present

## 2023-10-21 DIAGNOSIS — E871 Hypo-osmolality and hyponatremia: Secondary | ICD-10-CM | POA: Diagnosis not present

## 2023-10-21 DIAGNOSIS — I2510 Atherosclerotic heart disease of native coronary artery without angina pectoris: Secondary | ICD-10-CM | POA: Diagnosis not present

## 2023-10-21 DIAGNOSIS — S2232XD Fracture of one rib, left side, subsequent encounter for fracture with routine healing: Secondary | ICD-10-CM | POA: Diagnosis not present

## 2023-10-21 DIAGNOSIS — M19011 Primary osteoarthritis, right shoulder: Secondary | ICD-10-CM | POA: Diagnosis not present

## 2023-10-21 DIAGNOSIS — H409 Unspecified glaucoma: Secondary | ICD-10-CM | POA: Diagnosis not present

## 2023-10-21 DIAGNOSIS — M8580 Other specified disorders of bone density and structure, unspecified site: Secondary | ICD-10-CM | POA: Diagnosis not present

## 2023-10-21 DIAGNOSIS — G4730 Sleep apnea, unspecified: Secondary | ICD-10-CM | POA: Diagnosis not present

## 2023-10-21 DIAGNOSIS — H269 Unspecified cataract: Secondary | ICD-10-CM | POA: Diagnosis not present

## 2023-10-21 DIAGNOSIS — K5900 Constipation, unspecified: Secondary | ICD-10-CM | POA: Diagnosis not present

## 2023-10-21 DIAGNOSIS — R3915 Urgency of urination: Secondary | ICD-10-CM | POA: Diagnosis not present

## 2023-10-21 DIAGNOSIS — E876 Hypokalemia: Secondary | ICD-10-CM | POA: Diagnosis not present

## 2023-10-21 DIAGNOSIS — K5790 Diverticulosis of intestine, part unspecified, without perforation or abscess without bleeding: Secondary | ICD-10-CM | POA: Diagnosis not present

## 2023-10-21 NOTE — Telephone Encounter (Signed)
 Paperwork completed and placed in fax bin at back nurse station

## 2023-10-21 NOTE — Telephone Encounter (Signed)
 Placed in folder at nurse station

## 2023-10-21 NOTE — Telephone Encounter (Signed)
 Home Health Certification or Plan of Care Tracking  Is this a Certification or Plan of Care? Plan of Care  [facility] Agency: [facility]  Order Number:  [id number]  Has charge sheet been attached? Yes  Where has form been placed:  Labeled & placed in provider bin

## 2023-10-21 NOTE — Telephone Encounter (Signed)
 Type of form received: Adult Daycare Enrollment  Additional comments:   Received by: Wilford Sports - Front Desk  Form should be Faxed/mailed to: (address/ fax #) Fax to [phone]  Is patient requesting call for pickup: N/A  Form placed:  Labeled & placed in provider bin  Attach charge sheet.  Provider will determine charge.  Individual made aware of 3-5 business day turn around? N/A

## 2023-10-21 NOTE — Addendum Note (Signed)
 Addended by: Eldred Manges on: 10/21/2023 04:06 PM   Modules accepted: Orders

## 2023-10-21 NOTE — Telephone Encounter (Signed)
 Home Health Certification or Plan of Care Tracking  Is this a Certification or Plan of Care? Plan of Care  [facility] Agency: [facility]  Order Number:  [id number]  Has charge sheet been attached? Yes  Where has form been placed:  Labeled & placed in provider bin

## 2023-10-22 DIAGNOSIS — S2232XD Fracture of one rib, left side, subsequent encounter for fracture with routine healing: Secondary | ICD-10-CM | POA: Diagnosis not present

## 2023-10-22 DIAGNOSIS — M47812 Spondylosis without myelopathy or radiculopathy, cervical region: Secondary | ICD-10-CM | POA: Diagnosis not present

## 2023-10-22 DIAGNOSIS — E871 Hypo-osmolality and hyponatremia: Secondary | ICD-10-CM | POA: Diagnosis not present

## 2023-10-22 DIAGNOSIS — G4730 Sleep apnea, unspecified: Secondary | ICD-10-CM | POA: Diagnosis not present

## 2023-10-22 DIAGNOSIS — M8580 Other specified disorders of bone density and structure, unspecified site: Secondary | ICD-10-CM | POA: Diagnosis not present

## 2023-10-22 DIAGNOSIS — K5790 Diverticulosis of intestine, part unspecified, without perforation or abscess without bleeding: Secondary | ICD-10-CM | POA: Diagnosis not present

## 2023-10-22 DIAGNOSIS — N183 Chronic kidney disease, stage 3 unspecified: Secondary | ICD-10-CM | POA: Diagnosis not present

## 2023-10-22 DIAGNOSIS — H409 Unspecified glaucoma: Secondary | ICD-10-CM | POA: Diagnosis not present

## 2023-10-22 DIAGNOSIS — M19011 Primary osteoarthritis, right shoulder: Secondary | ICD-10-CM | POA: Diagnosis not present

## 2023-10-22 DIAGNOSIS — K5900 Constipation, unspecified: Secondary | ICD-10-CM | POA: Diagnosis not present

## 2023-10-22 DIAGNOSIS — R21 Rash and other nonspecific skin eruption: Secondary | ICD-10-CM | POA: Diagnosis not present

## 2023-10-22 DIAGNOSIS — R3915 Urgency of urination: Secondary | ICD-10-CM | POA: Diagnosis not present

## 2023-10-22 DIAGNOSIS — I2510 Atherosclerotic heart disease of native coronary artery without angina pectoris: Secondary | ICD-10-CM | POA: Diagnosis not present

## 2023-10-22 DIAGNOSIS — M170 Bilateral primary osteoarthritis of knee: Secondary | ICD-10-CM | POA: Diagnosis not present

## 2023-10-22 DIAGNOSIS — H269 Unspecified cataract: Secondary | ICD-10-CM | POA: Diagnosis not present

## 2023-10-22 DIAGNOSIS — E876 Hypokalemia: Secondary | ICD-10-CM | POA: Diagnosis not present

## 2023-10-22 DIAGNOSIS — I129 Hypertensive chronic kidney disease with stage 1 through stage 4 chronic kidney disease, or unspecified chronic kidney disease: Secondary | ICD-10-CM | POA: Diagnosis not present

## 2023-10-22 DIAGNOSIS — G309 Alzheimer's disease, unspecified: Secondary | ICD-10-CM | POA: Diagnosis not present

## 2023-10-22 DIAGNOSIS — H353132 Nonexudative age-related macular degeneration, bilateral, intermediate dry stage: Secondary | ICD-10-CM | POA: Diagnosis not present

## 2023-10-22 LAB — URINE CULTURE: Culture: 50000 — AB

## 2023-10-23 ENCOUNTER — Telehealth (HOSPITAL_BASED_OUTPATIENT_CLINIC_OR_DEPARTMENT_OTHER): Payer: Self-pay | Admitting: *Deleted

## 2023-10-23 DIAGNOSIS — I2510 Atherosclerotic heart disease of native coronary artery without angina pectoris: Secondary | ICD-10-CM | POA: Diagnosis not present

## 2023-10-23 DIAGNOSIS — S2232XD Fracture of one rib, left side, subsequent encounter for fracture with routine healing: Secondary | ICD-10-CM | POA: Diagnosis not present

## 2023-10-23 DIAGNOSIS — M170 Bilateral primary osteoarthritis of knee: Secondary | ICD-10-CM | POA: Diagnosis not present

## 2023-10-23 DIAGNOSIS — R3915 Urgency of urination: Secondary | ICD-10-CM | POA: Diagnosis not present

## 2023-10-23 DIAGNOSIS — K5900 Constipation, unspecified: Secondary | ICD-10-CM | POA: Diagnosis not present

## 2023-10-23 DIAGNOSIS — K5790 Diverticulosis of intestine, part unspecified, without perforation or abscess without bleeding: Secondary | ICD-10-CM | POA: Diagnosis not present

## 2023-10-23 DIAGNOSIS — R21 Rash and other nonspecific skin eruption: Secondary | ICD-10-CM | POA: Diagnosis not present

## 2023-10-23 DIAGNOSIS — N183 Chronic kidney disease, stage 3 unspecified: Secondary | ICD-10-CM | POA: Diagnosis not present

## 2023-10-23 DIAGNOSIS — H269 Unspecified cataract: Secondary | ICD-10-CM | POA: Diagnosis not present

## 2023-10-23 DIAGNOSIS — I129 Hypertensive chronic kidney disease with stage 1 through stage 4 chronic kidney disease, or unspecified chronic kidney disease: Secondary | ICD-10-CM | POA: Diagnosis not present

## 2023-10-23 DIAGNOSIS — H409 Unspecified glaucoma: Secondary | ICD-10-CM | POA: Diagnosis not present

## 2023-10-23 DIAGNOSIS — G4730 Sleep apnea, unspecified: Secondary | ICD-10-CM | POA: Diagnosis not present

## 2023-10-23 DIAGNOSIS — M47812 Spondylosis without myelopathy or radiculopathy, cervical region: Secondary | ICD-10-CM | POA: Diagnosis not present

## 2023-10-23 DIAGNOSIS — M8580 Other specified disorders of bone density and structure, unspecified site: Secondary | ICD-10-CM | POA: Diagnosis not present

## 2023-10-23 DIAGNOSIS — M19011 Primary osteoarthritis, right shoulder: Secondary | ICD-10-CM | POA: Diagnosis not present

## 2023-10-23 DIAGNOSIS — H353132 Nonexudative age-related macular degeneration, bilateral, intermediate dry stage: Secondary | ICD-10-CM | POA: Diagnosis not present

## 2023-10-23 DIAGNOSIS — E871 Hypo-osmolality and hyponatremia: Secondary | ICD-10-CM | POA: Diagnosis not present

## 2023-10-23 DIAGNOSIS — G309 Alzheimer's disease, unspecified: Secondary | ICD-10-CM | POA: Diagnosis not present

## 2023-10-23 DIAGNOSIS — E876 Hypokalemia: Secondary | ICD-10-CM | POA: Diagnosis not present

## 2023-10-23 NOTE — Telephone Encounter (Signed)
 Post ED Visit - Positive Culture Follow-up  Culture report reviewed by antimicrobial stewardship pharmacist: [facility] []  Enzo Bi, Pharm.D. []  Celedonio Miyamoto, Pharm.D., BCPS AQ-ID []  Garvin Fila, Pharm.D., BCPS [x]  Georgina Pillion, [address].D., BCPS []  [place], [address].D., BCPS, AAHIVP []  Estella Husk, Pharm.D., BCPS, AAHIVP []  Lysle Pearl, PharmD, BCPS []  Phillips Climes, PharmD, BCPS []  Agapito Games, PharmD, BCPS []  Verlan Friends, PharmD []  Mervyn Gay, PharmD, BCPS []  Vinnie Level, PharmD  [facility] []  Len Childs, PharmD []  Greer Pickerel, PharmD []  Adalberto Cole, PharmD []  Perlie Gold, Rph []  Lonell Face) Jean Rosenthal, PharmD []  Earl Many, PharmD []  Junita Push, PharmD []  Dorna Leitz, PharmD []  Terrilee Files, PharmD []  Lynann Beaver, PharmD []  Keturah Barre, PharmD []  Loralee Pacas, PharmD []  Bernadene Person, PharmD   Positive urine culture Treated with Ampicillin, organism sensitive to the same and no further patient follow-up is required at this time.  Nena Polio Garner Nash 10/23/2023, 2:09 PM

## 2023-10-24 NOTE — Telephone Encounter (Signed)
 Paperwork completed and placed in fax bin at back nurse station

## 2023-10-25 ENCOUNTER — Telehealth: Payer: Self-pay | Admitting: Family Medicine

## 2023-10-25 DIAGNOSIS — N183 Chronic kidney disease, stage 3 unspecified: Secondary | ICD-10-CM | POA: Diagnosis not present

## 2023-10-25 DIAGNOSIS — I129 Hypertensive chronic kidney disease with stage 1 through stage 4 chronic kidney disease, or unspecified chronic kidney disease: Secondary | ICD-10-CM | POA: Diagnosis not present

## 2023-10-25 DIAGNOSIS — E871 Hypo-osmolality and hyponatremia: Secondary | ICD-10-CM | POA: Diagnosis not present

## 2023-10-25 DIAGNOSIS — M170 Bilateral primary osteoarthritis of knee: Secondary | ICD-10-CM | POA: Diagnosis not present

## 2023-10-25 DIAGNOSIS — R3915 Urgency of urination: Secondary | ICD-10-CM | POA: Diagnosis not present

## 2023-10-25 DIAGNOSIS — M47812 Spondylosis without myelopathy or radiculopathy, cervical region: Secondary | ICD-10-CM | POA: Diagnosis not present

## 2023-10-25 DIAGNOSIS — M19011 Primary osteoarthritis, right shoulder: Secondary | ICD-10-CM | POA: Diagnosis not present

## 2023-10-25 DIAGNOSIS — H269 Unspecified cataract: Secondary | ICD-10-CM | POA: Diagnosis not present

## 2023-10-25 DIAGNOSIS — R21 Rash and other nonspecific skin eruption: Secondary | ICD-10-CM | POA: Diagnosis not present

## 2023-10-25 DIAGNOSIS — K5790 Diverticulosis of intestine, part unspecified, without perforation or abscess without bleeding: Secondary | ICD-10-CM | POA: Diagnosis not present

## 2023-10-25 DIAGNOSIS — E876 Hypokalemia: Secondary | ICD-10-CM | POA: Diagnosis not present

## 2023-10-25 DIAGNOSIS — G4730 Sleep apnea, unspecified: Secondary | ICD-10-CM | POA: Diagnosis not present

## 2023-10-25 DIAGNOSIS — S2232XD Fracture of one rib, left side, subsequent encounter for fracture with routine healing: Secondary | ICD-10-CM | POA: Diagnosis not present

## 2023-10-25 DIAGNOSIS — M8580 Other specified disorders of bone density and structure, unspecified site: Secondary | ICD-10-CM | POA: Diagnosis not present

## 2023-10-25 DIAGNOSIS — H353132 Nonexudative age-related macular degeneration, bilateral, intermediate dry stage: Secondary | ICD-10-CM | POA: Diagnosis not present

## 2023-10-25 DIAGNOSIS — K5900 Constipation, unspecified: Secondary | ICD-10-CM | POA: Diagnosis not present

## 2023-10-25 DIAGNOSIS — I2510 Atherosclerotic heart disease of native coronary artery without angina pectoris: Secondary | ICD-10-CM | POA: Diagnosis not present

## 2023-10-25 DIAGNOSIS — H409 Unspecified glaucoma: Secondary | ICD-10-CM | POA: Diagnosis not present

## 2023-10-25 DIAGNOSIS — G309 Alzheimer's disease, unspecified: Secondary | ICD-10-CM | POA: Diagnosis not present

## 2023-10-25 NOTE — Telephone Encounter (Signed)
 Paperwork completed and placed in fax bin at back nurse station

## 2023-10-25 NOTE — Telephone Encounter (Signed)
 Placed in your Sign folder

## 2023-10-25 NOTE — Telephone Encounter (Signed)
Faxed back to the requested number

## 2023-10-25 NOTE — Telephone Encounter (Signed)
 Type of form received: Prescription Order  Additional comments:   Received by: Wilford Sports - Front Desk  Form should be Faxed/mailed to: (address/ fax #) Fax to [phone]  Is patient requesting call for pickup: N/A  Form placed:  Labeled & placed in provider bin  Attach charge sheet.  Provider will determine charge.  Individual made aware of 3-5 business day turn around? N/A

## 2023-10-28 ENCOUNTER — Telehealth: Payer: Self-pay | Admitting: Family Medicine

## 2023-10-28 DIAGNOSIS — H353132 Nonexudative age-related macular degeneration, bilateral, intermediate dry stage: Secondary | ICD-10-CM | POA: Diagnosis not present

## 2023-10-28 DIAGNOSIS — E871 Hypo-osmolality and hyponatremia: Secondary | ICD-10-CM | POA: Diagnosis not present

## 2023-10-28 DIAGNOSIS — M47812 Spondylosis without myelopathy or radiculopathy, cervical region: Secondary | ICD-10-CM | POA: Diagnosis not present

## 2023-10-28 DIAGNOSIS — I129 Hypertensive chronic kidney disease with stage 1 through stage 4 chronic kidney disease, or unspecified chronic kidney disease: Secondary | ICD-10-CM | POA: Diagnosis not present

## 2023-10-28 DIAGNOSIS — M8580 Other specified disorders of bone density and structure, unspecified site: Secondary | ICD-10-CM | POA: Diagnosis not present

## 2023-10-28 DIAGNOSIS — M170 Bilateral primary osteoarthritis of knee: Secondary | ICD-10-CM | POA: Diagnosis not present

## 2023-10-28 DIAGNOSIS — K5790 Diverticulosis of intestine, part unspecified, without perforation or abscess without bleeding: Secondary | ICD-10-CM | POA: Diagnosis not present

## 2023-10-28 DIAGNOSIS — N183 Chronic kidney disease, stage 3 unspecified: Secondary | ICD-10-CM | POA: Diagnosis not present

## 2023-10-28 DIAGNOSIS — G4730 Sleep apnea, unspecified: Secondary | ICD-10-CM | POA: Diagnosis not present

## 2023-10-28 DIAGNOSIS — I2510 Atherosclerotic heart disease of native coronary artery without angina pectoris: Secondary | ICD-10-CM | POA: Diagnosis not present

## 2023-10-28 DIAGNOSIS — E876 Hypokalemia: Secondary | ICD-10-CM | POA: Diagnosis not present

## 2023-10-28 DIAGNOSIS — S2232XD Fracture of one rib, left side, subsequent encounter for fracture with routine healing: Secondary | ICD-10-CM | POA: Diagnosis not present

## 2023-10-28 DIAGNOSIS — M19011 Primary osteoarthritis, right shoulder: Secondary | ICD-10-CM | POA: Diagnosis not present

## 2023-10-28 DIAGNOSIS — G309 Alzheimer's disease, unspecified: Secondary | ICD-10-CM | POA: Diagnosis not present

## 2023-10-28 DIAGNOSIS — R3915 Urgency of urination: Secondary | ICD-10-CM | POA: Diagnosis not present

## 2023-10-28 DIAGNOSIS — K5900 Constipation, unspecified: Secondary | ICD-10-CM | POA: Diagnosis not present

## 2023-10-28 DIAGNOSIS — H409 Unspecified glaucoma: Secondary | ICD-10-CM | POA: Diagnosis not present

## 2023-10-28 DIAGNOSIS — H269 Unspecified cataract: Secondary | ICD-10-CM | POA: Diagnosis not present

## 2023-10-28 DIAGNOSIS — R21 Rash and other nonspecific skin eruption: Secondary | ICD-10-CM | POA: Diagnosis not present

## 2023-10-28 NOTE — Telephone Encounter (Signed)
 Home Health Certification or Plan of Care Tracking  Is this a Certification or Plan of Care? Plan of Care  [facility] Agency: [facility]  Order Number:  [id number]  Has charge sheet been attached? Yes  Where has form been placed:   Labeled & placed in provider bin

## 2023-10-29 ENCOUNTER — Emergency Department (HOSPITAL_BASED_OUTPATIENT_CLINIC_OR_DEPARTMENT_OTHER)

## 2023-10-29 ENCOUNTER — Encounter (HOSPITAL_COMMUNITY): Payer: Self-pay

## 2023-10-29 ENCOUNTER — Observation Stay (HOSPITAL_COMMUNITY)
Admission: EM | Admit: 2023-10-29 | Discharge: 2023-11-01 | Disposition: A | Discharge status: Skilled Nursing Facility | Attending: Emergency Medicine | Admitting: Emergency Medicine

## 2023-10-29 ENCOUNTER — Other Ambulatory Visit: Payer: Self-pay

## 2023-10-29 ENCOUNTER — Emergency Department (HOSPITAL_COMMUNITY)

## 2023-10-29 DIAGNOSIS — K5900 Constipation, unspecified: Secondary | ICD-10-CM | POA: Diagnosis not present

## 2023-10-29 DIAGNOSIS — H353132 Nonexudative age-related macular degeneration, bilateral, intermediate dry stage: Secondary | ICD-10-CM | POA: Diagnosis not present

## 2023-10-29 DIAGNOSIS — S2232XD Fracture of one rib, left side, subsequent encounter for fracture with routine healing: Secondary | ICD-10-CM | POA: Diagnosis not present

## 2023-10-29 DIAGNOSIS — Z87891 Personal history of nicotine dependence: Secondary | ICD-10-CM | POA: Diagnosis not present

## 2023-10-29 DIAGNOSIS — N1832 Chronic kidney disease, stage 3b: Secondary | ICD-10-CM | POA: Insufficient documentation

## 2023-10-29 DIAGNOSIS — M79661 Pain in right lower leg: Secondary | ICD-10-CM | POA: Diagnosis not present

## 2023-10-29 DIAGNOSIS — S22000A Wedge compression fracture of unspecified thoracic vertebra, initial encounter for closed fracture: Secondary | ICD-10-CM | POA: Diagnosis present

## 2023-10-29 DIAGNOSIS — S22079A Unspecified fracture of T9-T10 vertebra, initial encounter for closed fracture: Secondary | ICD-10-CM | POA: Diagnosis not present

## 2023-10-29 DIAGNOSIS — K5790 Diverticulosis of intestine, part unspecified, without perforation or abscess without bleeding: Secondary | ICD-10-CM | POA: Diagnosis not present

## 2023-10-29 DIAGNOSIS — D631 Anemia in chronic kidney disease: Secondary | ICD-10-CM | POA: Diagnosis not present

## 2023-10-29 DIAGNOSIS — F0280 Dementia in other diseases classified elsewhere without behavioral disturbance: Secondary | ICD-10-CM | POA: Insufficient documentation

## 2023-10-29 DIAGNOSIS — E871 Hypo-osmolality and hyponatremia: Secondary | ICD-10-CM | POA: Diagnosis not present

## 2023-10-29 DIAGNOSIS — I129 Hypertensive chronic kidney disease with stage 1 through stage 4 chronic kidney disease, or unspecified chronic kidney disease: Secondary | ICD-10-CM | POA: Diagnosis not present

## 2023-10-29 DIAGNOSIS — R1030 Lower abdominal pain, unspecified: Secondary | ICD-10-CM | POA: Diagnosis present

## 2023-10-29 DIAGNOSIS — G309 Alzheimer's disease, unspecified: Secondary | ICD-10-CM | POA: Insufficient documentation

## 2023-10-29 DIAGNOSIS — R109 Unspecified abdominal pain: Secondary | ICD-10-CM | POA: Diagnosis not present

## 2023-10-29 DIAGNOSIS — H269 Unspecified cataract: Secondary | ICD-10-CM | POA: Diagnosis not present

## 2023-10-29 DIAGNOSIS — M19011 Primary osteoarthritis, right shoulder: Secondary | ICD-10-CM | POA: Diagnosis not present

## 2023-10-29 DIAGNOSIS — F411 Generalized anxiety disorder: Secondary | ICD-10-CM | POA: Diagnosis not present

## 2023-10-29 DIAGNOSIS — S22070A Wedge compression fracture of T9-T10 vertebra, initial encounter for closed fracture: Principal | ICD-10-CM

## 2023-10-29 DIAGNOSIS — I2510 Atherosclerotic heart disease of native coronary artery without angina pectoris: Secondary | ICD-10-CM | POA: Diagnosis not present

## 2023-10-29 DIAGNOSIS — K5730 Diverticulosis of large intestine without perforation or abscess without bleeding: Secondary | ICD-10-CM | POA: Diagnosis not present

## 2023-10-29 DIAGNOSIS — E039 Hypothyroidism, unspecified: Secondary | ICD-10-CM | POA: Diagnosis not present

## 2023-10-29 DIAGNOSIS — X58XXXA Exposure to other specified factors, initial encounter: Secondary | ICD-10-CM | POA: Diagnosis not present

## 2023-10-29 DIAGNOSIS — Z9181 History of falling: Secondary | ICD-10-CM | POA: Diagnosis not present

## 2023-10-29 DIAGNOSIS — H409 Unspecified glaucoma: Secondary | ICD-10-CM | POA: Diagnosis not present

## 2023-10-29 DIAGNOSIS — E876 Hypokalemia: Secondary | ICD-10-CM | POA: Diagnosis not present

## 2023-10-29 DIAGNOSIS — N390 Urinary tract infection, site not specified: Secondary | ICD-10-CM | POA: Diagnosis not present

## 2023-10-29 DIAGNOSIS — R3915 Urgency of urination: Secondary | ICD-10-CM | POA: Diagnosis not present

## 2023-10-29 DIAGNOSIS — R21 Rash and other nonspecific skin eruption: Secondary | ICD-10-CM | POA: Diagnosis not present

## 2023-10-29 DIAGNOSIS — N183 Chronic kidney disease, stage 3 unspecified: Secondary | ICD-10-CM | POA: Diagnosis present

## 2023-10-29 DIAGNOSIS — M47812 Spondylosis without myelopathy or radiculopathy, cervical region: Secondary | ICD-10-CM | POA: Diagnosis not present

## 2023-10-29 DIAGNOSIS — G4730 Sleep apnea, unspecified: Secondary | ICD-10-CM | POA: Diagnosis not present

## 2023-10-29 DIAGNOSIS — M8580 Other specified disorders of bone density and structure, unspecified site: Secondary | ICD-10-CM | POA: Diagnosis not present

## 2023-10-29 DIAGNOSIS — M170 Bilateral primary osteoarthritis of knee: Secondary | ICD-10-CM | POA: Diagnosis not present

## 2023-10-29 LAB — CBC WITH DIFFERENTIAL/PLATELET
Abs Immature Granulocytes: 0.23 10*3/uL — ABNORMAL HIGH (ref 0.00–0.07)
Basophils Absolute: 0 10*3/uL (ref 0.0–0.1)
Basophils Relative: 1 %
Eosinophils Absolute: 0.1 10*3/uL (ref 0.0–0.5)
Eosinophils Relative: 2 %
HCT: 31.1 % — ABNORMAL LOW (ref 36.0–46.0)
Hemoglobin: 9.6 g/dL — ABNORMAL LOW (ref 12.0–15.0)
Immature Granulocytes: 4 %
Lymphocytes Relative: 35 %
Lymphs Abs: 2.2 10*3/uL (ref 0.7–4.0)
MCH: 30.4 pg (ref 26.0–34.0)
MCHC: 30.9 g/dL (ref 30.0–36.0)
MCV: 98.4 fL (ref 80.0–100.0)
Monocytes Absolute: 1.1 10*3/uL — ABNORMAL HIGH (ref 0.1–1.0)
Monocytes Relative: 17 %
Neutro Abs: 2.6 10*3/uL (ref 1.7–7.7)
Neutrophils Relative %: 41 %
Platelets: 209 10*3/uL (ref 150–400)
RBC: 3.16 MIL/uL — ABNORMAL LOW (ref 3.87–5.11)
RDW: 18.9 % — ABNORMAL HIGH (ref 11.5–15.5)
WBC: 6.3 10*3/uL (ref 4.0–10.5)
nRBC: 0 % (ref 0.0–0.2)

## 2023-10-29 LAB — URINALYSIS, ROUTINE W REFLEX MICROSCOPIC
Bilirubin Urine: NEGATIVE
Glucose, UA: NEGATIVE mg/dL
Ketones, ur: NEGATIVE mg/dL
Leukocytes,Ua: NEGATIVE
Nitrite: NEGATIVE
Protein, ur: NEGATIVE mg/dL
Specific Gravity, Urine: 1.005 (ref 1.005–1.030)
pH: 7 (ref 5.0–8.0)

## 2023-10-29 LAB — COMPREHENSIVE METABOLIC PANEL WITH GFR
ALT: 10 U/L (ref 0–44)
AST: 13 U/L — ABNORMAL LOW (ref 15–41)
Albumin: 3.4 g/dL — ABNORMAL LOW (ref 3.5–5.0)
Alkaline Phosphatase: 76 U/L (ref 38–126)
Anion gap: 9 (ref 5–15)
BUN: 20 mg/dL (ref 8–23)
CO2: 22 mmol/L (ref 22–32)
Calcium: 9 mg/dL (ref 8.9–10.3)
Chloride: 104 mmol/L (ref 98–111)
Creatinine, Ser: 1.41 mg/dL — ABNORMAL HIGH (ref 0.44–1.00)
GFR, Estimated: 38 mL/min — ABNORMAL LOW (ref >60)
Glucose, Bld: 111 mg/dL — ABNORMAL HIGH (ref 70–99)
Potassium: 4.2 mmol/L (ref 3.5–5.1)
Sodium: 135 mmol/L (ref 135–145)
Total Bilirubin: 0.6 mg/dL (ref 0.0–1.2)
Total Protein: 6.7 g/dL (ref 6.5–8.1)

## 2023-10-29 MED ORDER — ONDANSETRON HCL 4 MG/2ML IJ SOLN: 4.0000 mg | Freq: Four times a day (QID) | INTRAMUSCULAR | Status: DC | PRN

## 2023-10-29 MED ORDER — POLYETHYLENE GLYCOL 3350 17 G PO PACK: 17.0000 g | PACK | Freq: Every day | ORAL | Status: DC | PRN

## 2023-10-29 MED ORDER — BISACODYL 5 MG PO TBEC: 5.0000 mg | DELAYED_RELEASE_TABLET | Freq: Every day | ORAL | Status: DC | PRN

## 2023-10-29 MED ORDER — ONDANSETRON HCL 4 MG PO TABS: 4.0000 mg | ORAL_TABLET | Freq: Four times a day (QID) | ORAL | Status: DC | PRN

## 2023-10-29 MED ORDER — LIDOCAINE 5 % EX PTCH
1.0000 | MEDICATED_PATCH | CUTANEOUS | Status: DC
  Administered 2023-10-29 – 2023-10-30 (×2): 1 via TRANSDERMAL
  Filled 2023-10-29 (×4): qty 1

## 2023-10-29 MED ORDER — HEPARIN SODIUM (PORCINE) 5000 UNIT/ML IJ SOLN
5000.0000 [IU] | Freq: Three times a day (TID) | INTRAMUSCULAR | Status: DC
  Administered 2023-10-29 – 2023-11-01 (×9): 5000 [IU] via SUBCUTANEOUS
  Filled 2023-10-29 (×9): qty 1

## 2023-10-29 MED ORDER — LEVOTHYROXINE SODIUM 25 MCG PO TABS
25.0000 ug | ORAL_TABLET | Freq: Every day | ORAL | Status: DC
  Administered 2023-10-30 – 2023-11-01 (×3): 25 ug via ORAL
  Filled 2023-10-29 (×3): qty 1

## 2023-10-29 MED ORDER — ACETAMINOPHEN 650 MG RE SUPP: 650.0000 mg | Freq: Four times a day (QID) | RECTAL | Status: DC | PRN

## 2023-10-29 MED ORDER — IOHEXOL 300 MG/ML  SOLN
80.0000 mL | Freq: Once | INTRAMUSCULAR | Status: AC | PRN
  Administered 2023-10-29: 80 mL via INTRAVENOUS

## 2023-10-29 MED ORDER — OXYCODONE HCL 5 MG PO TABS
2.5000 mg | ORAL_TABLET | ORAL | Status: DC | PRN
  Administered 2023-10-31: 2.5 mg via ORAL
  Filled 2023-10-29: qty 1

## 2023-10-29 MED ORDER — METHOCARBAMOL 1000 MG/10ML IJ SOLN
500.0000 mg | Freq: Four times a day (QID) | INTRAMUSCULAR | Status: DC | PRN
  Administered 2023-10-31: 500 mg via INTRAVENOUS
  Filled 2023-10-29: qty 10

## 2023-10-29 MED ORDER — QUETIAPINE FUMARATE 25 MG PO TABS
25.0000 mg | ORAL_TABLET | Freq: Every evening | ORAL | Status: DC | PRN
  Administered 2023-10-30 – 2023-10-31 (×3): 25 mg via ORAL
  Filled 2023-10-29 (×3): qty 1

## 2023-10-29 MED ORDER — FENTANYL CITRATE PF 50 MCG/ML IJ SOSY: 12.5000 ug | PREFILLED_SYRINGE | INTRAMUSCULAR | Status: DC | PRN

## 2023-10-29 MED ORDER — ACETAMINOPHEN 325 MG PO TABS
650.0000 mg | ORAL_TABLET | Freq: Four times a day (QID) | ORAL | Status: DC | PRN
  Administered 2023-10-30: 650 mg via ORAL
  Filled 2023-10-29: qty 2

## 2023-10-29 NOTE — Progress Notes (Signed)
 BLE venous duplex has been completed.  Preliminary results given to Elpidio Anis, PA-C.   Results can be found under chart review under CV PROC. 10/29/2023 4:42 PM Latham Kinzler RVT, RDMS

## 2023-10-29 NOTE — ED Provider Notes (Cosign Needed Addendum)
 [place] EMERGENCY DEPARTMENT AT [facility] Provider Note   CSN: [id number] Arrival date & time: 10/29/23  1428     History  Chief Complaint  Patient presents with  . Groin Pain    Allison Chavez is a 83 y.o. female.  Patient to ED from home where she lives alone, with in-home nursing care x 24 hours. Sent here for concern for ?DVT in the ?right leg. History from contacts on the chart (Allison Chavez who is husband and POA but does not live with the patient, and Allison Chavez, family friend who is involved in her ongoing care). No reported fever. Has had a UTI recently. Patient with history of dementia, Alzheimers - confused at baseline.  The history is provided by the EMS personnel.  Groin Pain       Home Medications Prior to Admission medications   Medication Sig Start Date End Date Taking? Authorizing Provider  levothyroxine (SYNTHROID) 25 MCG tablet Take 1 tablet (25 mcg total) by mouth daily. 10/19/23   Shade Flood, MD  QUEtiapine (SEROQUEL) 25 MG tablet Take 1 tablet (25 mg total) by mouth at bedtime as needed. 10/16/23   Shade Flood, MD  thiamine 100 MG tablet Take 1 tablet (100 mg total) by mouth daily. 12/28/17   Albertine Grates, MD      Allergies    Ace inhibitors, Sulfa antibiotics, and Wellbutrin [bupropion]    Review of Systems   Review of Systems  Physical Exam Updated Vital Signs BP (!) 150/63   Pulse 78   Temp 98.6 F (37 C) (Oral)   Resp 16   SpO2 97%  Physical Exam Vitals and nursing note reviewed.  Constitutional:      General: She is not in acute distress. HENT:     Head: Atraumatic.     Mouth/Throat:     Mouth: Mucous membranes are moist.  Eyes:     Extraocular Movements: Extraocular movements intact.     Pupils: Pupils are equal, round, and reactive to light.  Cardiovascular:     Rate and Rhythm: Normal rate.  Pulmonary:     Effort: Pulmonary effort is normal.     Breath sounds: No wheezing or rales.     Comments: Poor  effort Chest:     Chest wall: No tenderness.  Abdominal:     General: There is no distension.     Palpations: Abdomen is soft.     Tenderness: There is no abdominal tenderness.  Musculoskeletal:     Cervical back: Normal range of motion.     Comments: No swelling of legs noted. Nontender, no discoloration aside from anterior shin bruising of ?age. No bony deformity. Moves extremities in full range on command.   Neurological:     Mental Status: She is alert.     Sensory: No sensory deficit.     Motor: No weakness.     Deep Tendon Reflexes: Reflexes normal.     Comments: Not oriented to place or time.      ED Results / Procedures / Treatments   Labs (all labs ordered are listed, but only abnormal results are displayed) Labs Reviewed  CBC WITH DIFFERENTIAL/PLATELET - Abnormal; Notable for the following components:      Result Value   RBC 3.16 (*)    Hemoglobin 9.6 (*)    HCT 31.1 (*)    RDW 18.9 (*)    Monocytes Absolute 1.1 (*)    Abs Immature Granulocytes 0.23 (*)  All other components within normal limits  COMPREHENSIVE METABOLIC PANEL WITH GFR - Abnormal; Notable for the following components:   Glucose, Bld 111 (*)    Creatinine, Ser 1.41 (*)    Albumin 3.4 (*)    AST 13 (*)    GFR, Estimated 38 (*)    All other components within normal limits  URINALYSIS, ROUTINE W REFLEX MICROSCOPIC - Abnormal; Notable for the following components:   Color, Urine STRAW (*)    Hgb urine dipstick MODERATE (*)    Bacteria, UA RARE (*)    All other components within normal limits  BASIC METABOLIC PANEL WITH GFR  CBC    EKG None  Radiology CT ABDOMEN PELVIS W CONTRAST Result Date: 10/29/2023 CLINICAL DATA:  Urinary tract infection, abdominal pain EXAM: CT ABDOMEN AND PELVIS WITH CONTRAST TECHNIQUE: Multidetector CT imaging of the abdomen and pelvis was performed using the standard protocol following bolus administration of intravenous contrast. RADIATION DOSE REDUCTION: This exam  was performed according to the departmental dose-optimization program which includes automated exposure control, adjustment of the mA and/or kV according to patient size and/or use of iterative reconstruction technique. CONTRAST:  80mL OMNIPAQUE IOHEXOL 300 MG/ML  SOLN COMPARISON:  02/23/2019, 07/19/2023, 09/19/2023 FINDINGS: Lower chest: Marked bibasilar pulmonary scarring and fibrosis. No acute airspace disease. Hepatobiliary: No focal liver abnormality is seen. No gallstones, gallbladder wall thickening, or biliary dilatation. Pancreas: Unremarkable. No pancreatic ductal dilatation or surrounding inflammatory changes. Spleen: Normal in size without focal abnormality. Adrenals/Urinary Tract: Adrenal glands are unremarkable. Kidneys are normal, without renal calculi, focal lesion, or hydronephrosis. Bladder is decompressed with a Foley catheter, limiting its evaluation. Stomach/Bowel: No bowel obstruction or ileus. Normal appendix right lower quadrant. Diffuse diverticulosis throughout the descending and sigmoid colon. No evidence of acute diverticulitis. No bowel wall thickening or inflammatory change. Vascular/Lymphatic: Aortic atherosclerosis. No enlarged abdominal or pelvic lymph nodes. Reproductive: Uterus and bilateral adnexa are unremarkable. Other: No free fluid or free intraperitoneal gas. No abdominal wall hernia. Musculoskeletal: Since the prior exams, a T10 wedge compression deformity has developed, with greater than 75% loss of height. No evidence of retropulsion. No other acute fractures. Unilateral left L5 spondylolysis without spondylolisthesis. Reconstructed images demonstrate no additional findings. IMPRESSION: 1. T10 wedge compression deformity with greater than 75% loss of height, age indeterminate but new since chest x-ray performed 09/19/2023. 2. Distal colonic diverticulosis without diverticulitis. 3.  Aortic Atherosclerosis (ICD10-I70.0). Electronically Signed   By: Sharlet Salina M.D.   On:  10/29/2023 19:48   VAS [place] LOWER EXTREMITY VENOUS (DVT) (ONLY [facility] & [facility]) Result Date: 10/29/2023  Lower Venous DVT Study Patient Name:  Allison Chavez  Date of Exam:   10/29/2023 Medical Rec #: [id number]     Accession #:    [id number] Date of Birth: [birth year]     Patient Gender: F Patient Age:   82 years Exam Location:  [facility] Procedure:      VAS [place] LOWER EXTREMITY VENOUS (DVT) Referring Phys: Melvenia Beam Yakir Wenke --------------------------------------------------------------------------------  Indications: Groin pain.  Limitations: Patient immobility. Comparison Study: No previous exams Performing Technologist: Jody Hill RVT, RDMS  Examination Guidelines: A complete evaluation includes B-mode imaging, spectral Doppler, color Doppler, and power Doppler as needed of all accessible portions of each vessel. Bilateral testing is considered an integral part of a complete examination. Limited examinations for reoccurring indications may be performed as noted. The reflux portion of the exam is performed with the patient in reverse Trendelenburg.  +--------+---------------+---------+-----------+----------+--------------------+ RIGHT  CompressibilityPhasicitySpontaneityPropertiesThrombus Aging       +--------+---------------+---------+-----------+----------+--------------------+ CFV     Full           Yes      Yes                                       +--------+---------------+---------+-----------+----------+--------------------+ SFJ     Full                                                              +--------+---------------+---------+-----------+----------+--------------------+ FV Prox Full           Yes      Yes                                       +--------+---------------+---------+-----------+----------+--------------------+ FV Mid  Full           Yes      Yes                                       +--------+---------------+---------+-----------+----------+--------------------+  FV      Full           Yes      Yes                                       Distal                                                                    +--------+---------------+---------+-----------+----------+--------------------+ PFV                    Yes      Yes                  patent by                                                                 color/doppler        +--------+---------------+---------+-----------+----------+--------------------+ POP     Full           Yes      Yes                                       +--------+---------------+---------+-----------+----------+--------------------+ PTV     Full                                                              +--------+---------------+---------+-----------+----------+--------------------+  PERO    Full                                                              +--------+---------------+---------+-----------+----------+--------------------+   +--------+---------------+---------+-----------+----------+--------------------+ LEFT    CompressibilityPhasicitySpontaneityPropertiesThrombus Aging       +--------+---------------+---------+-----------+----------+--------------------+ CFV     Full           Yes      Yes                                       +--------+---------------+---------+-----------+----------+--------------------+ SFJ     Full                                                              +--------+---------------+---------+-----------+----------+--------------------+ FV Prox Full           Yes      Yes                                       +--------+---------------+---------+-----------+----------+--------------------+ FV Mid  Full           Yes      Yes                                       +--------+---------------+---------+-----------+----------+--------------------+ FV      Full           Yes      Yes                                        Distal                                                                    +--------+---------------+---------+-----------+----------+--------------------+ PFV                    Yes      Yes                  patent by                                                                 color/doppler        +--------+---------------+---------+-----------+----------+--------------------+ POP     Full           Yes  Yes                                       +--------+---------------+---------+-----------+----------+--------------------+ PTV     Full                                                              +--------+---------------+---------+-----------+----------+--------------------+ PERO    Full                                                              +--------+---------------+---------+-----------+----------+--------------------+     Summary: BILATERAL: - No evidence of deep vein thrombosis seen in the lower extremities, bilaterally. -No evidence of popliteal cyst, bilaterally.   *See table(s) above for measurements and observations. Electronically signed by Lemar Livings MD on 10/29/2023 at 4:46:14 PM.    Final     Procedures Procedures    Medications Ordered in ED Medications  QUEtiapine (SEROQUEL) tablet 25 mg (has no administration in time range)  levothyroxine (SYNTHROID) tablet 25 mcg (has no administration in time range)  heparin injection 5,000 Units (5,000 Units Subcutaneous Given 10/29/23 2123)  acetaminophen (TYLENOL) tablet 650 mg (has no administration in time range)    Or  acetaminophen (TYLENOL) suppository 650 mg (has no administration in time range)  oxyCODONE (Oxy IR/ROXICODONE) immediate release tablet 2.5 mg (has no administration in time range)  fentaNYL (SUBLIMAZE) injection 12.5-50 mcg (has no administration in time range)  polyethylene glycol (MIRALAX / GLYCOLAX) packet 17 g (has no administration in time range)  bisacodyl  (DULCOLAX) EC tablet 5 mg (has no administration in time range)  methocarbamol (ROBAXIN) injection 500 mg (has no administration in time range)  ondansetron (ZOFRAN) tablet 4 mg (has no administration in time range)    Or  ondansetron (ZOFRAN) injection 4 mg (has no administration in time range)  lidocaine (LIDODERM) 5 % 1 patch (1 patch Transdermal Patch Applied 10/29/23 2125)  iohexol (OMNIPAQUE) 300 MG/ML solution 80 mL (80 mLs Intravenous Contrast Given 10/29/23 1855)    ED Course/ Medical Decision Making/ A&P Clinical Course as of 10/29/23 2132  Tue Oct 29, 2023  1530 Patient to ED for concern for DVT. Concern expressed by husband, who does not live with her but got report from in-home nurse, and from family friend. Unsure which leg is of concern. No swelling. No reported history of clots.  [SU]  1742 Bilateral DVT studies negative. No UTI on repeat UA (had UTI recently). Family friend (Allison Chavez) is at bedside and has been able to contact the in-home nurse by phone. Almyra Free tells me the issue has been that the patient screams with pain when nursing tries to change her pull up or clean her in the groin area. No redness or swelling of the area, no skin break down. She is essentially bed ridden so a low fall risk, however, I was told she did have a fall 2 weeks ago - when pain reportedly started - that was not felt to be the issue as she tried  to get up from bed and essentially slip on to the floor. As the patient cannot contribute to history, exam is unreliable (nontender at this time), new information is now available, will CT abd/pel to evaluate for intra-pelvic process/mass/fracture.  [SU]  2059 CT abd/pel shows a T10 wedge compression fracture of >75% loss of height. She is neurologically intact. Discussed with neurosurgery, PA Sarah, who advises TLSO brace, outpatient follow up if able to go home tonight. Almyra Free concerned about the lack of full time care in the home. I discussed the patient with  Dr. Antionette Char, [facility], who will admit her overnight and have PT involved tomorrow, possible coordination of care with neurosurgery, consideration of rehab placement based on input.  [SU]  2131 Almyra Free [phone] [SU]    Clinical Course User Index [SU] Elpidio Anis, PA-C                                 Medical Decision Making Amount and/or Complexity of Data Reviewed Labs: ordered. Radiology: ordered.  Risk Prescription drug management. Decision regarding hospitalization.           Final Clinical Impression(s) / ED Diagnoses Final diagnoses:  Compression fracture of T10 vertebra, initial encounter [facility])    Rx / DC Orders ED Discharge Orders     None         Elpidio Anis, PA-C 10/29/23 2109    Elpidio Anis, PA-C 10/29/23 2132    Derwood Kaplan, MD 10/30/23 1718

## 2023-10-29 NOTE — ED Triage Notes (Signed)
 Pt. BIB EMS for groin pain. Recently diagnosed with a uti. Family called ems with concerns of DVT. Pt. Has dementia at baseline.

## 2023-10-29 NOTE — ED Notes (Signed)
Pt coming from home

## 2023-10-29 NOTE — Progress Notes (Signed)
 Orthopedic Tech Progress Note Patient Details:  Allison Chavez Mar 06, 1941 [id number]  Patient ID: Allison Chavez, female   DOB: 1940/12/30, 83 y.o.   MRN: [id number]  Allison Chavez 10/29/2023, 8:41 PM [place] ordered from [facility] clinic

## 2023-10-29 NOTE — H&P (Signed)
 History and Physical    GINNI EICHLER ZOX:[id number] DOB: 1941-07-07 DOA: 10/29/2023  PCP: Shade Flood, MD   Patient coming from: Home   Chief Complaint: Pain   HPI: MEGEAN FABIO is a 83 y.o. female with medical history significant for dementia, anxiety, hypertension, hypothyroidism, and CKD 3B who presents with pain.  The patient had a fall at home 2 weeks ago. Since then, she has been screaming in pain when caregivers attempt to bathe her or change her clothes.  It has been difficult to determine the site of pain and the patient has difficulty providing history due to her dementia.   ED Course: Upon arrival to the ED, patient is found to be afebrile and saturating well on room air with normal HR and stable BP.  Labs are most notable for creatinine 1.41 and hemoglobin 9.6.  CT demonstrates T10 wedge compression deformity with >75% height loss.  ED PA discussed the case with neurosurgery TLSO brace was applied.  Review of Systems:  ROS limited by patient's clinical condition.  Past Medical History:  Diagnosis Date   Alcohol abuse 12/25/2017   Cataract    CKD (chronic kidney disease), stage III 12/25/2017   Degenerative retinal drusen of left eye 12/08/2019   Degenerative retinal drusen of right eye 12/08/2019   Dementia due to Alzheimer's disease 12/19/2021   DJD (degenerative joint disease) of cervical spine    DJD of shoulder, right    Generalized anxiety disorder    HTN (hypertension) 03/07/2012   Hyperlipidemia 01/11/2015   Hypokalemia 12/25/2017   Hyponatremia 12/25/2017   Insomnia 10/08/2013   Intermediate stage nonexudative age-related macular degeneration of both eyes 12/08/2019   Major depressive disorder    Osteopenia 01/11/2015   Updated DEXA 01/17/2015. Osteopenia. Stable since 11/24/2002.   Pneumonia    as a child   Pseudophakia of both eyes 12/08/2019   Renal insufficiency, mild    Senile entropion of right lower eyelid 06/07/2020   Sigmoid diverticulosis  01/08/2017   Noted on colonoscopy   Vitreous prolapse of left eye 12/08/2019    Past Surgical History:  Procedure Laterality Date   CLAVICLE SURGERY Right    [facility]   COLONOSCOPY     EYE SURGERY Bilateral    cataract surgery with lens implants   HARDWARE REMOVAL Left 12/09/2014   Procedure: LEFT SHOULDER HARDWARE REMOVAL;  Surgeon: Francena Hanly, MD;  Location: [facility] OR;  Service: Orthopedics;  Laterality: Left;   ORIF SHOULDER FRACTURE Left 09/23/2014   Procedure: OPEN REDUCTION INTERNAL FIXATION (ORIF) SHOULDER FRACTURE;  Surgeon: Senaida Lange, MD;  Location: [facility] OR;  Service: Orthopedics;  Laterality: Left;   TONSILLECTOMY     adenoids- childhood    Social History:   reports that she quit smoking about 21 years ago. Her smoking use included cigarettes. She started smoking about 66 years ago. She has a 13.5 pack-year smoking history. She has never used smokeless tobacco. She reports current alcohol use. She reports that she does not use drugs.  Allergies  Allergen Reactions   Ace Inhibitors Cough    Cough    Sulfa Antibiotics Hives   Wellbutrin [Bupropion] Rash    Only with generic, immediate release    Family History  Problem Relation Age of Onset   Cancer Father        Brain   Diverticulitis Father    Cancer Maternal Grandfather    Stroke Paternal Grandmother    Heart disease Paternal Grandfather  Heart attack Brother    Cancer Brother        colon   Diabetes Brother      Prior to Admission medications   Medication Sig Start Date End Date Taking? Authorizing Provider  levothyroxine (SYNTHROID) 25 MCG tablet Take 1 tablet (25 mcg total) by mouth daily. 10/19/23   Shade Flood, MD  QUEtiapine (SEROQUEL) 25 MG tablet Take 1 tablet (25 mg total) by mouth at bedtime as needed. 10/16/23   Shade Flood, MD  thiamine 100 MG tablet Take 1 tablet (100 mg total) by mouth daily. 12/28/17   Albertine Grates, MD    Physical Exam: Vitals:   10/29/23 1447 10/29/23 1850  BP:  (!) 131/53 (!) 150/63  Pulse: 74 78  Resp: 18 16  Temp: 98.4 F (36.9 C) 98.6 F (37 C)  TempSrc: Oral Oral  SpO2: 98% 97%    Constitutional: NAD, no pallor or diaphoresis  Eyes: PERTLA, lids and conjunctivae normal ENMT: Mucous membranes are moist. Posterior pharynx clear of any exudate or lesions.   Neck: supple, no masses  Respiratory: no wheezing, no crackles. No accessory muscle use.  Cardiovascular: S1 & S2 heard, regular rate and rhythm. No extremity edema.   Abdomen: No distension, no tenderness, soft. Bowel sounds active.  Musculoskeletal: no clubbing / cyanosis. No red/hot/swollen joint.   Skin: no significant rashes, lesions, ulcers. Warm, dry, well-perfused. Neurologic: CN 2-12 grossly intact. Moving all extremities. Alert and oriented to self only.  Psychiatric: Calm. Cooperative.    Labs and Imaging on Admission: I have personally reviewed following labs and imaging studies  CBC: Recent Labs  Lab 10/29/23 1538  WBC 6.3  NEUTROABS 2.6  HGB 9.6*  HCT 31.1*  MCV 98.4  PLT 209   Basic Metabolic Panel: Recent Labs  Lab 10/29/23 1538  NA 135  K 4.2  CL 104  CO2 22  GLUCOSE 111*  BUN 20  CREATININE 1.41*  CALCIUM 9.0   GFR: Estimated Creatinine Clearance: 28.6 mL/min (A) (by C-G formula based on SCr of 1.41 mg/dL (H)). Liver Function Tests: Recent Labs  Lab 10/29/23 1538  AST 13*  ALT 10  ALKPHOS 76  BILITOT 0.6  PROT 6.7  ALBUMIN 3.4*   No results for input(s): "LIPASE", "AMYLASE" in the last 168 hours. No results for input(s): "AMMONIA" in the last 168 hours. Coagulation Profile: No results for input(s): "INR", "PROTIME" in the last 168 hours. Cardiac Enzymes: No results for input(s): "CKTOTAL", "CKMB", "CKMBINDEX", "TROPONINI" in the last 168 hours. BNP (last 3 results) No results for input(s): "PROBNP" in the last 8760 hours. HbA1C: No results for input(s): "HGBA1C" in the last 72 hours. CBG: No results for input(s): "GLUCAP" in the  last 168 hours. Lipid Profile: No results for input(s): "CHOL", "HDL", "LDLCALC", "TRIG", "CHOLHDL", "LDLDIRECT" in the last 72 hours. Thyroid Function Tests: No results for input(s): "TSH", "T4TOTAL", "FREET4", "T3FREE", "THYROIDAB" in the last 72 hours. Anemia Panel: No results for input(s): "VITAMINB12", "FOLATE", "FERRITIN", "TIBC", "IRON", "RETICCTPCT" in the last 72 hours. Urine analysis:    Component Value Date/Time   COLORURINE STRAW (A) 10/29/2023 1649   APPEARANCEUR CLEAR 10/29/2023 1649   APPEARANCEUR Clear 09/26/2017 1341   LABSPEC 1.005 10/29/2023 1649   PHURINE 7.0 10/29/2023 1649   GLUCOSEU NEGATIVE 10/29/2023 1649   HGBUR MODERATE (A) 10/29/2023 1649   BILIRUBINUR NEGATIVE 10/29/2023 1649   BILIRUBINUR Negative 09/26/2017 1341   KETONESUR NEGATIVE 10/29/2023 1649   PROTEINUR NEGATIVE 10/29/2023 1649  UROBILINOGEN 0.2 01/22/2017 1544   UROBILINOGEN 0.2 07/28/2016 1913   NITRITE NEGATIVE 10/29/2023 1649   LEUKOCYTESUR NEGATIVE 10/29/2023 1649   Sepsis Labs: @LABRCNTIP (procalcitonin:4,lacticidven:4) )No results found for this or any previous visit (from the past 240 hours).   Radiological Exams on Admission: CT ABDOMEN PELVIS W CONTRAST Result Date: 10/29/2023 CLINICAL DATA:  Urinary tract infection, abdominal pain EXAM: CT ABDOMEN AND PELVIS WITH CONTRAST TECHNIQUE: Multidetector CT imaging of the abdomen and pelvis was performed using the standard protocol following bolus administration of intravenous contrast. RADIATION DOSE REDUCTION: This exam was performed according to the departmental dose-optimization program which includes automated exposure control, adjustment of the mA and/or kV according to patient size and/or use of iterative reconstruction technique. CONTRAST:  80mL OMNIPAQUE IOHEXOL 300 MG/ML  SOLN COMPARISON:  02/23/2019, 07/19/2023, 09/19/2023 FINDINGS: Lower chest: Marked bibasilar pulmonary scarring and fibrosis. No acute airspace disease.  Hepatobiliary: No focal liver abnormality is seen. No gallstones, gallbladder wall thickening, or biliary dilatation. Pancreas: Unremarkable. No pancreatic ductal dilatation or surrounding inflammatory changes. Spleen: Normal in size without focal abnormality. Adrenals/Urinary Tract: Adrenal glands are unremarkable. Kidneys are normal, without renal calculi, focal lesion, or hydronephrosis. Bladder is decompressed with a Foley catheter, limiting its evaluation. Stomach/Bowel: No bowel obstruction or ileus. Normal appendix right lower quadrant. Diffuse diverticulosis throughout the descending and sigmoid colon. No evidence of acute diverticulitis. No bowel wall thickening or inflammatory change. Vascular/Lymphatic: Aortic atherosclerosis. No enlarged abdominal or pelvic lymph nodes. Reproductive: Uterus and bilateral adnexa are unremarkable. Other: No free fluid or free intraperitoneal gas. No abdominal wall hernia. Musculoskeletal: Since the prior exams, a T10 wedge compression deformity has developed, with greater than 75% loss of height. No evidence of retropulsion. No other acute fractures. Unilateral left L5 spondylolysis without spondylolisthesis. Reconstructed images demonstrate no additional findings. IMPRESSION: 1. T10 wedge compression deformity with greater than 75% loss of height, age indeterminate but new since chest x-ray performed 09/19/2023. 2. Distal colonic diverticulosis without diverticulitis. 3.  Aortic Atherosclerosis (ICD10-I70.0). Electronically Signed   By: Sharlet Salina M.D.   On: 10/29/2023 19:48   VAS [place] LOWER EXTREMITY VENOUS (DVT) (ONLY [facility] & [facility]) Result Date: 10/29/2023  Lower Venous DVT Study Patient Name:  LIDYA MCCALISTER  Date of Exam:   10/29/2023 Medical Rec #: [id number]     Accession #:    [id number] Date of Birth: 10/31/1940     Patient Gender: F Patient Age:   59 years Exam Location:  [facility] Procedure:      VAS [place] LOWER EXTREMITY VENOUS (DVT) Referring Phys: Melvenia Beam  UPSTILL --------------------------------------------------------------------------------  Indications: Groin pain.  Limitations: Patient immobility. Comparison Study: No previous exams Performing Technologist: Jody Hill RVT, RDMS  Examination Guidelines: A complete evaluation includes B-mode imaging, spectral Doppler, color Doppler, and power Doppler as needed of all accessible portions of each vessel. Bilateral testing is considered an integral part of a complete examination. Limited examinations for reoccurring indications may be performed as noted. The reflux portion of the exam is performed with the patient in reverse Trendelenburg.  +--------+---------------+---------+-----------+----------+--------------------+ RIGHT   CompressibilityPhasicitySpontaneityPropertiesThrombus Aging       +--------+---------------+---------+-----------+----------+--------------------+ CFV     Full           Yes      Yes                                       +--------+---------------+---------+-----------+----------+--------------------+  SFJ     Full                                                              +--------+---------------+---------+-----------+----------+--------------------+ FV Prox Full           Yes      Yes                                       +--------+---------------+---------+-----------+----------+--------------------+ FV Mid  Full           Yes      Yes                                       +--------+---------------+---------+-----------+----------+--------------------+ FV      Full           Yes      Yes                                       Distal                                                                    +--------+---------------+---------+-----------+----------+--------------------+ PFV                    Yes      Yes                  patent by                                                                 color/doppler         +--------+---------------+---------+-----------+----------+--------------------+ POP     Full           Yes      Yes                                       +--------+---------------+---------+-----------+----------+--------------------+ PTV     Full                                                              +--------+---------------+---------+-----------+----------+--------------------+ PERO    Full                                                              +--------+---------------+---------+-----------+----------+--------------------+   +--------+---------------+---------+-----------+----------+--------------------+  LEFT    CompressibilityPhasicitySpontaneityPropertiesThrombus Aging       +--------+---------------+---------+-----------+----------+--------------------+ CFV     Full           Yes      Yes                                       +--------+---------------+---------+-----------+----------+--------------------+ SFJ     Full                                                              +--------+---------------+---------+-----------+----------+--------------------+ FV Prox Full           Yes      Yes                                       +--------+---------------+---------+-----------+----------+--------------------+ FV Mid  Full           Yes      Yes                                       +--------+---------------+---------+-----------+----------+--------------------+ FV      Full           Yes      Yes                                       Distal                                                                    +--------+---------------+---------+-----------+----------+--------------------+ PFV                    Yes      Yes                  patent by                                                                 color/doppler        +--------+---------------+---------+-----------+----------+--------------------+  POP     Full           Yes      Yes                                       +--------+---------------+---------+-----------+----------+--------------------+ PTV     Full                                                              +--------+---------------+---------+-----------+----------+--------------------+  PERO    Full                                                              +--------+---------------+---------+-----------+----------+--------------------+     Summary: BILATERAL: - No evidence of deep vein thrombosis seen in the lower extremities, bilaterally. -No evidence of popliteal cyst, bilaterally.   *See table(s) above for measurements and observations. Electronically signed by Lemar Livings MD on 10/29/2023 at 4:46:14 PM.    Final     Assessment/Plan   1. T10 compression fracture  - Patient has been screaming in pain whenever caregivers move her ever since a fall 2 wks ago; she is found to have T10 compression fracture   - Neurosurgery recommended TLSO brace and outpatient follow-up but patient's friend is concerned that the patient will not have anyone to care for her at home  - Continue TLSO brace and pain-control, consult PT and TOC   2. Dementia  - Delirium precautions    3. Anxiety  - Seroquel    4. Hypothyroidism  - Synthroid    5. Anemia  - Appears stable, no overt bleeding    6. CKD 3B  - Appears close to baseline  - Renally-dose medications     DVT prophylaxis: sq heparin  Code Status: Full   Level of Care: Level of care: Med-Surg Family Communication: Friend at bedside   Disposition Plan:  Patient is from: Home  Anticipated d/c is to: TBD Anticipated d/c date is: 10/30/23  Patient currently: Pending PT eval, pain-control, disposition planning  Consults called: None  Admission status: Observation     Briscoe Deutscher, MD [facility]  10/29/2023, 9:07 PM

## 2023-10-29 NOTE — Telephone Encounter (Signed)
Placed in your sign folder at the nurse station

## 2023-10-30 DIAGNOSIS — S22000A Wedge compression fracture of unspecified thoracic vertebra, initial encounter for closed fracture: Secondary | ICD-10-CM | POA: Diagnosis not present

## 2023-10-30 LAB — CBC
HCT: 31.2 % — ABNORMAL LOW (ref 36.0–46.0)
Hemoglobin: 9.8 g/dL — ABNORMAL LOW (ref 12.0–15.0)
MCH: 30.7 pg (ref 26.0–34.0)
MCHC: 31.4 g/dL (ref 30.0–36.0)
MCV: 97.8 fL (ref 80.0–100.0)
Platelets: 219 10*3/uL (ref 150–400)
RBC: 3.19 MIL/uL — ABNORMAL LOW (ref 3.87–5.11)
RDW: 18.6 % — ABNORMAL HIGH (ref 11.5–15.5)
WBC: 9.5 10*3/uL (ref 4.0–10.5)
nRBC: 0 % (ref 0.0–0.2)

## 2023-10-30 LAB — BASIC METABOLIC PANEL WITH GFR
Anion gap: 9 (ref 5–15)
BUN: 19 mg/dL (ref 8–23)
CO2: 21 mmol/L — ABNORMAL LOW (ref 22–32)
Calcium: 8.9 mg/dL (ref 8.9–10.3)
Chloride: 105 mmol/L (ref 98–111)
Creatinine, Ser: 1.41 mg/dL — ABNORMAL HIGH (ref 0.44–1.00)
GFR, Estimated: 38 mL/min — ABNORMAL LOW (ref >60)
Glucose, Bld: 103 mg/dL — ABNORMAL HIGH (ref 70–99)
Potassium: 3.9 mmol/L (ref 3.5–5.1)
Sodium: 135 mmol/L (ref 135–145)

## 2023-10-30 MED ORDER — CHLORHEXIDINE GLUCONATE CLOTH 2 % EX PADS
6.0000 | MEDICATED_PAD | Freq: Every day | CUTANEOUS | Status: DC
  Administered 2023-10-31 – 2023-11-01 (×2): 6 via TOPICAL

## 2023-10-30 MED ORDER — ACETAMINOPHEN 500 MG PO TABS
1000.0000 mg | ORAL_TABLET | Freq: Three times a day (TID) | ORAL | Status: DC
  Administered 2023-10-30 – 2023-11-01 (×8): 1000 mg via ORAL
  Filled 2023-10-30 (×8): qty 2

## 2023-10-30 NOTE — Care Management Obs Status (Signed)
 MEDICARE OBSERVATION STATUS NOTIFICATION   Patient Details  Name: Allison Chavez MRN: [id number] Date of Birth: 04-09-1941   Medicare Observation Status Notification Given:  Yes    Amada Jupiter, LCSW 10/30/2023, 3:45 PM

## 2023-10-30 NOTE — Telephone Encounter (Signed)
 Paperwork completed and placed in fax bin at back nurse station

## 2023-10-30 NOTE — Progress Notes (Signed)
 PROGRESS NOTE    Allison Chavez  UJW:[id number] DOB: 09/19/1940 DOA: 10/29/2023 PCP: Shade Flood, MD    Brief Narrative:  Allison Chavez is a 83 y.o. female with medical history significant for dementia, anxiety, hypertension, hypothyroidism, and CKD 3B who presents with pain.   The patient had a fall at home 2 weeks ago. Since then, she has been screaming in pain when caregivers attempt to bathe her or change her clothes.  It has been difficult to determine the site of pain and the patient has difficulty providing history due to her dementia.    Assessment and Plan: T10 compression fracture  - Patient has been screaming in pain whenever caregivers move her ever since a fall 2 wks ago; she is found to have T10 compression fracture   - Neurosurgery recommended TLSO brace and outpatient follow-up but patient's friend is concerned that the patient will not have anyone to care for her at home  - Continue TLSO brace and pain-control, consult PT and TOC  -schedule tylenol for now for pain and PRN oxy    Dementia  - Delirium precautions   -unclear baseline    Anxiety  - Seroquel     Hypothyroidism  - Synthroid      CKD 3B  - Appears close to baseline  - Renally-dose medications      DVT prophylaxis: heparin injection 5,000 Units Start: 10/29/23 2200    Code Status: Full Code Family Communication: none at bedside  Disposition Plan:  Level of care: Med-Surg Status is: Observation     Consultants:  PT   Subjective: Says 8;30 in the morning is too early to be bothered  Objective: Vitals:   10/29/23 2217 10/29/23 2235 10/30/23 0147 10/30/23 0545  BP:  (!) 174/84 (!) 162/75 (!) 156/82  Pulse:  88 82 80  Resp:  17 18 15   Temp:  98.1 F (36.7 C) (!) 97.4 F (36.3 C) 97.7 F (36.5 C)  TempSrc:  Oral Oral Oral  SpO2:  95% 97% 99%  Weight: 55.6 kg     Height: 5\' 2"  (1.575 m)       Intake/Output Summary (Last 24 hours) at 10/30/2023 5621 Last data filed at 10/30/2023  0147 Gross per 24 hour  Intake --  Output 150 ml  Net -150 ml   Filed Weights   10/29/23 2217  Weight: 55.6 kg    Examination:    General: Appearance:    elderly female in no acute distress     Lungs:      respirations unlabored  Heart:    Normal heart rate. Normal rhythm. No murmurs, rubs, or gallops.   MS:   All extremities are intact.   Neurologic:   Awake, alert      Data Reviewed: I have personally reviewed following labs and imaging studies  CBC: Recent Labs  Lab 10/29/23 1538 10/30/23 0313  WBC 6.3 9.5  NEUTROABS 2.6  --   HGB 9.6* 9.8*  HCT 31.1* 31.2*  MCV 98.4 97.8  PLT 209 219   Basic Metabolic Panel: Recent Labs  Lab 10/29/23 1538 10/30/23 0313  NA 135 135  K 4.2 3.9  CL 104 105  CO2 22 21*  GLUCOSE 111* 103*  BUN 20 19  CREATININE 1.41* 1.41*  CALCIUM 9.0 8.9   GFR: Estimated Creatinine Clearance: 25.2 mL/min (A) (by C-G formula based on SCr of 1.41 mg/dL (H)). Liver Function Tests: Recent Labs  Lab 10/29/23 1538  AST 13*  ALT 10  ALKPHOS 76  BILITOT 0.6  PROT 6.7  ALBUMIN 3.4*   No results for input(s): "LIPASE", "AMYLASE" in the last 168 hours. No results for input(s): "AMMONIA" in the last 168 hours. Coagulation Profile: No results for input(s): "INR", "PROTIME" in the last 168 hours. Cardiac Enzymes: No results for input(s): "CKTOTAL", "CKMB", "CKMBINDEX", "TROPONINI" in the last 168 hours. BNP (last 3 results) No results for input(s): "PROBNP" in the last 8760 hours. HbA1C: No results for input(s): "HGBA1C" in the last 72 hours. CBG: No results for input(s): "GLUCAP" in the last 168 hours. Lipid Profile: No results for input(s): "CHOL", "HDL", "LDLCALC", "TRIG", "CHOLHDL", "LDLDIRECT" in the last 72 hours. Thyroid Function Tests: No results for input(s): "TSH", "T4TOTAL", "FREET4", "T3FREE", "THYROIDAB" in the last 72 hours. Anemia Panel: No results for input(s): "VITAMINB12", "FOLATE", "FERRITIN", "TIBC", "IRON",  "RETICCTPCT" in the last 72 hours. Sepsis Labs: No results for input(s): "PROCALCITON", "LATICACIDVEN" in the last 168 hours.  No results found for this or any previous visit (from the past 240 hours).       Radiology Studies: CT ABDOMEN PELVIS W CONTRAST Result Date: 10/29/2023 CLINICAL DATA:  Urinary tract infection, abdominal pain EXAM: CT ABDOMEN AND PELVIS WITH CONTRAST TECHNIQUE: Multidetector CT imaging of the abdomen and pelvis was performed using the standard protocol following bolus administration of intravenous contrast. RADIATION DOSE REDUCTION: This exam was performed according to the departmental dose-optimization program which includes automated exposure control, adjustment of the mA and/or kV according to patient size and/or use of iterative reconstruction technique. CONTRAST:  80mL OMNIPAQUE IOHEXOL 300 MG/ML  SOLN COMPARISON:  02/23/2019, 07/19/2023, 09/19/2023 FINDINGS: Lower chest: Marked bibasilar pulmonary scarring and fibrosis. No acute airspace disease. Hepatobiliary: No focal liver abnormality is seen. No gallstones, gallbladder wall thickening, or biliary dilatation. Pancreas: Unremarkable. No pancreatic ductal dilatation or surrounding inflammatory changes. Spleen: Normal in size without focal abnormality. Adrenals/Urinary Tract: Adrenal glands are unremarkable. Kidneys are normal, without renal calculi, focal lesion, or hydronephrosis. Bladder is decompressed with a Foley catheter, limiting its evaluation. Stomach/Bowel: No bowel obstruction or ileus. Normal appendix right lower quadrant. Diffuse diverticulosis throughout the descending and sigmoid colon. No evidence of acute diverticulitis. No bowel wall thickening or inflammatory change. Vascular/Lymphatic: Aortic atherosclerosis. No enlarged abdominal or pelvic lymph nodes. Reproductive: Uterus and bilateral adnexa are unremarkable. Other: No free fluid or free intraperitoneal gas. No abdominal wall hernia. Musculoskeletal:  Since the prior exams, a T10 wedge compression deformity has developed, with greater than 75% loss of height. No evidence of retropulsion. No other acute fractures. Unilateral left L5 spondylolysis without spondylolisthesis. Reconstructed images demonstrate no additional findings. IMPRESSION: 1. T10 wedge compression deformity with greater than 75% loss of height, age indeterminate but new since chest x-ray performed 09/19/2023. 2. Distal colonic diverticulosis without diverticulitis. 3.  Aortic Atherosclerosis (ICD10-I70.0). Electronically Signed   By: Sharlet Salina M.D.   On: 10/29/2023 19:48   VAS [place] LOWER EXTREMITY VENOUS (DVT) (ONLY [facility] & [facility]) Result Date: 10/29/2023  Lower Venous DVT Study Patient Name:  LIORA MYLES  Date of Exam:   10/29/2023 Medical Rec #: [id number]     Accession #:    [id number] Date of Birth: 09/26/1940     Patient Gender: F Patient Age:   28 years Exam Location:  [facility] Procedure:      VAS [place] LOWER EXTREMITY VENOUS (DVT) Referring Phys: Melvenia Beam UPSTILL --------------------------------------------------------------------------------  Indications: Groin pain.  Limitations: Patient immobility. Comparison Study: No previous exams Performing  Technologist: Ernestene Mention RVT, RDMS  Examination Guidelines: A complete evaluation includes B-mode imaging, spectral Doppler, color Doppler, and power Doppler as needed of all accessible portions of each vessel. Bilateral testing is considered an integral part of a complete examination. Limited examinations for reoccurring indications may be performed as noted. The reflux portion of the exam is performed with the patient in reverse Trendelenburg.  +--------+---------------+---------+-----------+----------+--------------------+ RIGHT   CompressibilityPhasicitySpontaneityPropertiesThrombus Aging       +--------+---------------+---------+-----------+----------+--------------------+ CFV     Full           Yes      Yes                                        +--------+---------------+---------+-----------+----------+--------------------+ SFJ     Full                                                              +--------+---------------+---------+-----------+----------+--------------------+ FV Prox Full           Yes      Yes                                       +--------+---------------+---------+-----------+----------+--------------------+ FV Mid  Full           Yes      Yes                                       +--------+---------------+---------+-----------+----------+--------------------+ FV      Full           Yes      Yes                                       Distal                                                                    +--------+---------------+---------+-----------+----------+--------------------+ PFV                    Yes      Yes                  patent by                                                                 color/doppler        +--------+---------------+---------+-----------+----------+--------------------+ POP     Full           Yes  Yes                                       +--------+---------------+---------+-----------+----------+--------------------+ PTV     Full                                                              +--------+---------------+---------+-----------+----------+--------------------+ PERO    Full                                                              +--------+---------------+---------+-----------+----------+--------------------+   +--------+---------------+---------+-----------+----------+--------------------+ LEFT    CompressibilityPhasicitySpontaneityPropertiesThrombus Aging       +--------+---------------+---------+-----------+----------+--------------------+ CFV     Full           Yes      Yes                                        +--------+---------------+---------+-----------+----------+--------------------+ SFJ     Full                                                              +--------+---------------+---------+-----------+----------+--------------------+ FV Prox Full           Yes      Yes                                       +--------+---------------+---------+-----------+----------+--------------------+ FV Mid  Full           Yes      Yes                                       +--------+---------------+---------+-----------+----------+--------------------+ FV      Full           Yes      Yes                                       Distal                                                                    +--------+---------------+---------+-----------+----------+--------------------+ PFV                    Yes      Yes  patent by                                                                 color/doppler        +--------+---------------+---------+-----------+----------+--------------------+ POP     Full           Yes      Yes                                       +--------+---------------+---------+-----------+----------+--------------------+ PTV     Full                                                              +--------+---------------+---------+-----------+----------+--------------------+ PERO    Full                                                              +--------+---------------+---------+-----------+----------+--------------------+     Summary: BILATERAL: - No evidence of deep vein thrombosis seen in the lower extremities, bilaterally. -No evidence of popliteal cyst, bilaterally.   *See table(s) above for measurements and observations. Electronically signed by Lemar Livings MD on 10/29/2023 at 4:46:14 PM.    Final         Scheduled Meds:  acetaminophen  1,000 mg Oral TID   heparin  5,000 Units Subcutaneous Q8H   levothyroxine   25 mcg Oral Daily   lidocaine  1 patch Transdermal Q24H   Continuous Infusions:   LOS: 0 days    Time spent: 45 minutes spent on chart review, discussion with nursing staff, consultants, updating family and interview/physical exam; more than 50% of that time was spent in counseling and/or coordination of care.    Joseph Art, DO [facility] Available via Epic secure chat 7am-7pm After these hours, please refer to coverage provider listed on [email or web address] 10/30/2023, 8:28 AM

## 2023-10-30 NOTE — Plan of Care (Signed)
   Problem: Activity: Goal: Risk for activity intolerance will decrease Outcome: Progressing   Problem: Pain Managment: Goal: General experience of comfort will improve and/or be controlled Outcome: Progressing   Problem: Safety: Goal: Ability to remain free from injury will improve Outcome: Progressing

## 2023-10-30 NOTE — Evaluation (Signed)
 Physical Therapy Evaluation Patient Details Name: Allison Chavez MRN: [id number] DOB: 08-11-1940 Today's Date: 10/30/2023  History of Present Illness  Pt is 83 yo female presenting from home on 10/29/23 with increased pain since fall 2 weeks ago.  Was difficulty determining site of pain due to dementia.  Pt found to have T10 compression fx with neurosurgery recommending management with TLSO. Pt with other hx including recent hospitalization, multiple recent ED presentations, dementia, anxiety, HTN, hypothyroidism, and CKD.  Clinical Impression  Pt admitted with above diagnosis. Pt poor historian and unable to provide PLOF.  At last admission she was at least taking a few steps with assist.  One are in chart states pt has 24 hr care but another states "Almyra Free" concerned b/c pt doesn't have 24 hr care.  Today, pt requiring mod A of 2 for safety with transfers and to perform step pivot to chair with R HHA.  She is requiring more assist than last visit.  Also, noted L hand with significant contracture of all fingers (last PT note only documented digits 3 and 4 contracted).  3rd digit unable to be extended to observe palm at all  Potential for skin breakdown - recommend OT consult for L hand assessment.   Pt currently with functional limitations due to the deficits listed below (see PT Problem List). Pt will benefit from acute skilled PT to increase their independence and safety with mobility to allow discharge.  Patient will benefit from continued inpatient follow up therapy, <3 hours/day at d/c.          If plan is discharge home, recommend the following: A lot of help with walking and/or transfers;A lot of help with bathing/dressing/bathroom   Can travel by private vehicle   No    Equipment Recommendations Wheelchair cushion (measurements PT);Wheelchair (measurements PT);BSC/3in1  Recommendations for Other Services  OT consult    Functional Status Assessment Patient has had a recent decline in their  functional status and demonstrates the ability to make significant improvements in function in a reasonable and predictable amount of time.     Precautions / Restrictions Precautions Precautions: Fall;Back Precaution Booklet Issued: Yes (comment) Recall of Precautions/Restrictions: Impaired Required Braces or Orthoses: Spinal Brace Spinal Brace: Thoracolumbosacral orthotic;Applied in sitting position (may remove to shower)      Mobility  Bed Mobility Overal bed mobility: Needs Assistance Bed Mobility: Rolling, Sidelying to Sit Rolling: Mod assist, +2 for physical assistance Sidelying to sit: Mod assist, +2 for physical assistance       General bed mobility comments: Pt moaned in pain during transfer but once sitting no pain    Transfers Overall transfer level: Needs assistance Equipment used: 1 person hand held assist Transfers: Sit to/from Stand, Bed to chair/wheelchair/BSC Sit to Stand: Mod assist, +2 safety/equipment   Step pivot transfers: Mod assist, +2 safety/equipment       General transfer comment: STS x 4 during session with HHA on R and mod A to rise.  Pt had BM - PT tech assisted with cleaning.  Pt then performed step pivot to recliner with HHA on R and mod A of 2 for safety    Ambulation/Gait                  Stairs            Wheelchair Mobility     Tilt Bed    Modified Rankin (Stroke Patients Only)       Balance Overall balance assessment: Needs assistance  Sitting-balance support: No upper extremity supported Sitting balance-Leahy Scale: Fair     Standing balance support: Single extremity supported Standing balance-Leahy Scale: Poor                               Pertinent Vitals/Pain Pain Assessment Pain Assessment: Faces Faces Pain Scale: Hurts even more Pain Location: "all over" with movement; no appearance of pain at rest Pain Descriptors / Indicators: Grimacing, Guarding Pain Intervention(s): Limited  activity within patient's tolerance, Monitored during session, Premedicated before session, Repositioned    Home Living Family/patient expects to be discharged to:: Private residence                   Additional Comments: Pt is poor historian.  Per PT notes last visit and current notes in chart, pt was at home.  Her spouse is POA but does not live with her.  Per chart has live-in caregiver "Almyra Free".  One report of 24 hr assist but then another that "Almyra Free" is concerned about lack of full time care at home.    Prior Function Prior Level of Function : Needs assist             Mobility Comments: Pt unable to provide.  Per chart largely bedridden but at last admission (~1 month ago) she was walking some ADLs Comments: Assist for all ADLs     Extremity/Trunk Assessment   Upper Extremity Assessment Upper Extremity Assessment: LUE deficits/detail;RUE deficits/detail;Difficult to assess due to impaired cognition RUE Deficits / Details: ROM grossly WFL; unable to follow MMT commands (at least 3/5) LUE Deficits / Details: Tends to keep L elbow flexed and hand is contracted.  She was able to partially straighten elbow (lacking ~30 degrees ext) .  All L fingers in severe flexion contracture.  PT was partially able to lift digits 2,4,5 but unable to lift digit 3 at all.  Could benefit from OT assessment for possible splint/brace    Lower Extremity Assessment Lower Extremity Assessment: Generalized weakness;Difficult to assess due to impaired cognition (stiff initial movements but then ROM WFL)    Cervical / Trunk Assessment Cervical / Trunk Assessment: Kyphotic;Other exceptions Cervical / Trunk Exceptions: new T10 compression fx with TLSO in place  Communication   Communication Communication: No apparent difficulties    Cognition Arousal: Alert Behavior During Therapy: WFL for tasks assessed/performed   PT - Cognitive impairments: History of cognitive impairments, Orientation,  Awareness, Memory, Attention, Initiation, Sequencing, Problem solving, Safety/Judgement                       PT - Cognition Comments: alert and oriented to self and place only; unable to provide history Following commands: Impaired Following commands impaired: Only follows one step commands consistently     Cueing Cueing Techniques: Verbal cues, Visual cues, Tactile cues, Gestural cues     General Comments General comments (skin integrity, edema, etc.): Question skin integritiy L hand due to finger contracture but unable to open to see - will place OT order for L hand assessment.  Tried to place wash cloth but unable    Exercises     Assessment/Plan    PT Assessment Patient needs continued PT services  PT Problem List Decreased strength;Decreased range of motion;Decreased activity tolerance;Decreased balance;Decreased mobility;Pain;Decreased cognition;Decreased knowledge of use of DME;Decreased safety awareness       PT Treatment Interventions DME instruction;Therapeutic exercise;Gait training;Functional mobility training;Therapeutic activities;Patient/family education;Wheelchair  mobility training;Balance training;Modalities    PT Goals (Current goals can be found in the Care Plan section)  Acute Rehab PT Goals Patient Stated Goal: unable to state PT Goal Formulation: Patient unable to participate in goal setting Time For Goal Achievement: 11/13/23 Potential to Achieve Goals: Fair    Frequency Min 2X/week     Co-evaluation               AM-PAC PT "6 Clicks" Mobility  Outcome Measure Help needed turning from your back to your side while in a flat bed without using bedrails?: A Lot Help needed moving from lying on your back to sitting on the side of a flat bed without using bedrails?: Total Help needed moving to and from a bed to a chair (including a wheelchair)?: Total Help needed standing up from a chair using your arms (e.g., wheelchair or bedside chair)?:  Total Help needed to walk in hospital room?: Total Help needed climbing 3-5 steps with a railing? : Total 6 Click Score: 7    End of Session Equipment Utilized During Treatment: Gait belt;Back brace Activity Tolerance: Patient limited by pain Patient left: with chair alarm set;in chair;with call bell/phone within reach Nurse Communication: Mobility status PT Visit Diagnosis: Other abnormalities of gait and mobility (R26.89);Muscle weakness (generalized) (M62.81)    Time: 1610-9604 PT Time Calculation (min) (ACUTE ONLY): 27 min   Charges:   PT Evaluation $PT Eval Low Complexity: 1 Low PT Treatments $Therapeutic Activity: 8-22 mins PT General Charges $$ ACUTE PT VISIT: 1 Visit         Anise Salvo, PT Acute Rehab [facility] [phone]   Rayetta Humphrey 10/30/2023, 3:41 PM

## 2023-10-31 DIAGNOSIS — S22000A Wedge compression fracture of unspecified thoracic vertebra, initial encounter for closed fracture: Secondary | ICD-10-CM | POA: Diagnosis not present

## 2023-10-31 NOTE — Plan of Care (Signed)

## 2023-10-31 NOTE — Evaluation (Signed)
 Occupational Therapy Evaluation Patient Details Name: Allison Chavez MRN: [id number] DOB: [birth year] Today's Date: 10/31/2023   History of Present Illness   Pt is 83 yr old female presenting from home on 10/29/23 with increased pain since fall 2 weeks ago. Pt found to have T10 compression fx with neurosurgery recommending management with TLSO. Pt with other hx including recent hospitalization, dementia, anxiety, HTN, hypothyroidism, and CKD.     Clinical Impressions Pt is currently with deficits as listed below (see OT problem list), which compromises her ADL performance. She currently requires significant assist for tasks, including dressing, bed mobility, toileting, and sit to stand. During the session today, she was noted to have fair sitting balance, poor standing balance, impaired memory, generalized weakness, and suspected flexion contractures of digits 2-5 of her L hand. She will benefit from further OT services to maximize her ADL performance and to decrease the risk for progressive weakness. Patient will benefit from continued inpatient follow up therapy, <3 hours/day.       If plan is discharge home, recommend the following:   A lot of help with walking and/or transfers;A lot of help with bathing/dressing/bathroom;Direct supervision/assist for medications management;Supervision due to cognitive status;Direct supervision/assist for financial management;Assist for transportation;Assistance with cooking/housework;Help with stairs or ramp for entrance     Functional Status Assessment   Patient has had a recent decline in their functional status and demonstrates the ability to make significant improvements in function in a reasonable and predictable amount of time.     Equipment Recommendations   Other (comment) (defer to next level of care)     Recommendations for Other Services         Precautions/Restrictions   Precautions Precautions: Fall;Back Recall of  Precautions/Restrictions: Impaired Required Braces or Orthoses: Spinal Brace Spinal Brace: Thoracolumbosacral orthotic;Applied in sitting position     Mobility Bed Mobility Overal bed mobility: Needs Assistance Bed Mobility: Rolling, Sidelying to Sit, Sit to Supine Rolling: Max assist Sidelying to sit: Max assist   Sit to supine: Max assist        Transfers       Sit to Stand: From elevated surface, Mod assist       Balance     Sitting balance-Leahy Scale: Fair       Standing balance-Leahy Scale: Poor             ADL either performed or assessed with clinical judgement   ADL Overall ADL's : Needs assistance/impaired Eating/Feeding: Minimal assistance;Sitting Eating/Feeding Details (indicate cue type and reason): in supported sitting/chair Grooming: Moderate assistance Grooming Details (indicate cue type and reason): in supported sitting/chair         Upper Body Dressing : Maximal assistance;Sitting   Lower Body Dressing: Total assistance Lower Body Dressing Details (indicate cue type and reason): for sock management seated EOB     Toileting- Clothing Manipulation and Hygiene: Total assistance;Bed level   Tub/ Shower Transfer: Total assistance;Stand-pivot                        Pertinent Vitals/Pain Pain Assessment Pain Assessment: No/denies pain     Extremity/Trunk Assessment Upper Extremity Assessment Upper Extremity Assessment: Right hand dominant;LUE deficits/detail;RUE deficits/detail RUE Deficits / Details: AROM WFL. Functional grip strength LUE Deficits / Details: Chronic shoulder AROM limitation with her spouse reporting prior collar bone fracture. Elbow AROM WFL. Fully closed fist observed, with suspected contractures of digits 2-5.   Lower Extremity Assessment Lower Extremity Assessment: Generalized weakness;Difficult  to assess due to impaired cognition       Communication     Cognition Arousal: Alert Behavior During  Therapy: WFL for tasks assessed/performed Cognition: History of cognitive impairments, Cognition impaired   Orientation impairments: Time, Situation, Place   Memory impairment (select all impairments): Short-term memory   Executive functioning impairment (select all impairments): Problem solving        Following commands: Impaired Following commands impaired: Follows one step commands with increased time and moderate repetition of prompts     Cueing  General Comments   Cueing Techniques: Verbal cues;Tactile cues              Home Living Family/patient expects to be discharged to:: Private residence Living Arrangements: Other (Comment) (family friend and caregivers) Available Help at Discharge: Family;Personal care attendant Type of Home: House Home Access: Stairs to enter Secretary/administrator of Steps: 2+1   Home Layout: One level     Bathroom Shower/Tub: Tub/shower unit         Home Equipment: Control and instrumentation engineer (2 wheels);Wheelchair - manual   Additional Comments: Pt was an unreliable historian, therefore info here was obtained from pt's spouse.      Prior Functioning/Environment Prior Level of Function : Needs assist             Mobility Comments:  (Per spouse, pt has been requiring CGA- min assist for short distance ambulation in the home without an AD. ADLs Comments:  (Per spouse, pt required supervision to min assist for feeding, and max assist for bathing, dressing, and toileting. She has a live-in family friend who provides care, as well as multiple other caregivers who assist daily.)    OT Problem List: Decreased strength;Decreased range of motion;Decreased activity tolerance;Impaired balance (sitting and/or standing);Decreased coordination;Decreased knowledge of use of DME or AE;Decreased knowledge of precautions;Impaired UE functional use   OT Treatment/Interventions: Self-care/ADL training;Therapeutic exercise;Energy  conservation;DME and/or AE instruction;Patient/family education;Balance training;Cognitive remediation/compensation;Therapeutic activities      OT Goals(Current goals can be found in the care plan section)   Acute Rehab OT Goals OT Goal Formulation: With family Time For Goal Achievement: 11/14/23 Potential to Achieve Goals: Fair ADL Goals Pt Will Perform Grooming: with min assist;sitting Pt Will Perform Upper Body Dressing: sitting;with mod assist Pt Will Transfer to Toilet: with min assist;bedside commode;stand pivot transfer Additional ADL Goal #1: Pt will perform bed mobility with min assist, in prep for out of bed ADL participation.   OT Frequency:  Min 2X/week       AM-PAC OT "6 Clicks" Daily Activity     Outcome Measure Help from another person eating meals?: A Little Help from another person taking care of personal grooming?: A Lot Help from another person toileting, which includes using toliet, bedpan, or urinal?: Total Help from another person bathing (including washing, rinsing, drying)?: A Lot Help from another person to put on and taking off regular upper body clothing?: A Lot Help from another person to put on and taking off regular lower body clothing?: Total 6 Click Score: 11   End of Session Equipment Utilized During Treatment: Gait belt Nurse Communication: Mobility status  Activity Tolerance: Other (comment) (Fair tolerance) Patient left: in bed;with call bell/phone within reach;with bed alarm set;with family/visitor present  OT Visit Diagnosis: Unsteadiness on feet (R26.81);Muscle weakness (generalized) (M62.81);Other abnormalities of gait and mobility (R26.89)                Time: 1610-9604 OT Time Calculation (min): 24 min Charges:  OT General Charges $OT Visit: 1 Visit OT Evaluation $OT Eval Moderate Complexity: 1 Mod    Olive Motyka L Damin Salido, OTR/L 10/31/2023, 5:41 PM

## 2023-10-31 NOTE — NC FL2 (Signed)
 [place] MEDICAID [facility] LEVEL OF CARE FORM     IDENTIFICATION  Patient Name: Allison Chavez Birthdate: [birth year] Sex: female Admission Date (Current Location): 10/29/2023  [facility] and [place] Number:  Producer, television/film/video and Address:  [facility],  [address] [place], [place] [address]      Provider Number: [id number]  Attending Physician Name and Address:  Joseph Art, DO  Relative Name and Phone Number:  spouse, Asmaa Tirpak @ [phone]    Current Level of Care: Hospital Recommended Level of Care: Skilled Nursing Facility Prior Approval Number:    Date Approved/Denied:   PASRR Number: [id number]  Discharge Plan: SNF    Current Diagnoses: Patient Active Problem List   Diagnosis Date Noted   Thoracic compression fracture, closed, initial encounter (HCC) 10/29/2023   Hypothyroidism 10/29/2023   FTT (failure to thrive) in adult 09/19/2023   Anemia in chronic kidney disease (CKD) 09/19/2023   Acute metabolic encephalopathy 09/19/2023   Generalized anxiety disorder 09/18/2022   Major depressive disorder 09/18/2022   DJD (degenerative joint disease) of cervical spine 09/18/2022   Dementia due to Alzheimer's disease 12/19/2021   Senile entropion of right lower eyelid 06/07/2020   Intermediate stage nonexudative age-related macular degeneration of both eyes 12/08/2019   Vitreous prolapse of left eye 12/08/2019   Degenerative retinal drusen of left eye 12/08/2019   Degenerative retinal drusen of right eye 12/08/2019   Pseudophakia of both eyes 12/08/2019   CKD (chronic kidney disease), stage III 12/25/2017   Hyponatremia 12/25/2017   Hypokalemia 12/25/2017   Alcohol abuse 12/25/2017   Sigmoid diverticulosis 01/15/2017   Osteopenia 01/11/2015   Hyperlipidemia 01/11/2015   Insomnia 10/08/2013   HTN (hypertension) 03/07/2012    Orientation RESPIRATION BLADDER Height & Weight     Self  Normal Indwelling catheter, Incontinent Weight: 122 lb 9.2 oz (55.6  kg) Height:  5\' 2"  (157.5 cm)  BEHAVIORAL SYMPTOMS/MOOD NEUROLOGICAL BOWEL NUTRITION STATUS      Incontinent Diet (regular)  AMBULATORY STATUS COMMUNICATION OF NEEDS Skin   Extensive Assist Verbally Normal                       Personal Care Assistance Level of Assistance  Bathing, Feeding, Dressing, Total care Bathing Assistance: Limited assistance Feeding assistance: Limited assistance Dressing Assistance: Limited assistance Total Care Assistance: Maximum assistance   Functional Limitations Info  Sight, Hearing, Speech Sight Info: Adequate Hearing Info: Adequate Speech Info: Adequate    SPECIAL CARE FACTORS FREQUENCY  PT (By licensed PT), OT (By licensed OT)     PT Frequency: 5x/wk OT Frequency: 5x/wk            Contractures Contractures Info: Not present    Additional Factors Info  Code Status, Allergies Code Status Info: Full Allergies Info: Ace Inhibitors, Sulfa Antibiotics, Wellbutrin (Bupropion)           Current Medications (10/31/2023):  This is the current hospital active medication list Current Facility-Administered Medications  Medication Dose Route Frequency Provider Last Rate Last Admin   acetaminophen (TYLENOL) tablet 1,000 mg  1,000 mg Oral TID Vann, Jessica U, DO   1,000 mg at 10/31/23 0914   bisacodyl (DULCOLAX) EC tablet 5 mg  5 mg Oral Daily PRN Opyd, Lavone Neri, MD       Chlorhexidine Gluconate Cloth 2 % PADS 6 each  6 each Topical Daily Marlin Canary U, DO   6 each at 10/31/23 1049   fentaNYL (SUBLIMAZE) injection 12.5-50  mcg  12.5-50 mcg Intravenous Q2H PRN Opyd, Lavone Neri, MD       heparin injection 5,000 Units  5,000 Units Subcutaneous Q8H Opyd, Lavone Neri, MD   5,000 Units at 10/31/23 1433   levothyroxine (SYNTHROID) tablet 25 mcg  25 mcg Oral Daily Opyd, Lavone Neri, MD   25 mcg at 10/31/23 0508   lidocaine (LIDODERM) 5 % 1 patch  1 patch Transdermal Q24H Opyd, Lavone Neri, MD   1 patch at 10/30/23 2019   methocarbamol (ROBAXIN) injection  500 mg  500 mg Intravenous Q6H PRN Opyd, Lavone Neri, MD   500 mg at 10/31/23 1146   ondansetron (ZOFRAN) tablet 4 mg  4 mg Oral Q6H PRN Opyd, Lavone Neri, MD       Or   ondansetron (ZOFRAN) injection 4 mg  4 mg Intravenous Q6H PRN Opyd, Lavone Neri, MD       oxyCODONE (Oxy IR/ROXICODONE) immediate release tablet 2.5 mg  2.5 mg Oral Q4H PRN Opyd, Lavone Neri, MD   2.5 mg at 10/31/23 1146   polyethylene glycol (MIRALAX / GLYCOLAX) packet 17 g  17 g Oral Daily PRN Opyd, Lavone Neri, MD       QUEtiapine (SEROQUEL) tablet 25 mg  25 mg Oral QHS PRN Opyd, Lavone Neri, MD   25 mg at 10/30/23 2207     Discharge Medications: Please see discharge summary for a list of discharge medications.  Relevant Imaging Results:  Relevant Lab Results:   Additional Information SSN: [id number]  Amada Jupiter, LCSW

## 2023-10-31 NOTE — TOC Initial Note (Addendum)
 Transition of Care [facility]) - Initial/Assessment Note    Patient Details  Name: Allison Chavez MRN: [id number] Date of Birth: Apr 24, 1941  [facility] CM/SW Contact:    Amada Jupiter, LCSW Phone Number: 10/31/2023, 10:10 AM  Clinical Narrative:                  ADDENDUM:  Spoke with pt's spouse this afternoon who confirms he would like to have CSW pursue SNF bed placement.  Bed search initiated.  Met with pt yesterday to introduce CSW/ TOC role with dc planning needs.  Pt lying in bed and smiling, however, oriented to person only.   Contacted pt's spouse, Fredrik Cove, who reports that he remains legally married to pt, however, does not live in the home.  He reports that there is a caregiver living in the basement who shares support for pt with two additional caregivers.  Per spouse, "there are 3 caregivers.  She is never alone."  Reviewed pt's current medical issues with T10 fx and recommendation for SNF.  Spouse reports that pt was recently at [facility] for rehab but returned home once the co-pay days began because they could not afford the cost.   Because spouse is reporting that pt has 24/7 caregivers, I asked if I could speak with the caregiver, Assunta Gambles, who lives in the home and spouse agreeable.  Met with Ms. Flynt who confirms that she lives in the home, however, she does work as well. Explained to her that pt's spouse reports there are two additional caregivers involved and that pt "never alone."  She refutes this and expresses a lot of concern that pt is a "high fall risk... has fallen lots of times.Marland Kitchenand I can't provide the amount of care she needs now."  Ms. Flynt, again, reports that pt does NOT have someone in the home 24/7.  Notes the other caregiver and a friend attempt to provide assist as they can but cannot cover 24/7.    Of note, have received a call from [facility]  - agency providing [facility] services to pt PTA.  Agency reports that they will not be able to continue to  providing services because pt does not have safe living situation and is being left alone at times.    TOC to follow up with spouse today and discuss concerns and attempt to determine a safe discharge plan for pt.      Expected Discharge Plan: Skilled Nursing Facility (vs. home) Barriers to Discharge: Continued Medical Work up, English as a second language teacher, SNF Pending bed offer   Patient Goals and CMS Choice Patient states their goals for this hospitalization and ongoing recovery are:: go home   Choice offered to / list presented to : [facility] POA / Guardian Jal ownership interest in [facility].provided to::  [facility] POA / Guardian    Expected Discharge Plan and Services In-house Referral: Clinical Social Work   Post Acute Care Choice: Skilled Nursing Facility Living arrangements for the past 2 months: Single Family Home                                      Prior Living Arrangements/Services Living arrangements for the past 2 months: Single Family Home Lives with:: Other (Comment) (caregiver living in the home) Patient language and need for interpreter reviewed:: Yes Do you feel safe going back to the place where you live?:  (pt  says "yes" however not fully oriented)      Need for Family Participation in Patient Care: Yes (Comment) Care giver support system in place?: Yes (comment) Current home services:  ([facility] following PTA) Criminal Activity/Legal Involvement Pertinent to Current Situation/Hospitalization: No - Comment as needed  Activities of Daily Living   ADL Screening (condition at time of admission) Independently performs ADLs?: No Does the patient have a NEW difficulty with bathing/dressing/toileting/self-feeding that is expected to last >3 days?: Yes (Initiates electronic notice to provider for possible OT consult) Does the patient have a NEW difficulty with getting in/out of bed, walking, or climbing stairs that is expected to last >3 days?: Yes  (Initiates electronic notice to provider for possible PT consult) Does the patient have a NEW difficulty with communication that is expected to last >3 days?: No Is the patient deaf or have difficulty hearing?: No Does the patient have difficulty seeing, even when wearing glasses/contacts?: No Does the patient have difficulty concentrating, remembering, or making decisions?: Yes  Permission Sought/Granted Permission sought to share information with : Family Supports Permission granted to share information with : Yes, Verbal Permission Granted  Share Information with NAME: spouse, Obelia Bonello @ [phone]     Permission granted to share info w Relationship: Assunta Gambles, caregiver, @ [phone]     Emotional Assessment Appearance:: Appears stated age Attitude/Demeanor/Rapport: Gracious Affect (typically observed): Pleasant Orientation: : Oriented to Self Alcohol / Substance Use: Not Applicable Psych Involvement: No (comment)  Admission diagnosis:  Thoracic compression fracture, closed, initial encounter (HCC) [S22.000A] Compression fracture of T10 vertebra, initial encounter (HCC) [S22.070A] Patient Active Problem List   Diagnosis Date Noted   Thoracic compression fracture, closed, initial encounter (HCC) 10/29/2023   Hypothyroidism 10/29/2023   FTT (failure to thrive) in adult 09/19/2023   Anemia in chronic kidney disease (CKD) 09/19/2023   Acute metabolic encephalopathy 09/19/2023   Generalized anxiety disorder 09/18/2022   Major depressive disorder 09/18/2022   DJD (degenerative joint disease) of cervical spine 09/18/2022   Dementia due to Alzheimer's disease 12/19/2021   Senile entropion of right lower eyelid 06/07/2020   Intermediate stage nonexudative age-related macular degeneration of both eyes 12/08/2019   Vitreous prolapse of left eye 12/08/2019   Degenerative retinal drusen of left eye 12/08/2019   Degenerative retinal drusen of right eye 12/08/2019   Pseudophakia  of both eyes 12/08/2019   CKD (chronic kidney disease), stage III 12/25/2017   Hyponatremia 12/25/2017   Hypokalemia 12/25/2017   Alcohol abuse 12/25/2017   Sigmoid diverticulosis 01/15/2017   Osteopenia 01/11/2015   Hyperlipidemia 01/11/2015   Insomnia 10/08/2013   HTN (hypertension) 03/07/2012   PCP:  Shade Flood, MD Pharmacy:   [facility] [place], [place] - [address] [facility] [address] [facility] [address] [place] [address] Phone: [phone] Fax: [phone]     Social Drivers of Health (SDOH) Social History: SDOH Screenings   Food Insecurity: No Food Insecurity (10/29/2023)  Housing: Low Risk  (10/29/2023)  Transportation Needs: No Transportation Needs (10/29/2023)  Utilities: Not At Risk (10/29/2023)  Alcohol Screen: Low Risk  (12/13/2022)  Depression (PHQ2-9): Low Risk  (06/26/2023)  Financial Resource Strain: Low Risk  (12/13/2022)  Physical Activity: Insufficiently Active (12/13/2022)  Social Connections: Socially Integrated (10/29/2023)  Stress: No Stress Concern Present (12/13/2022)  Tobacco Use: Medium Risk (10/29/2023)   SDOH Interventions:     Readmission Risk Interventions    09/23/2023    2:35 PM  Readmission Risk Prevention  Plan  Transportation Screening Complete  PCP or Specialist Appt within 5-7 Days Complete  Home Care Screening Complete  Medication Review (RN CM) Complete

## 2023-10-31 NOTE — Progress Notes (Signed)
 PROGRESS NOTE    Allison Chavez  WUJ:[id number] DOB: 10/05/1940 DOA: 10/29/2023 PCP: Shade Flood, MD    Brief Narrative:  Allison Chavez is a 83 y.o. female with medical history significant for dementia, anxiety, hypertension, hypothyroidism, and CKD 3B who presents with pain.   The patient had a fall at home 2 weeks ago. Since then, she has been screaming in pain when caregivers attempt to bathe her or change her clothes.  It has been difficult to determine the site of pain and the patient has difficulty providing history due to her dementia.    Assessment and Plan: T10 compression fracture  - Patient has been screaming in pain whenever caregivers move her ever since a fall 2 wks ago; she is found to have T10 compression fracture   - Neurosurgery recommended TLSO brace and outpatient follow-up but patient's friend is concerned that the patient will not have anyone to care for her at home  - Continue TLSO brace and pain-control, consult PT and TOC - -SNF placement -schedule tylenol for now for pain and PRN oxy (does not look like she has been requiring the IV or PO pain meds)    Dementia  - Delirium precautions   -unclear baseline    Anxiety  - Seroquel     Hypothyroidism  - Synthroid      CKD 3B  - Appears close to baseline  - Renally-dose medications      DVT prophylaxis: heparin injection 5,000 Units Start: 10/29/23 2200    Code Status: Full Code Family Communication: none at bedside  Disposition Plan:  Level of care: Med-Surg Status is: Observation     Consultants:  PT   Subjective: Denies pain at rest  Objective: Vitals:   10/30/23 2125 10/31/23 0130 10/31/23 0518 10/31/23 0913  BP: (!) 150/76 126/66 (!) 137/59 129/68  Pulse: 80 83 74 76  Resp: 16 16 16 16   Temp: 99.1 F (37.3 C) 97.8 F (36.6 C) 97.8 F (36.6 C) 97.8 F (36.6 C)  TempSrc: Oral Oral Oral   SpO2: 97% 99% 98%   Weight:      Height:        Intake/Output Summary (Last 24  hours) at 10/31/2023 0945 Last data filed at 10/31/2023 9562 Gross per 24 hour  Intake 600 ml  Output 1750 ml  Net -1150 ml   Filed Weights   10/29/23 2217  Weight: 55.6 kg    Examination:     General: Appearance:    Well developed, well nourished female in no acute distress     Lungs:      respirations unlabored  Heart:    Normal heart rate. Normal rhythm. No murmurs, rubs, or gallops.   MS:   All extremities are intact.   Neurologic:   Awake, alert      Data Reviewed: I have personally reviewed following labs and imaging studies  CBC: Recent Labs  Lab 10/29/23 1538 10/30/23 0313  WBC 6.3 9.5  NEUTROABS 2.6  --   HGB 9.6* 9.8*  HCT 31.1* 31.2*  MCV 98.4 97.8  PLT 209 219   Basic Metabolic Panel: Recent Labs  Lab 10/29/23 1538 10/30/23 0313  NA 135 135  K 4.2 3.9  CL 104 105  CO2 22 21*  GLUCOSE 111* 103*  BUN 20 19  CREATININE 1.41* 1.41*  CALCIUM 9.0 8.9   GFR: Estimated Creatinine Clearance: 25.2 mL/min (A) (by C-G formula based on SCr of 1.41 mg/dL (H)). Liver  Function Tests: Recent Labs  Lab 10/29/23 1538  AST 13*  ALT 10  ALKPHOS 76  BILITOT 0.6  PROT 6.7  ALBUMIN 3.4*   No results for input(s): "LIPASE", "AMYLASE" in the last 168 hours. No results for input(s): "AMMONIA" in the last 168 hours. Coagulation Profile: No results for input(s): "INR", "PROTIME" in the last 168 hours. Cardiac Enzymes: No results for input(s): "CKTOTAL", "CKMB", "CKMBINDEX", "TROPONINI" in the last 168 hours. BNP (last 3 results) No results for input(s): "PROBNP" in the last 8760 hours. HbA1C: No results for input(s): "HGBA1C" in the last 72 hours. CBG: No results for input(s): "GLUCAP" in the last 168 hours. Lipid Profile: No results for input(s): "CHOL", "HDL", "LDLCALC", "TRIG", "CHOLHDL", "LDLDIRECT" in the last 72 hours. Thyroid Function Tests: No results for input(s): "TSH", "T4TOTAL", "FREET4", "T3FREE", "THYROIDAB" in the last 72 hours. Anemia  Panel: No results for input(s): "VITAMINB12", "FOLATE", "FERRITIN", "TIBC", "IRON", "RETICCTPCT" in the last 72 hours. Sepsis Labs: No results for input(s): "PROCALCITON", "LATICACIDVEN" in the last 168 hours.  No results found for this or any previous visit (from the past 240 hours).       Radiology Studies: CT ABDOMEN PELVIS W CONTRAST Result Date: 10/29/2023 CLINICAL DATA:  Urinary tract infection, abdominal pain EXAM: CT ABDOMEN AND PELVIS WITH CONTRAST TECHNIQUE: Multidetector CT imaging of the abdomen and pelvis was performed using the standard protocol following bolus administration of intravenous contrast. RADIATION DOSE REDUCTION: This exam was performed according to the departmental dose-optimization program which includes automated exposure control, adjustment of the mA and/or kV according to patient size and/or use of iterative reconstruction technique. CONTRAST:  80mL OMNIPAQUE IOHEXOL 300 MG/ML  SOLN COMPARISON:  02/23/2019, 07/19/2023, 09/19/2023 FINDINGS: Lower chest: Marked bibasilar pulmonary scarring and fibrosis. No acute airspace disease. Hepatobiliary: No focal liver abnormality is seen. No gallstones, gallbladder wall thickening, or biliary dilatation. Pancreas: Unremarkable. No pancreatic ductal dilatation or surrounding inflammatory changes. Spleen: Normal in size without focal abnormality. Adrenals/Urinary Tract: Adrenal glands are unremarkable. Kidneys are normal, without renal calculi, focal lesion, or hydronephrosis. Bladder is decompressed with a Foley catheter, limiting its evaluation. Stomach/Bowel: No bowel obstruction or ileus. Normal appendix right lower quadrant. Diffuse diverticulosis throughout the descending and sigmoid colon. No evidence of acute diverticulitis. No bowel wall thickening or inflammatory change. Vascular/Lymphatic: Aortic atherosclerosis. No enlarged abdominal or pelvic lymph nodes. Reproductive: Uterus and bilateral adnexa are unremarkable. Other:  No free fluid or free intraperitoneal gas. No abdominal wall hernia. Musculoskeletal: Since the prior exams, a T10 wedge compression deformity has developed, with greater than 75% loss of height. No evidence of retropulsion. No other acute fractures. Unilateral left L5 spondylolysis without spondylolisthesis. Reconstructed images demonstrate no additional findings. IMPRESSION: 1. T10 wedge compression deformity with greater than 75% loss of height, age indeterminate but new since chest x-ray performed 09/19/2023. 2. Distal colonic diverticulosis without diverticulitis. 3.  Aortic Atherosclerosis (ICD10-I70.0). Electronically Signed   By: Sharlet Salina M.D.   On: 10/29/2023 19:48   VAS [place] LOWER EXTREMITY VENOUS (DVT) (ONLY [facility] & [facility]) Result Date: 10/29/2023  Lower Venous DVT Study Patient Name:  Allison Chavez  Date of Exam:   10/29/2023 Medical Rec #: [id number]     Accession #:    [id number] Date of Birth: [birth year]     Patient Gender: F Patient Age:   75 years Exam Location:  [facility] Procedure:      VAS [place] LOWER EXTREMITY VENOUS (DVT) Referring Phys: Melvenia Beam UPSTILL --------------------------------------------------------------------------------  Indications:  Groin pain.  Limitations: Patient immobility. Comparison Study: No previous exams Performing Technologist: Jody Hill RVT, RDMS  Examination Guidelines: A complete evaluation includes B-mode imaging, spectral Doppler, color Doppler, and power Doppler as needed of all accessible portions of each vessel. Bilateral testing is considered an integral part of a complete examination. Limited examinations for reoccurring indications may be performed as noted. The reflux portion of the exam is performed with the patient in reverse Trendelenburg.  +--------+---------------+---------+-----------+----------+--------------------+ RIGHT   CompressibilityPhasicitySpontaneityPropertiesThrombus Aging        +--------+---------------+---------+-----------+----------+--------------------+ CFV     Full           Yes      Yes                                       +--------+---------------+---------+-----------+----------+--------------------+ SFJ     Full                                                              +--------+---------------+---------+-----------+----------+--------------------+ FV Prox Full           Yes      Yes                                       +--------+---------------+---------+-----------+----------+--------------------+ FV Mid  Full           Yes      Yes                                       +--------+---------------+---------+-----------+----------+--------------------+ FV      Full           Yes      Yes                                       Distal                                                                    +--------+---------------+---------+-----------+----------+--------------------+ PFV                    Yes      Yes                  patent by                                                                 color/doppler        +--------+---------------+---------+-----------+----------+--------------------+ POP     Full  Yes      Yes                                       +--------+---------------+---------+-----------+----------+--------------------+ PTV     Full                                                              +--------+---------------+---------+-----------+----------+--------------------+ PERO    Full                                                              +--------+---------------+---------+-----------+----------+--------------------+   +--------+---------------+---------+-----------+----------+--------------------+ LEFT    CompressibilityPhasicitySpontaneityPropertiesThrombus Aging       +--------+---------------+---------+-----------+----------+--------------------+  CFV     Full           Yes      Yes                                       +--------+---------------+---------+-----------+----------+--------------------+ SFJ     Full                                                              +--------+---------------+---------+-----------+----------+--------------------+ FV Prox Full           Yes      Yes                                       +--------+---------------+---------+-----------+----------+--------------------+ FV Mid  Full           Yes      Yes                                       +--------+---------------+---------+-----------+----------+--------------------+ FV      Full           Yes      Yes                                       Distal                                                                    +--------+---------------+---------+-----------+----------+--------------------+ PFV                    Yes  Yes                  patent by                                                                 color/doppler        +--------+---------------+---------+-----------+----------+--------------------+ POP     Full           Yes      Yes                                       +--------+---------------+---------+-----------+----------+--------------------+ PTV     Full                                                              +--------+---------------+---------+-----------+----------+--------------------+ PERO    Full                                                              +--------+---------------+---------+-----------+----------+--------------------+     Summary: BILATERAL: - No evidence of deep vein thrombosis seen in the lower extremities, bilaterally. -No evidence of popliteal cyst, bilaterally.   *See table(s) above for measurements and observations. Electronically signed by Lemar Livings MD on 10/29/2023 at 4:46:14 PM.    Final         Scheduled Meds:   acetaminophen  1,000 mg Oral TID   Chlorhexidine Gluconate Cloth  6 each Topical Daily   heparin  5,000 Units Subcutaneous Q8H   levothyroxine  25 mcg Oral Daily   lidocaine  1 patch Transdermal Q24H   Continuous Infusions:   LOS: 0 days    Time spent: 45 minutes spent on chart review, discussion with nursing staff, consultants, updating family and interview/physical exam; more than 50% of that time was spent in counseling and/or coordination of care.    Joseph Art, DO [facility] Available via Epic secure chat 7am-7pm After these hours, please refer to coverage provider listed on [email or web address] 10/31/2023, 9:45 AM

## 2023-11-01 DIAGNOSIS — R627 Adult failure to thrive: Secondary | ICD-10-CM | POA: Diagnosis not present

## 2023-11-01 DIAGNOSIS — K5730 Diverticulosis of large intestine without perforation or abscess without bleeding: Secondary | ICD-10-CM | POA: Diagnosis not present

## 2023-11-01 DIAGNOSIS — S22079A Unspecified fracture of T9-T10 vertebra, initial encounter for closed fracture: Secondary | ICD-10-CM | POA: Diagnosis not present

## 2023-11-01 DIAGNOSIS — Z743 Need for continuous supervision: Secondary | ICD-10-CM | POA: Diagnosis not present

## 2023-11-01 DIAGNOSIS — I129 Hypertensive chronic kidney disease with stage 1 through stage 4 chronic kidney disease, or unspecified chronic kidney disease: Secondary | ICD-10-CM | POA: Diagnosis not present

## 2023-11-01 DIAGNOSIS — M8580 Other specified disorders of bone density and structure, unspecified site: Secondary | ICD-10-CM | POA: Diagnosis not present

## 2023-11-01 DIAGNOSIS — E8809 Other disorders of plasma-protein metabolism, not elsewhere classified: Secondary | ICD-10-CM | POA: Diagnosis not present

## 2023-11-01 DIAGNOSIS — Z9181 History of falling: Secondary | ICD-10-CM | POA: Diagnosis not present

## 2023-11-01 DIAGNOSIS — E039 Hypothyroidism, unspecified: Secondary | ICD-10-CM | POA: Diagnosis not present

## 2023-11-01 DIAGNOSIS — W19XXXA Unspecified fall, initial encounter: Secondary | ICD-10-CM | POA: Diagnosis not present

## 2023-11-01 DIAGNOSIS — R197 Diarrhea, unspecified: Secondary | ICD-10-CM | POA: Diagnosis not present

## 2023-11-01 DIAGNOSIS — S22000D Wedge compression fracture of unspecified thoracic vertebra, subsequent encounter for fracture with routine healing: Secondary | ICD-10-CM | POA: Diagnosis not present

## 2023-11-01 DIAGNOSIS — R339 Retention of urine, unspecified: Secondary | ICD-10-CM | POA: Diagnosis not present

## 2023-11-01 DIAGNOSIS — G308 Other Alzheimer's disease: Secondary | ICD-10-CM | POA: Diagnosis not present

## 2023-11-01 DIAGNOSIS — R262 Difficulty in walking, not elsewhere classified: Secondary | ICD-10-CM | POA: Diagnosis not present

## 2023-11-01 DIAGNOSIS — R296 Repeated falls: Secondary | ICD-10-CM | POA: Diagnosis not present

## 2023-11-01 DIAGNOSIS — H353132 Nonexudative age-related macular degeneration, bilateral, intermediate dry stage: Secondary | ICD-10-CM | POA: Diagnosis not present

## 2023-11-01 DIAGNOSIS — N1832 Chronic kidney disease, stage 3b: Secondary | ICD-10-CM | POA: Diagnosis not present

## 2023-11-01 DIAGNOSIS — D631 Anemia in chronic kidney disease: Secondary | ICD-10-CM | POA: Diagnosis not present

## 2023-11-01 DIAGNOSIS — I10 Essential (primary) hypertension: Secondary | ICD-10-CM | POA: Diagnosis not present

## 2023-11-01 DIAGNOSIS — M24542 Contracture, left hand: Secondary | ICD-10-CM | POA: Diagnosis not present

## 2023-11-01 DIAGNOSIS — Z87891 Personal history of nicotine dependence: Secondary | ICD-10-CM | POA: Diagnosis not present

## 2023-11-01 DIAGNOSIS — S22070A Wedge compression fracture of T9-T10 vertebra, initial encounter for closed fracture: Secondary | ICD-10-CM | POA: Diagnosis not present

## 2023-11-01 DIAGNOSIS — R2681 Unsteadiness on feet: Secondary | ICD-10-CM | POA: Diagnosis not present

## 2023-11-01 DIAGNOSIS — G8929 Other chronic pain: Secondary | ICD-10-CM | POA: Diagnosis not present

## 2023-11-01 DIAGNOSIS — G4700 Insomnia, unspecified: Secondary | ICD-10-CM | POA: Diagnosis not present

## 2023-11-01 DIAGNOSIS — S22070D Wedge compression fracture of T9-T10 vertebra, subsequent encounter for fracture with routine healing: Secondary | ICD-10-CM | POA: Diagnosis not present

## 2023-11-01 DIAGNOSIS — M6281 Muscle weakness (generalized): Secondary | ICD-10-CM | POA: Diagnosis not present

## 2023-11-01 DIAGNOSIS — I700 Atherosclerosis of aorta: Secondary | ICD-10-CM | POA: Diagnosis not present

## 2023-11-01 DIAGNOSIS — Z79899 Other long term (current) drug therapy: Secondary | ICD-10-CM | POA: Diagnosis not present

## 2023-11-01 DIAGNOSIS — M549 Dorsalgia, unspecified: Secondary | ICD-10-CM | POA: Diagnosis not present

## 2023-11-01 DIAGNOSIS — S22000A Wedge compression fracture of unspecified thoracic vertebra, initial encounter for closed fracture: Secondary | ICD-10-CM | POA: Diagnosis not present

## 2023-11-01 DIAGNOSIS — R404 Transient alteration of awareness: Secondary | ICD-10-CM | POA: Diagnosis not present

## 2023-11-01 DIAGNOSIS — E785 Hyperlipidemia, unspecified: Secondary | ICD-10-CM | POA: Diagnosis not present

## 2023-11-01 DIAGNOSIS — G309 Alzheimer's disease, unspecified: Secondary | ICD-10-CM | POA: Diagnosis not present

## 2023-11-01 DIAGNOSIS — R52 Pain, unspecified: Secondary | ICD-10-CM | POA: Diagnosis not present

## 2023-11-01 DIAGNOSIS — R21 Rash and other nonspecific skin eruption: Secondary | ICD-10-CM | POA: Diagnosis not present

## 2023-11-01 DIAGNOSIS — N183 Chronic kidney disease, stage 3 unspecified: Secondary | ICD-10-CM | POA: Diagnosis not present

## 2023-11-01 DIAGNOSIS — Z7401 Bed confinement status: Secondary | ICD-10-CM | POA: Diagnosis not present

## 2023-11-01 DIAGNOSIS — Z7409 Other reduced mobility: Secondary | ICD-10-CM | POA: Diagnosis not present

## 2023-11-01 LAB — CBC
HCT: 26.8 % — ABNORMAL LOW (ref 36.0–46.0)
Hemoglobin: 8.6 g/dL — ABNORMAL LOW (ref 12.0–15.0)
MCH: 31.2 pg (ref 26.0–34.0)
MCHC: 32.1 g/dL (ref 30.0–36.0)
MCV: 97.1 fL (ref 80.0–100.0)
Platelets: 180 10*3/uL (ref 150–400)
RBC: 2.76 MIL/uL — ABNORMAL LOW (ref 3.87–5.11)
RDW: 19.1 % — ABNORMAL HIGH (ref 11.5–15.5)
WBC: 8.3 10*3/uL (ref 4.0–10.5)
nRBC: 0 % (ref 0.0–0.2)

## 2023-11-01 LAB — BASIC METABOLIC PANEL WITH GFR
Anion gap: 8 (ref 5–15)
BUN: 20 mg/dL (ref 8–23)
CO2: 21 mmol/L — ABNORMAL LOW (ref 22–32)
Calcium: 8.5 mg/dL — ABNORMAL LOW (ref 8.9–10.3)
Chloride: 103 mmol/L (ref 98–111)
Creatinine, Ser: 1.46 mg/dL — ABNORMAL HIGH (ref 0.44–1.00)
GFR, Estimated: 36 mL/min — ABNORMAL LOW (ref >60)
Glucose, Bld: 79 mg/dL (ref 70–99)
Potassium: 3.9 mmol/L (ref 3.5–5.1)
Sodium: 132 mmol/L — ABNORMAL LOW (ref 135–145)

## 2023-11-01 MED ORDER — ACETAMINOPHEN 500 MG PO TABS: 1000.0000 mg | ORAL_TABLET | Freq: Three times a day (TID) | ORAL | Status: AC

## 2023-11-01 MED ORDER — METHOCARBAMOL 500 MG PO TABS: 250.0000 mg | ORAL_TABLET | Freq: Three times a day (TID) | ORAL | Status: AC | PRN

## 2023-11-01 MED ORDER — OXYCODONE HCL 5 MG PO TABS: 2.5000 mg | ORAL_TABLET | ORAL | 0 refills | Status: AC | PRN

## 2023-11-01 MED ORDER — LIDOCAINE 5 % EX PTCH: 1.0000 | MEDICATED_PATCH | CUTANEOUS | Status: AC

## 2023-11-01 NOTE — Progress Notes (Signed)
 Foley catheter removed around 1130 as ordered by MD. Pt was unable to void until 4 pm. MD notified and new foley catheter placed by 2 RN per order. Clear yellow urine draining in the bag.

## 2023-11-01 NOTE — Discharge Summary (Signed)
 Physician Discharge Summary  Allison Chavez:[id number] DOB: 12-05-1940 DOA: 10/29/2023  PCP: Shade Flood, MD  Admit date: 10/29/2023 Discharge date: 11/01/2023  Admitted From: home Discharge disposition: SNF   Recommendations for Outpatient Follow-Up:   TLSO brace-- off for showers Cbc/bmp 1 week Consider palliative care to follow at SNF   Discharge Diagnosis:   Principal Problem:   Thoracic compression fracture, closed, initial encounter (HCC) Active Problems:   CKD (chronic kidney disease), stage III   Dementia due to Alzheimer's disease   Generalized anxiety disorder   Hypothyroidism    Discharge Condition: Improved.  Diet recommendation:Regular.  Wound care: None.  Code status: Full.   History of Present Illness:   Allison Chavez is a 83 y.o. female with medical history significant for dementia, anxiety, hypertension, hypothyroidism, and CKD 3B who presents with pain.   The patient had a fall at home 2 weeks ago. Since then, she has been screaming in pain when caregivers attempt to bathe her or change her clothes.  It has been difficult to determine the site of pain and the patient has difficulty providing history due to her dementia.    ED Course: Upon arrival to the ED, patient is found to be afebrile and saturating well on room air with normal HR and stable BP.  Labs are most notable for creatinine 1.41 and hemoglobin 9.6.  CT demonstrates T10 wedge compression deformity with >75% height loss.   Hospital Course by Problem:   T10 compression fracture  - Patient has been screaming in pain whenever caregivers move her ever since a fall 2 wks ago; she is found to have T10 compression fracture   - Neurosurgery recommended TLSO brace and outpatient follow-up but patient's friend is concerned that the patient will not have anyone to care for her at home  - Continue TLSO brace and pain-control, consult PT and TOC - -SNF placement -schedule tylenol for now  for pain and PRN oxy     Dementia  - Delirium precautions   -unclear baseline    Anxiety  - Seroquel     Hypothyroidism  - Synthroid       CKD 3B  - Appears close to baseline  - Renally-dose medications    Acute urinary retention -foley placed in ER? -voiding trial      Medical Consultants:   Neuro surgery   Discharge Exam:   Vitals:   10/31/23 2113 11/01/23 0530  BP: (!) 158/74 (!) 145/75  Pulse: 76 83  Resp: 15 16  Temp: 99.1 F (37.3 C) 99.6 F (37.6 C)  SpO2: 96% 98%   Vitals:   10/31/23 0913 10/31/23 1326 10/31/23 2113 11/01/23 0530  BP: 129/68 (!) 161/72 (!) 158/74 (!) 145/75  Pulse: 76 72 76 83  Resp: 16 18 15 16   Temp: 97.8 F (36.6 C) 98 F (36.7 C) 99.1 F (37.3 C) 99.6 F (37.6 C)  TempSrc:  Oral Oral Oral  SpO2:  99% 96% 98%  Weight:      Height:        General exam: Appears calm and comfortable.    The results of significant diagnostics from this hospitalization (including imaging, microbiology, ancillary and laboratory) are listed below for reference.     Procedures and Diagnostic Studies:   CT ABDOMEN PELVIS W CONTRAST Result Date: 10/29/2023 CLINICAL DATA:  Urinary tract infection, abdominal pain EXAM: CT ABDOMEN AND PELVIS WITH CONTRAST TECHNIQUE: Multidetector CT imaging of the abdomen and  pelvis was performed using the standard protocol following bolus administration of intravenous contrast. RADIATION DOSE REDUCTION: This exam was performed according to the departmental dose-optimization program which includes automated exposure control, adjustment of the mA and/or kV according to patient size and/or use of iterative reconstruction technique. CONTRAST:  80mL OMNIPAQUE IOHEXOL 300 MG/ML  SOLN COMPARISON:  02/23/2019, 07/19/2023, 09/19/2023 FINDINGS: Lower chest: Marked bibasilar pulmonary scarring and fibrosis. No acute airspace disease. Hepatobiliary: No focal liver abnormality is seen. No gallstones, gallbladder wall thickening, or  biliary dilatation. Pancreas: Unremarkable. No pancreatic ductal dilatation or surrounding inflammatory changes. Spleen: Normal in size without focal abnormality. Adrenals/Urinary Tract: Adrenal glands are unremarkable. Kidneys are normal, without renal calculi, focal lesion, or hydronephrosis. Bladder is decompressed with a Foley catheter, limiting its evaluation. Stomach/Bowel: No bowel obstruction or ileus. Normal appendix right lower quadrant. Diffuse diverticulosis throughout the descending and sigmoid colon. No evidence of acute diverticulitis. No bowel wall thickening or inflammatory change. Vascular/Lymphatic: Aortic atherosclerosis. No enlarged abdominal or pelvic lymph nodes. Reproductive: Uterus and bilateral adnexa are unremarkable. Other: No free fluid or free intraperitoneal gas. No abdominal wall hernia. Musculoskeletal: Since the prior exams, a T10 wedge compression deformity has developed, with greater than 75% loss of height. No evidence of retropulsion. No other acute fractures. Unilateral left L5 spondylolysis without spondylolisthesis. Reconstructed images demonstrate no additional findings. IMPRESSION: 1. T10 wedge compression deformity with greater than 75% loss of height, age indeterminate but new since chest x-ray performed 09/19/2023. 2. Distal colonic diverticulosis without diverticulitis. 3.  Aortic Atherosclerosis (ICD10-I70.0). Electronically Signed   By: Sharlet Salina M.D.   On: 10/29/2023 19:48   VAS [place] LOWER EXTREMITY VENOUS (DVT) (ONLY [facility] & [facility]) Result Date: 10/29/2023  Lower Venous DVT Study Patient Name:  Allison Chavez  Date of Exam:   10/29/2023 Medical Rec #: [id number]     Accession #:    [id number] Date of Birth: 1941-02-12     Patient Gender: F Patient Age:   71 years Exam Location:  [facility] Procedure:      VAS [place] LOWER EXTREMITY VENOUS (DVT) Referring Phys: Melvenia Beam UPSTILL --------------------------------------------------------------------------------  Indications:  Groin pain.  Limitations: Patient immobility. Comparison Study: No previous exams Performing Technologist: Jody Hill RVT, RDMS  Examination Guidelines: A complete evaluation includes B-mode imaging, spectral Doppler, color Doppler, and power Doppler as needed of all accessible portions of each vessel. Bilateral testing is considered an integral part of a complete examination. Limited examinations for reoccurring indications may be performed as noted. The reflux portion of the exam is performed with the patient in reverse Trendelenburg.  +--------+---------------+---------+-----------+----------+--------------------+ RIGHT   CompressibilityPhasicitySpontaneityPropertiesThrombus Aging       +--------+---------------+---------+-----------+----------+--------------------+ CFV     Full           Yes      Yes                                       +--------+---------------+---------+-----------+----------+--------------------+ SFJ     Full                                                              +--------+---------------+---------+-----------+----------+--------------------+ FV Prox Full  Yes      Yes                                       +--------+---------------+---------+-----------+----------+--------------------+ FV Mid  Full           Yes      Yes                                       +--------+---------------+---------+-----------+----------+--------------------+ FV      Full           Yes      Yes                                       Distal                                                                    +--------+---------------+---------+-----------+----------+--------------------+ PFV                    Yes      Yes                  patent by                                                                 color/doppler        +--------+---------------+---------+-----------+----------+--------------------+ POP     Full           Yes       Yes                                       +--------+---------------+---------+-----------+----------+--------------------+ PTV     Full                                                              +--------+---------------+---------+-----------+----------+--------------------+ PERO    Full                                                              +--------+---------------+---------+-----------+----------+--------------------+   +--------+---------------+---------+-----------+----------+--------------------+ LEFT    CompressibilityPhasicitySpontaneityPropertiesThrombus Aging       +--------+---------------+---------+-----------+----------+--------------------+ CFV     Full           Yes      Yes                                       +--------+---------------+---------+-----------+----------+--------------------+  SFJ     Full                                                              +--------+---------------+---------+-----------+----------+--------------------+ FV Prox Full           Yes      Yes                                       +--------+---------------+---------+-----------+----------+--------------------+ FV Mid  Full           Yes      Yes                                       +--------+---------------+---------+-----------+----------+--------------------+ FV      Full           Yes      Yes                                       Distal                                                                    +--------+---------------+---------+-----------+----------+--------------------+ PFV                    Yes      Yes                  patent by                                                                 color/doppler        +--------+---------------+---------+-----------+----------+--------------------+ POP     Full           Yes      Yes                                        +--------+---------------+---------+-----------+----------+--------------------+ PTV     Full                                                              +--------+---------------+---------+-----------+----------+--------------------+ PERO    Full                                                              +--------+---------------+---------+-----------+----------+--------------------+  Summary: BILATERAL: - No evidence of deep vein thrombosis seen in the lower extremities, bilaterally. -No evidence of popliteal cyst, bilaterally.   *See table(s) above for measurements and observations. Electronically signed by Lemar Livings MD on 10/29/2023 at 4:46:14 PM.    Final      Labs:   Basic Metabolic Panel: Recent Labs  Lab 10/29/23 1538 10/30/23 0313 11/01/23 0312  NA 135 135 132*  K 4.2 3.9 3.9  CL 104 105 103  CO2 22 21* 21*  GLUCOSE 111* 103* 79  BUN 20 19 20   CREATININE 1.41* 1.41* 1.46*  CALCIUM 9.0 8.9 8.5*   GFR Estimated Creatinine Clearance: 24.3 mL/min (A) (by C-G formula based on SCr of 1.46 mg/dL (H)). Liver Function Tests: Recent Labs  Lab 10/29/23 1538  AST 13*  ALT 10  ALKPHOS 76  BILITOT 0.6  PROT 6.7  ALBUMIN 3.4*   No results for input(s): "LIPASE", "AMYLASE" in the last 168 hours. No results for input(s): "AMMONIA" in the last 168 hours. Coagulation profile No results for input(s): "INR", "PROTIME" in the last 168 hours.  CBC: Recent Labs  Lab 10/29/23 1538 10/30/23 0313 11/01/23 0312  WBC 6.3 9.5 8.3  NEUTROABS 2.6  --   --   HGB 9.6* 9.8* 8.6*  HCT 31.1* 31.2* 26.8*  MCV 98.4 97.8 97.1  PLT 209 219 180   Cardiac Enzymes: No results for input(s): "CKTOTAL", "CKMB", "CKMBINDEX", "TROPONINI" in the last 168 hours. BNP: Invalid input(s): "POCBNP" CBG: No results for input(s): "GLUCAP" in the last 168 hours. D-Dimer No results for input(s): "DDIMER" in the last 72 hours. Hgb A1c No results for input(s): "HGBA1C" in the last 72  hours. Lipid Profile No results for input(s): "CHOL", "HDL", "LDLCALC", "TRIG", "CHOLHDL", "LDLDIRECT" in the last 72 hours. Thyroid function studies No results for input(s): "TSH", "T4TOTAL", "T3FREE", "THYROIDAB" in the last 72 hours.  Invalid input(s): "FREET3" Anemia work up No results for input(s): "VITAMINB12", "FOLATE", "FERRITIN", "TIBC", "IRON", "RETICCTPCT" in the last 72 hours. Microbiology No results found for this or any previous visit (from the past 240 hours).   Discharge Instructions:   Discharge Instructions     Diet general   Complete by: As directed    Increase activity slowly   Complete by: As directed       Allergies as of 11/01/2023       Reactions   Ace Inhibitors Cough   Cough   Sulfa Antibiotics Hives   Wellbutrin [bupropion] Rash   Only with generic, immediate release        Medication List     TAKE these medications    acetaminophen 500 MG tablet Commonly known as: TYLENOL Take 2 tablets (1,000 mg total) by mouth 3 (three) times daily. What changed:  when to take this reasons to take this   levothyroxine 25 MCG tablet Commonly known as: SYNTHROID Take 1 tablet (25 mcg total) by mouth daily.   lidocaine 5 % Commonly known as: LIDODERM Place 1 patch onto the skin daily. Remove & Discard patch within 12 hours or as directed by MD   methocarbamol 500 MG tablet Commonly known as: ROBAXIN Take 0.5 tablets (250 mg total) by mouth every 8 (eight) hours as needed for muscle spasms.   oxyCODONE 5 MG immediate release tablet Commonly known as: Oxy IR/ROXICODONE Take 0.5 tablets (2.5 mg total) by mouth every 4 (four) hours as needed for moderate pain (pain score 4-6).   QUEtiapine 25 MG tablet Commonly known as:  SEROquel Take 1 tablet (25 mg total) by mouth at bedtime as needed. What changed: when to take this   thiamine 100 MG tablet Commonly known as: VITAMIN B1 Take 1 tablet (100 mg total) by mouth daily. What changed: when to  take this          Time coordinating discharge: 45 min  Signed:  Joseph Art DO  [facility] 11/01/2023, 1:33 PM

## 2023-11-01 NOTE — Progress Notes (Signed)
 [facility] called and report given to the receiving nurse. Pt all cleaned up and awaiting for [facility] to pick up.

## 2023-11-01 NOTE — Progress Notes (Addendum)
 Physical Therapy Treatment Patient Details Name: Allison Chavez MRN: [id number] DOB: 1941/06/29 Today's Date: 11/01/2023   History of Present Illness Pt is 83 yo female presenting from home on 10/29/23 with increased pain since fall 2 weeks ago.  Was difficulty determining site of pain due to dementia.  Pt found to have T10 compression fx with neurosurgery recommending management with TLSO. Pt with other hx including recent hospitalization, multiple recent ED presentations, dementia, anxiety, HTN, hypothyroidism, and CKD.    PT Comments  Pt making good progress today.  She was able to ambulate 8' with assist of 2 today.  Pt requiring cues for back precaution and transfer techniques.  Good participation with exercises and seemed to have overall improved pain control. With increased time was able to get washcloth in L hand to try to keep fingernails from cutting into palm from contractures - did note odor when hand slightly opened but unable to visualize palm.   Cont POC with recommendation for Patient will benefit from continued inpatient follow up therapy, <3 hours/day.     If plan is discharge home, recommend the following: A lot of help with walking and/or transfers;A lot of help with bathing/dressing/bathroom   Can travel by private vehicle     No  Equipment Recommendations  Wheelchair cushion (measurements PT);Wheelchair (measurements PT);BSC/3in1    Recommendations for Other Services       Precautions / Restrictions Precautions Precautions: Fall;Back Precaution Booklet Issued: Yes (comment) Recall of Precautions/Restrictions: Impaired Required Braces or Orthoses: Spinal Brace Spinal Brace: Thoracolumbosacral orthotic;Applied in sitting position     Mobility  Bed Mobility Overal bed mobility: Needs Assistance Bed Mobility: Rolling, Sidelying to Sit Rolling: Mod assist Sidelying to sit: Mod assist       General bed mobility comments: assisted with log roll technique     Transfers Overall transfer level: Needs assistance Equipment used: 2 person hand held assist Transfers: Sit to/from Stand Sit to Stand: Mod assist, +2 physical assistance           General transfer comment: R HHA and tech on L assisting with gait belt to stand    Ambulation/Gait Ambulation/Gait assistance: Mod assist, +2 physical assistance Gait Distance (Feet): 8 Feet Assistive device: 1 person hand held assist Gait Pattern/deviations: Step-to pattern, Decreased stride length, Shuffle, Trunk flexed Gait velocity: decreased     General Gait Details: PT providing HHA on R and support around buttock/hip to provide support and help weight shift.  Tech supporting L elbow and with gait belt.  Had chair follow.  Pt able to walk to door wtih assist   Stairs             Wheelchair Mobility     Tilt Bed    Modified Rankin (Stroke Patients Only)       Balance Overall balance assessment: Needs assistance Sitting-balance support: No upper extremity supported Sitting balance-Leahy Scale: Fair     Standing balance support: Bilateral upper extremity supported Standing balance-Leahy Scale: Poor                              Communication    Cognition Arousal: Alert Behavior During Therapy: WFL for tasks assessed/performed   PT - Cognitive impairments: History of cognitive impairments, Orientation, Awareness, Memory, Attention, Initiation, Sequencing, Problem solving, Safety/Judgement                       PT - Cognition  Comments: alert and oriented to self and place only; unable to provide history        Cueing    Exercises Total Joint Exercises Knee Flexion: AROM, 10 reps, Both, Seated General Exercises - Lower Extremity Ankle Circles/Pumps: AROM, Both, 10 reps, Seated Long Arc Quad: AROM, Both, 10 reps, Seated Other Exercises Other Exercises: cues for full ROM    General Comments General comments (skin integrity, edema, etc.): Spent  5 mins gradually trying to open L hand enough to place washcloth for pressure relief.  There was increase odor when digit 3 was lifted but not able to visualize.      Pertinent Vitals/Pain Pain Assessment Pain Assessment: Faces Faces Pain Scale: Hurts even more Pain Location: L hand when trying to place wash cloth in palm; did not c/o back pain today Pain Descriptors / Indicators: Grimacing, Guarding Pain Intervention(s): Limited activity within patient's tolerance, Monitored during session, Premedicated before session, Repositioned    Home Living                          Prior Function            PT Goals (current goals can now be found in the care plan section) Progress towards PT goals: Progressing toward goals    Frequency    Min 2X/week      PT Plan      Co-evaluation              AM-PAC PT "6 Clicks" Mobility   Outcome Measure  Help needed turning from your back to your side while in a flat bed without using bedrails?: A Lot Help needed moving from lying on your back to sitting on the side of a flat bed without using bedrails?: A Lot Help needed moving to and from a bed to a chair (including a wheelchair)?: Total Help needed standing up from a chair using your arms (e.g., wheelchair or bedside chair)?: Total Help needed to walk in hospital room?: Total Help needed climbing 3-5 steps with a railing? : Total 6 Click Score: 8    End of Session Equipment Utilized During Treatment: Gait belt;Back brace Activity Tolerance: Patient tolerated treatment well Patient left: with chair alarm set;in chair;with call bell/phone within reach Nurse Communication: Mobility status PT Visit Diagnosis: Other abnormalities of gait and mobility (R26.89);Muscle weakness (generalized) (M62.81)     Time: 1610-9604 PT Time Calculation (min) (ACUTE ONLY): 18 min  Charges:    $Gait Training: 8-22 mins PT General Charges $$ ACUTE PT VISIT: 1 Visit                      Anise Salvo, PT Acute Rehab Services [facility] [phone]    Rayetta Humphrey 11/01/2023, 11:24 AM

## 2023-11-01 NOTE — Progress Notes (Addendum)
 PROGRESS NOTE    Allison Chavez  NFA:[id number] DOB: 05-Nov-1940 DOA: 10/29/2023 PCP: Shade Flood, MD    Brief Narrative:  Allison Chavez is a 83 y.o. female with medical history significant for dementia, anxiety, hypertension, hypothyroidism, and CKD 3B who presents with pain.   The patient had a fall at home 2 weeks ago. Since then, she has been screaming in pain when caregivers attempt to bathe her or change her clothes.  It has been difficult to determine the site of pain and the patient has difficulty providing history due to her dementia.    Assessment and Plan: T10 compression fracture  - Patient has been screaming in pain whenever caregivers move her ever since a fall 2 wks ago; she is found to have T10 compression fracture   - Neurosurgery recommended TLSO brace and outpatient follow-up but patient's friend is concerned that the patient will not have anyone to care for her at home  - Continue TLSO brace and pain-control, consult PT and TOC - -SNF placement -schedule tylenol for now for pain and PRN oxy (does not look like she has been requiring the IV or PO pain meds)    Dementia  - Delirium precautions   -unclear baseline    Anxiety  - Seroquel     Hypothyroidism  - Synthroid      CKD 3B  - Appears close to baseline  - Renally-dose medications    Acute urinary retention -foley placed in ER? -voiding trial   DVT prophylaxis: heparin injection 5,000 Units Start: 10/29/23 2200    Code Status: Full Code Family Communication: none at bedside  Disposition Plan:  Level of care: Med-Surg Status is: Observation     Consultants:  PT   Subjective: No overnight events  Objective: Vitals:   10/31/23 0913 10/31/23 1326 10/31/23 2113 11/01/23 0530  BP: 129/68 (!) 161/72 (!) 158/74 (!) 145/75  Pulse: 76 72 76 83  Resp: 16 18 15 16   Temp: 97.8 F (36.6 C) 98 F (36.7 C) 99.1 F (37.3 C) 99.6 F (37.6 C)  TempSrc:  Oral Oral Oral  SpO2:  99% 96% 98%   Weight:      Height:        Intake/Output Summary (Last 24 hours) at 11/01/2023 1209 Last data filed at 11/01/2023 0849 Gross per 24 hour  Intake 360 ml  Output 2550 ml  Net -2190 ml   Filed Weights   10/29/23 2217  Weight: 55.6 kg    Examination:     General: Appearance:    Well developed, well nourished female in no acute distress     Lungs:      respirations unlabored  Heart:    Normal heart rate. Normal rhythm. No murmurs, rubs, or gallops.   MS:   All extremities are intact.   Neurologic:   Awake, alert      Data Reviewed: I have personally reviewed following labs and imaging studies  CBC: Recent Labs  Lab 10/29/23 1538 10/30/23 0313 11/01/23 0312  WBC 6.3 9.5 8.3  NEUTROABS 2.6  --   --   HGB 9.6* 9.8* 8.6*  HCT 31.1* 31.2* 26.8*  MCV 98.4 97.8 97.1  PLT 209 219 180   Basic Metabolic Panel: Recent Labs  Lab 10/29/23 1538 10/30/23 0313 11/01/23 0312  NA 135 135 132*  K 4.2 3.9 3.9  CL 104 105 103  CO2 22 21* 21*  GLUCOSE 111* 103* 79  BUN 20 19 20  CREATININE 1.41* 1.41* 1.46*  CALCIUM 9.0 8.9 8.5*   GFR: Estimated Creatinine Clearance: 24.3 mL/min (A) (by C-G formula based on SCr of 1.46 mg/dL (H)). Liver Function Tests: Recent Labs  Lab 10/29/23 1538  AST 13*  ALT 10  ALKPHOS 76  BILITOT 0.6  PROT 6.7  ALBUMIN 3.4*   No results for input(s): "LIPASE", "AMYLASE" in the last 168 hours. No results for input(s): "AMMONIA" in the last 168 hours. Coagulation Profile: No results for input(s): "INR", "PROTIME" in the last 168 hours. Cardiac Enzymes: No results for input(s): "CKTOTAL", "CKMB", "CKMBINDEX", "TROPONINI" in the last 168 hours. BNP (last 3 results) No results for input(s): "PROBNP" in the last 8760 hours. HbA1C: No results for input(s): "HGBA1C" in the last 72 hours. CBG: No results for input(s): "GLUCAP" in the last 168 hours. Lipid Profile: No results for input(s): "CHOL", "HDL", "LDLCALC", "TRIG", "CHOLHDL", "LDLDIRECT"  in the last 72 hours. Thyroid Function Tests: No results for input(s): "TSH", "T4TOTAL", "FREET4", "T3FREE", "THYROIDAB" in the last 72 hours. Anemia Panel: No results for input(s): "VITAMINB12", "FOLATE", "FERRITIN", "TIBC", "IRON", "RETICCTPCT" in the last 72 hours. Sepsis Labs: No results for input(s): "PROCALCITON", "LATICACIDVEN" in the last 168 hours.  No results found for this or any previous visit (from the past 240 hours).       Radiology Studies: No results found.       Scheduled Meds:  acetaminophen  1,000 mg Oral TID   Chlorhexidine Gluconate Cloth  6 each Topical Daily   heparin  5,000 Units Subcutaneous Q8H   levothyroxine  25 mcg Oral Daily   lidocaine  1 patch Transdermal Q24H   Continuous Infusions:   LOS: 0 days    Time spent: 45 minutes spent on chart review, discussion with nursing staff, consultants, updating family and interview/physical exam; more than 50% of that time was spent in counseling and/or coordination of care.    Joseph Art, DO [facility] Available via Epic secure chat 7am-7pm After these hours, please refer to coverage provider listed on [email or web address] 11/01/2023, 12:09 PM

## 2023-11-01 NOTE — Progress Notes (Signed)
 PTAR here to pick up patient to transfer to [facility]. All belongings went with patient

## 2023-11-01 NOTE — TOC Transition Note (Signed)
 Transition of Care [facility]) - Discharge Note   Patient Details  Name: Allison Chavez MRN: [id number] Date of Birth: 1940/12/15  [facility]) CM/SW Contact:  Amada Jupiter, LCSW Phone Number: 11/01/2023, 4:12 PM   Clinical Narrative:     Spoke with pt yesterday afternoon and he confirmed preference to pursue SNF placement again and understands he will have copay charges.  He confirms that he cannot provide needed 24/7 care for pt and feels rehab is best option at this point.  Have reviewed SNF bed offers received today and spouse has chosen [facility].  Facility can accept pt today.  Have secure insurance authorization for SNF stay and pt has been medically cleared for dc.  Pt/ spouse aware and agreeable.  PTAR called at 4:15pm.  RN to call report to [phone].  No further TOC needs.  Final next level of care: Skilled Nursing Facility Barriers to Discharge: Barriers Resolved   Patient Goals and CMS Choice Patient states their goals for this hospitalization and ongoing recovery are:: go home   Choice offered to / list presented to : [facility] POA / Guardian [place] ownership interest in [facility].provided to:: [facility] POA / Guardian    Discharge Placement   Existing PASRR number confirmed : 10/31/23          Patient chooses bed at: [facility] Patient to be transferred to facility by: PTAR Name of family member notified: spouse Patient and family notified of of transfer: 11/01/23  Discharge Plan and Services Additional resources added to the After Visit Summary for   In-house Referral: Clinical Social Work   Post Acute Care Choice: Skilled Nursing Facility                               Social Drivers of Health (SDOH) Interventions SDOH Screenings   Food Insecurity: No Food Insecurity (10/29/2023)  Housing: Low Risk  (10/29/2023)  Transportation Needs: No Transportation Needs (10/29/2023)  Utilities: Not At Risk (10/29/2023)  Alcohol Screen:  Low Risk  (12/13/2022)  Depression (PHQ2-9): Low Risk  (06/26/2023)  Financial Resource Strain: Low Risk  (12/13/2022)  Physical Activity: Insufficiently Active (12/13/2022)  Social Connections: Socially Integrated (10/29/2023)  Stress: No Stress Concern Present (12/13/2022)  Tobacco Use: Medium Risk (10/29/2023)     Readmission Risk Interventions    09/23/2023    2:35 PM  Readmission Risk Prevention Plan  Transportation Screening Complete  PCP or Specialist Appt within 5-7 Days Complete  Home Care Screening Complete  Medication Review (RN CM) Complete

## 2023-11-01 NOTE — Plan of Care (Signed)
  Problem: Activity: Goal: Risk for activity intolerance will decrease Outcome: Progressing   Problem: Pain Managment: Goal: General experience of comfort will improve and/or be controlled Outcome: Progressing   Problem: Safety: Goal: Ability to remain free from injury will improve Outcome: Progressing   Problem: Skin Integrity: Goal: Risk for impaired skin integrity will decrease Outcome: Progressing

## 2023-11-02 DIAGNOSIS — M6281 Muscle weakness (generalized): Secondary | ICD-10-CM | POA: Diagnosis not present

## 2023-11-02 DIAGNOSIS — S22070D Wedge compression fracture of T9-T10 vertebra, subsequent encounter for fracture with routine healing: Secondary | ICD-10-CM | POA: Diagnosis not present

## 2023-11-04 DIAGNOSIS — Z79899 Other long term (current) drug therapy: Secondary | ICD-10-CM | POA: Diagnosis not present

## 2023-11-04 DIAGNOSIS — M6281 Muscle weakness (generalized): Secondary | ICD-10-CM | POA: Diagnosis not present

## 2023-11-05 DIAGNOSIS — M6281 Muscle weakness (generalized): Secondary | ICD-10-CM | POA: Diagnosis not present

## 2023-11-05 DIAGNOSIS — M24542 Contracture, left hand: Secondary | ICD-10-CM | POA: Diagnosis not present

## 2023-11-05 DIAGNOSIS — S22070A Wedge compression fracture of T9-T10 vertebra, initial encounter for closed fracture: Secondary | ICD-10-CM | POA: Diagnosis not present

## 2023-11-06 ENCOUNTER — Other Ambulatory Visit

## 2023-11-06 DIAGNOSIS — S22070D Wedge compression fracture of T9-T10 vertebra, subsequent encounter for fracture with routine healing: Secondary | ICD-10-CM | POA: Diagnosis not present

## 2023-11-06 DIAGNOSIS — R296 Repeated falls: Secondary | ICD-10-CM | POA: Diagnosis not present

## 2023-11-06 DIAGNOSIS — R2681 Unsteadiness on feet: Secondary | ICD-10-CM | POA: Diagnosis not present

## 2023-11-06 DIAGNOSIS — R262 Difficulty in walking, not elsewhere classified: Secondary | ICD-10-CM | POA: Diagnosis not present

## 2023-11-06 DIAGNOSIS — S22000D Wedge compression fracture of unspecified thoracic vertebra, subsequent encounter for fracture with routine healing: Secondary | ICD-10-CM | POA: Diagnosis not present

## 2023-11-06 DIAGNOSIS — M6281 Muscle weakness (generalized): Secondary | ICD-10-CM | POA: Diagnosis not present

## 2023-11-07 DIAGNOSIS — E039 Hypothyroidism, unspecified: Secondary | ICD-10-CM | POA: Diagnosis not present

## 2023-11-07 DIAGNOSIS — G308 Other Alzheimer's disease: Secondary | ICD-10-CM | POA: Diagnosis not present

## 2023-11-07 DIAGNOSIS — N183 Chronic kidney disease, stage 3 unspecified: Secondary | ICD-10-CM | POA: Diagnosis not present

## 2023-11-08 DIAGNOSIS — M24542 Contracture, left hand: Secondary | ICD-10-CM | POA: Diagnosis not present

## 2023-11-08 DIAGNOSIS — S22070D Wedge compression fracture of T9-T10 vertebra, subsequent encounter for fracture with routine healing: Secondary | ICD-10-CM | POA: Diagnosis not present

## 2023-11-08 DIAGNOSIS — M6281 Muscle weakness (generalized): Secondary | ICD-10-CM | POA: Diagnosis not present

## 2023-11-08 DIAGNOSIS — Z7409 Other reduced mobility: Secondary | ICD-10-CM | POA: Diagnosis not present

## 2023-11-10 DIAGNOSIS — M6281 Muscle weakness (generalized): Secondary | ICD-10-CM | POA: Diagnosis not present

## 2023-11-10 DIAGNOSIS — S22070D Wedge compression fracture of T9-T10 vertebra, subsequent encounter for fracture with routine healing: Secondary | ICD-10-CM | POA: Diagnosis not present

## 2023-11-10 DIAGNOSIS — Z7409 Other reduced mobility: Secondary | ICD-10-CM | POA: Diagnosis not present

## 2023-11-14 DIAGNOSIS — Z7409 Other reduced mobility: Secondary | ICD-10-CM | POA: Diagnosis not present

## 2023-11-14 DIAGNOSIS — S22070D Wedge compression fracture of T9-T10 vertebra, subsequent encounter for fracture with routine healing: Secondary | ICD-10-CM | POA: Diagnosis not present

## 2023-11-14 DIAGNOSIS — M6281 Muscle weakness (generalized): Secondary | ICD-10-CM | POA: Diagnosis not present

## 2023-11-14 DIAGNOSIS — N183 Chronic kidney disease, stage 3 unspecified: Secondary | ICD-10-CM | POA: Diagnosis not present

## 2023-11-18 ENCOUNTER — Ambulatory Visit: Admitting: Family Medicine

## 2023-11-18 ENCOUNTER — Telehealth: Payer: Self-pay | Admitting: Family Medicine

## 2023-11-18 NOTE — Telephone Encounter (Signed)
 Placed in folder

## 2023-11-18 NOTE — Telephone Encounter (Signed)
 Paperwork completed and placed in fax bin at back nurse station

## 2023-11-18 NOTE — Telephone Encounter (Signed)
 [facility] sent us  orders on current patient, Paper work needs signatures and sent back to its designated place. Paper work is placed in [employer] loss adjuster.  Please advise, Thanks

## 2023-11-19 DIAGNOSIS — W19XXXA Unspecified fall, initial encounter: Secondary | ICD-10-CM | POA: Diagnosis not present

## 2023-11-19 DIAGNOSIS — Z7409 Other reduced mobility: Secondary | ICD-10-CM | POA: Diagnosis not present

## 2023-11-19 DIAGNOSIS — M6281 Muscle weakness (generalized): Secondary | ICD-10-CM | POA: Diagnosis not present

## 2023-11-20 ENCOUNTER — Ambulatory Visit: Admitting: Family Medicine

## 2023-11-20 DIAGNOSIS — S22000D Wedge compression fracture of unspecified thoracic vertebra, subsequent encounter for fracture with routine healing: Secondary | ICD-10-CM | POA: Diagnosis not present

## 2023-11-20 DIAGNOSIS — R2681 Unsteadiness on feet: Secondary | ICD-10-CM | POA: Diagnosis not present

## 2023-11-20 DIAGNOSIS — R262 Difficulty in walking, not elsewhere classified: Secondary | ICD-10-CM | POA: Diagnosis not present

## 2023-11-23 DIAGNOSIS — R52 Pain, unspecified: Secondary | ICD-10-CM | POA: Diagnosis not present

## 2023-11-23 DIAGNOSIS — M6281 Muscle weakness (generalized): Secondary | ICD-10-CM | POA: Diagnosis not present

## 2023-11-23 DIAGNOSIS — S22070D Wedge compression fracture of T9-T10 vertebra, subsequent encounter for fracture with routine healing: Secondary | ICD-10-CM | POA: Diagnosis not present

## 2023-11-23 DIAGNOSIS — R296 Repeated falls: Secondary | ICD-10-CM | POA: Diagnosis not present

## 2023-11-23 DIAGNOSIS — Z7409 Other reduced mobility: Secondary | ICD-10-CM | POA: Diagnosis not present

## 2023-11-27 ENCOUNTER — Other Ambulatory Visit

## 2023-11-27 DIAGNOSIS — R21 Rash and other nonspecific skin eruption: Secondary | ICD-10-CM | POA: Diagnosis not present

## 2023-11-27 DIAGNOSIS — M6281 Muscle weakness (generalized): Secondary | ICD-10-CM | POA: Diagnosis not present

## 2023-11-27 DIAGNOSIS — Z7409 Other reduced mobility: Secondary | ICD-10-CM | POA: Diagnosis not present

## 2023-11-29 DIAGNOSIS — M6281 Muscle weakness (generalized): Secondary | ICD-10-CM | POA: Diagnosis not present

## 2023-11-29 DIAGNOSIS — Z7409 Other reduced mobility: Secondary | ICD-10-CM | POA: Diagnosis not present

## 2023-11-29 DIAGNOSIS — R197 Diarrhea, unspecified: Secondary | ICD-10-CM | POA: Diagnosis not present

## 2023-11-29 DIAGNOSIS — R21 Rash and other nonspecific skin eruption: Secondary | ICD-10-CM | POA: Diagnosis not present

## 2023-12-03 DIAGNOSIS — M6281 Muscle weakness (generalized): Secondary | ICD-10-CM | POA: Diagnosis not present

## 2023-12-03 DIAGNOSIS — S22070D Wedge compression fracture of T9-T10 vertebra, subsequent encounter for fracture with routine healing: Secondary | ICD-10-CM | POA: Diagnosis not present

## 2023-12-03 DIAGNOSIS — Z7409 Other reduced mobility: Secondary | ICD-10-CM | POA: Diagnosis not present

## 2023-12-06 DIAGNOSIS — N183 Chronic kidney disease, stage 3 unspecified: Secondary | ICD-10-CM | POA: Diagnosis not present

## 2023-12-06 DIAGNOSIS — M6281 Muscle weakness (generalized): Secondary | ICD-10-CM | POA: Diagnosis not present

## 2023-12-06 DIAGNOSIS — R52 Pain, unspecified: Secondary | ICD-10-CM | POA: Diagnosis not present

## 2023-12-06 DIAGNOSIS — R21 Rash and other nonspecific skin eruption: Secondary | ICD-10-CM | POA: Diagnosis not present

## 2023-12-06 DIAGNOSIS — Z7409 Other reduced mobility: Secondary | ICD-10-CM | POA: Diagnosis not present

## 2023-12-10 DIAGNOSIS — Z7409 Other reduced mobility: Secondary | ICD-10-CM | POA: Diagnosis not present

## 2023-12-10 DIAGNOSIS — R21 Rash and other nonspecific skin eruption: Secondary | ICD-10-CM | POA: Diagnosis not present

## 2023-12-10 DIAGNOSIS — M6281 Muscle weakness (generalized): Secondary | ICD-10-CM | POA: Diagnosis not present

## 2023-12-11 DIAGNOSIS — R2681 Unsteadiness on feet: Secondary | ICD-10-CM | POA: Diagnosis not present

## 2023-12-11 DIAGNOSIS — R262 Difficulty in walking, not elsewhere classified: Secondary | ICD-10-CM | POA: Diagnosis not present

## 2023-12-11 DIAGNOSIS — M6281 Muscle weakness (generalized): Secondary | ICD-10-CM | POA: Diagnosis not present

## 2023-12-11 DIAGNOSIS — S22000D Wedge compression fracture of unspecified thoracic vertebra, subsequent encounter for fracture with routine healing: Secondary | ICD-10-CM | POA: Diagnosis not present

## 2023-12-12 DIAGNOSIS — S22000D Wedge compression fracture of unspecified thoracic vertebra, subsequent encounter for fracture with routine healing: Secondary | ICD-10-CM | POA: Diagnosis not present

## 2023-12-12 DIAGNOSIS — R262 Difficulty in walking, not elsewhere classified: Secondary | ICD-10-CM | POA: Diagnosis not present

## 2023-12-12 DIAGNOSIS — L309 Dermatitis, unspecified: Secondary | ICD-10-CM | POA: Diagnosis not present

## 2023-12-12 DIAGNOSIS — R2681 Unsteadiness on feet: Secondary | ICD-10-CM | POA: Diagnosis not present

## 2023-12-12 DIAGNOSIS — M6281 Muscle weakness (generalized): Secondary | ICD-10-CM | POA: Diagnosis not present

## 2023-12-13 DIAGNOSIS — S22000D Wedge compression fracture of unspecified thoracic vertebra, subsequent encounter for fracture with routine healing: Secondary | ICD-10-CM | POA: Diagnosis not present

## 2023-12-13 DIAGNOSIS — M6281 Muscle weakness (generalized): Secondary | ICD-10-CM | POA: Diagnosis not present

## 2023-12-13 DIAGNOSIS — R262 Difficulty in walking, not elsewhere classified: Secondary | ICD-10-CM | POA: Diagnosis not present

## 2023-12-13 DIAGNOSIS — R2681 Unsteadiness on feet: Secondary | ICD-10-CM | POA: Diagnosis not present

## 2023-12-16 DIAGNOSIS — R262 Difficulty in walking, not elsewhere classified: Secondary | ICD-10-CM | POA: Diagnosis not present

## 2023-12-16 DIAGNOSIS — M6281 Muscle weakness (generalized): Secondary | ICD-10-CM | POA: Diagnosis not present

## 2023-12-16 DIAGNOSIS — S22070A Wedge compression fracture of T9-T10 vertebra, initial encounter for closed fracture: Secondary | ICD-10-CM | POA: Diagnosis not present

## 2023-12-16 DIAGNOSIS — S22000D Wedge compression fracture of unspecified thoracic vertebra, subsequent encounter for fracture with routine healing: Secondary | ICD-10-CM | POA: Diagnosis not present

## 2023-12-16 DIAGNOSIS — R2681 Unsteadiness on feet: Secondary | ICD-10-CM | POA: Diagnosis not present

## 2023-12-17 DIAGNOSIS — M6281 Muscle weakness (generalized): Secondary | ICD-10-CM | POA: Diagnosis not present

## 2023-12-17 DIAGNOSIS — R2681 Unsteadiness on feet: Secondary | ICD-10-CM | POA: Diagnosis not present

## 2023-12-17 DIAGNOSIS — R262 Difficulty in walking, not elsewhere classified: Secondary | ICD-10-CM | POA: Diagnosis not present

## 2023-12-17 DIAGNOSIS — S22000D Wedge compression fracture of unspecified thoracic vertebra, subsequent encounter for fracture with routine healing: Secondary | ICD-10-CM | POA: Diagnosis not present

## 2023-12-18 DIAGNOSIS — S22000D Wedge compression fracture of unspecified thoracic vertebra, subsequent encounter for fracture with routine healing: Secondary | ICD-10-CM | POA: Diagnosis not present

## 2023-12-18 DIAGNOSIS — R2681 Unsteadiness on feet: Secondary | ICD-10-CM | POA: Diagnosis not present

## 2023-12-18 DIAGNOSIS — R262 Difficulty in walking, not elsewhere classified: Secondary | ICD-10-CM | POA: Diagnosis not present

## 2023-12-18 DIAGNOSIS — M6281 Muscle weakness (generalized): Secondary | ICD-10-CM | POA: Diagnosis not present

## 2023-12-18 DIAGNOSIS — Z7409 Other reduced mobility: Secondary | ICD-10-CM | POA: Diagnosis not present

## 2023-12-18 DIAGNOSIS — L299 Pruritus, unspecified: Secondary | ICD-10-CM | POA: Diagnosis not present

## 2023-12-18 DIAGNOSIS — L309 Dermatitis, unspecified: Secondary | ICD-10-CM | POA: Diagnosis not present

## 2023-12-19 DIAGNOSIS — R2681 Unsteadiness on feet: Secondary | ICD-10-CM | POA: Diagnosis not present

## 2023-12-19 DIAGNOSIS — R52 Pain, unspecified: Secondary | ICD-10-CM | POA: Diagnosis not present

## 2023-12-19 DIAGNOSIS — M6281 Muscle weakness (generalized): Secondary | ICD-10-CM | POA: Diagnosis not present

## 2023-12-19 DIAGNOSIS — S22000D Wedge compression fracture of unspecified thoracic vertebra, subsequent encounter for fracture with routine healing: Secondary | ICD-10-CM | POA: Diagnosis not present

## 2023-12-19 DIAGNOSIS — L309 Dermatitis, unspecified: Secondary | ICD-10-CM | POA: Diagnosis not present

## 2023-12-19 DIAGNOSIS — Z7409 Other reduced mobility: Secondary | ICD-10-CM | POA: Diagnosis not present

## 2023-12-19 DIAGNOSIS — R296 Repeated falls: Secondary | ICD-10-CM | POA: Diagnosis not present

## 2023-12-19 DIAGNOSIS — R262 Difficulty in walking, not elsewhere classified: Secondary | ICD-10-CM | POA: Diagnosis not present

## 2023-12-19 DIAGNOSIS — S22070D Wedge compression fracture of T9-T10 vertebra, subsequent encounter for fracture with routine healing: Secondary | ICD-10-CM | POA: Diagnosis not present

## 2023-12-20 DIAGNOSIS — R262 Difficulty in walking, not elsewhere classified: Secondary | ICD-10-CM | POA: Diagnosis not present

## 2023-12-20 DIAGNOSIS — R2681 Unsteadiness on feet: Secondary | ICD-10-CM | POA: Diagnosis not present

## 2023-12-20 DIAGNOSIS — S22000D Wedge compression fracture of unspecified thoracic vertebra, subsequent encounter for fracture with routine healing: Secondary | ICD-10-CM | POA: Diagnosis not present

## 2023-12-20 DIAGNOSIS — Z7409 Other reduced mobility: Secondary | ICD-10-CM | POA: Diagnosis not present

## 2023-12-20 DIAGNOSIS — M6281 Muscle weakness (generalized): Secondary | ICD-10-CM | POA: Diagnosis not present

## 2023-12-20 DIAGNOSIS — L309 Dermatitis, unspecified: Secondary | ICD-10-CM | POA: Diagnosis not present

## 2023-12-20 DIAGNOSIS — L299 Pruritus, unspecified: Secondary | ICD-10-CM | POA: Diagnosis not present

## 2023-12-21 DIAGNOSIS — M6281 Muscle weakness (generalized): Secondary | ICD-10-CM | POA: Diagnosis not present

## 2023-12-21 DIAGNOSIS — R2681 Unsteadiness on feet: Secondary | ICD-10-CM | POA: Diagnosis not present

## 2023-12-21 DIAGNOSIS — R262 Difficulty in walking, not elsewhere classified: Secondary | ICD-10-CM | POA: Diagnosis not present

## 2023-12-21 DIAGNOSIS — S22000D Wedge compression fracture of unspecified thoracic vertebra, subsequent encounter for fracture with routine healing: Secondary | ICD-10-CM | POA: Diagnosis not present

## 2023-12-23 DIAGNOSIS — R2681 Unsteadiness on feet: Secondary | ICD-10-CM | POA: Diagnosis not present

## 2023-12-23 DIAGNOSIS — Z7409 Other reduced mobility: Secondary | ICD-10-CM | POA: Diagnosis not present

## 2023-12-23 DIAGNOSIS — L853 Xerosis cutis: Secondary | ICD-10-CM | POA: Diagnosis not present

## 2023-12-23 DIAGNOSIS — M6281 Muscle weakness (generalized): Secondary | ICD-10-CM | POA: Diagnosis not present

## 2023-12-23 DIAGNOSIS — S22000D Wedge compression fracture of unspecified thoracic vertebra, subsequent encounter for fracture with routine healing: Secondary | ICD-10-CM | POA: Diagnosis not present

## 2023-12-23 DIAGNOSIS — R262 Difficulty in walking, not elsewhere classified: Secondary | ICD-10-CM | POA: Diagnosis not present

## 2023-12-23 DIAGNOSIS — L309 Dermatitis, unspecified: Secondary | ICD-10-CM | POA: Diagnosis not present

## 2023-12-24 DIAGNOSIS — R2681 Unsteadiness on feet: Secondary | ICD-10-CM | POA: Diagnosis not present

## 2023-12-24 DIAGNOSIS — S22000D Wedge compression fracture of unspecified thoracic vertebra, subsequent encounter for fracture with routine healing: Secondary | ICD-10-CM | POA: Diagnosis not present

## 2023-12-24 DIAGNOSIS — R262 Difficulty in walking, not elsewhere classified: Secondary | ICD-10-CM | POA: Diagnosis not present

## 2023-12-24 DIAGNOSIS — M6281 Muscle weakness (generalized): Secondary | ICD-10-CM | POA: Diagnosis not present

## 2023-12-25 DIAGNOSIS — Z7409 Other reduced mobility: Secondary | ICD-10-CM | POA: Diagnosis not present

## 2023-12-25 DIAGNOSIS — L309 Dermatitis, unspecified: Secondary | ICD-10-CM | POA: Diagnosis not present

## 2023-12-25 DIAGNOSIS — R2681 Unsteadiness on feet: Secondary | ICD-10-CM | POA: Diagnosis not present

## 2023-12-25 DIAGNOSIS — M6281 Muscle weakness (generalized): Secondary | ICD-10-CM | POA: Diagnosis not present

## 2023-12-25 DIAGNOSIS — S22000D Wedge compression fracture of unspecified thoracic vertebra, subsequent encounter for fracture with routine healing: Secondary | ICD-10-CM | POA: Diagnosis not present

## 2023-12-25 DIAGNOSIS — R262 Difficulty in walking, not elsewhere classified: Secondary | ICD-10-CM | POA: Diagnosis not present

## 2023-12-26 DIAGNOSIS — R2681 Unsteadiness on feet: Secondary | ICD-10-CM | POA: Diagnosis not present

## 2023-12-26 DIAGNOSIS — M6281 Muscle weakness (generalized): Secondary | ICD-10-CM | POA: Diagnosis not present

## 2023-12-26 DIAGNOSIS — R262 Difficulty in walking, not elsewhere classified: Secondary | ICD-10-CM | POA: Diagnosis not present

## 2023-12-26 DIAGNOSIS — S22000D Wedge compression fracture of unspecified thoracic vertebra, subsequent encounter for fracture with routine healing: Secondary | ICD-10-CM | POA: Diagnosis not present

## 2023-12-27 DIAGNOSIS — S22000D Wedge compression fracture of unspecified thoracic vertebra, subsequent encounter for fracture with routine healing: Secondary | ICD-10-CM | POA: Diagnosis not present

## 2023-12-27 DIAGNOSIS — L309 Dermatitis, unspecified: Secondary | ICD-10-CM | POA: Diagnosis not present

## 2023-12-27 DIAGNOSIS — Z7409 Other reduced mobility: Secondary | ICD-10-CM | POA: Diagnosis not present

## 2023-12-27 DIAGNOSIS — M6281 Muscle weakness (generalized): Secondary | ICD-10-CM | POA: Diagnosis not present

## 2023-12-27 DIAGNOSIS — R262 Difficulty in walking, not elsewhere classified: Secondary | ICD-10-CM | POA: Diagnosis not present

## 2023-12-27 DIAGNOSIS — R2681 Unsteadiness on feet: Secondary | ICD-10-CM | POA: Diagnosis not present

## 2023-12-27 DIAGNOSIS — L853 Xerosis cutis: Secondary | ICD-10-CM | POA: Diagnosis not present

## 2023-12-30 DIAGNOSIS — S22000D Wedge compression fracture of unspecified thoracic vertebra, subsequent encounter for fracture with routine healing: Secondary | ICD-10-CM | POA: Diagnosis not present

## 2023-12-30 DIAGNOSIS — M6281 Muscle weakness (generalized): Secondary | ICD-10-CM | POA: Diagnosis not present

## 2023-12-30 DIAGNOSIS — R2681 Unsteadiness on feet: Secondary | ICD-10-CM | POA: Diagnosis not present

## 2023-12-30 DIAGNOSIS — W19XXXA Unspecified fall, initial encounter: Secondary | ICD-10-CM | POA: Diagnosis not present

## 2023-12-30 DIAGNOSIS — R262 Difficulty in walking, not elsewhere classified: Secondary | ICD-10-CM | POA: Diagnosis not present

## 2023-12-30 DIAGNOSIS — Z7409 Other reduced mobility: Secondary | ICD-10-CM | POA: Diagnosis not present

## 2023-12-31 DIAGNOSIS — M6281 Muscle weakness (generalized): Secondary | ICD-10-CM | POA: Diagnosis not present

## 2023-12-31 DIAGNOSIS — R262 Difficulty in walking, not elsewhere classified: Secondary | ICD-10-CM | POA: Diagnosis not present

## 2023-12-31 DIAGNOSIS — S22000D Wedge compression fracture of unspecified thoracic vertebra, subsequent encounter for fracture with routine healing: Secondary | ICD-10-CM | POA: Diagnosis not present

## 2023-12-31 DIAGNOSIS — R2681 Unsteadiness on feet: Secondary | ICD-10-CM | POA: Diagnosis not present

## 2024-01-01 DIAGNOSIS — M6281 Muscle weakness (generalized): Secondary | ICD-10-CM | POA: Diagnosis not present

## 2024-01-01 DIAGNOSIS — S22000D Wedge compression fracture of unspecified thoracic vertebra, subsequent encounter for fracture with routine healing: Secondary | ICD-10-CM | POA: Diagnosis not present

## 2024-01-01 DIAGNOSIS — Z7409 Other reduced mobility: Secondary | ICD-10-CM | POA: Diagnosis not present

## 2024-01-01 DIAGNOSIS — R262 Difficulty in walking, not elsewhere classified: Secondary | ICD-10-CM | POA: Diagnosis not present

## 2024-01-01 DIAGNOSIS — R2681 Unsteadiness on feet: Secondary | ICD-10-CM | POA: Diagnosis not present

## 2024-01-01 DIAGNOSIS — W19XXXA Unspecified fall, initial encounter: Secondary | ICD-10-CM | POA: Diagnosis not present

## 2024-01-02 DIAGNOSIS — R2681 Unsteadiness on feet: Secondary | ICD-10-CM | POA: Diagnosis not present

## 2024-01-02 DIAGNOSIS — M6281 Muscle weakness (generalized): Secondary | ICD-10-CM | POA: Diagnosis not present

## 2024-01-02 DIAGNOSIS — R262 Difficulty in walking, not elsewhere classified: Secondary | ICD-10-CM | POA: Diagnosis not present

## 2024-01-02 DIAGNOSIS — S22000D Wedge compression fracture of unspecified thoracic vertebra, subsequent encounter for fracture with routine healing: Secondary | ICD-10-CM | POA: Diagnosis not present

## 2024-01-03 DIAGNOSIS — R2681 Unsteadiness on feet: Secondary | ICD-10-CM | POA: Diagnosis not present

## 2024-01-03 DIAGNOSIS — R262 Difficulty in walking, not elsewhere classified: Secondary | ICD-10-CM | POA: Diagnosis not present

## 2024-01-03 DIAGNOSIS — S22000D Wedge compression fracture of unspecified thoracic vertebra, subsequent encounter for fracture with routine healing: Secondary | ICD-10-CM | POA: Diagnosis not present

## 2024-01-03 DIAGNOSIS — M6281 Muscle weakness (generalized): Secondary | ICD-10-CM | POA: Diagnosis not present

## 2024-01-04 DIAGNOSIS — Z741 Need for assistance with personal care: Secondary | ICD-10-CM | POA: Diagnosis not present

## 2024-01-04 DIAGNOSIS — M6281 Muscle weakness (generalized): Secondary | ICD-10-CM | POA: Diagnosis not present

## 2024-01-04 DIAGNOSIS — R262 Difficulty in walking, not elsewhere classified: Secondary | ICD-10-CM | POA: Diagnosis not present

## 2024-01-04 DIAGNOSIS — L309 Dermatitis, unspecified: Secondary | ICD-10-CM | POA: Diagnosis not present

## 2024-01-04 DIAGNOSIS — Z7409 Other reduced mobility: Secondary | ICD-10-CM | POA: Diagnosis not present

## 2024-01-04 DIAGNOSIS — R2681 Unsteadiness on feet: Secondary | ICD-10-CM | POA: Diagnosis not present

## 2024-01-04 DIAGNOSIS — S22000D Wedge compression fracture of unspecified thoracic vertebra, subsequent encounter for fracture with routine healing: Secondary | ICD-10-CM | POA: Diagnosis not present

## 2024-01-05 DIAGNOSIS — S22000D Wedge compression fracture of unspecified thoracic vertebra, subsequent encounter for fracture with routine healing: Secondary | ICD-10-CM | POA: Diagnosis not present

## 2024-01-05 DIAGNOSIS — R262 Difficulty in walking, not elsewhere classified: Secondary | ICD-10-CM | POA: Diagnosis not present

## 2024-01-05 DIAGNOSIS — M6281 Muscle weakness (generalized): Secondary | ICD-10-CM | POA: Diagnosis not present

## 2024-01-05 DIAGNOSIS — R2681 Unsteadiness on feet: Secondary | ICD-10-CM | POA: Diagnosis not present

## 2024-01-06 DIAGNOSIS — R262 Difficulty in walking, not elsewhere classified: Secondary | ICD-10-CM | POA: Diagnosis not present

## 2024-01-06 DIAGNOSIS — S22000D Wedge compression fracture of unspecified thoracic vertebra, subsequent encounter for fracture with routine healing: Secondary | ICD-10-CM | POA: Diagnosis not present

## 2024-01-06 DIAGNOSIS — R2681 Unsteadiness on feet: Secondary | ICD-10-CM | POA: Diagnosis not present

## 2024-01-06 DIAGNOSIS — Z7409 Other reduced mobility: Secondary | ICD-10-CM | POA: Diagnosis not present

## 2024-01-06 DIAGNOSIS — Z741 Need for assistance with personal care: Secondary | ICD-10-CM | POA: Diagnosis not present

## 2024-01-06 DIAGNOSIS — W19XXXA Unspecified fall, initial encounter: Secondary | ICD-10-CM | POA: Diagnosis not present

## 2024-01-06 DIAGNOSIS — M6281 Muscle weakness (generalized): Secondary | ICD-10-CM | POA: Diagnosis not present

## 2024-01-07 DIAGNOSIS — S22000D Wedge compression fracture of unspecified thoracic vertebra, subsequent encounter for fracture with routine healing: Secondary | ICD-10-CM | POA: Diagnosis not present

## 2024-01-07 DIAGNOSIS — M6281 Muscle weakness (generalized): Secondary | ICD-10-CM | POA: Diagnosis not present

## 2024-01-07 DIAGNOSIS — R2681 Unsteadiness on feet: Secondary | ICD-10-CM | POA: Diagnosis not present

## 2024-01-07 DIAGNOSIS — R262 Difficulty in walking, not elsewhere classified: Secondary | ICD-10-CM | POA: Diagnosis not present

## 2024-01-11 DIAGNOSIS — M6281 Muscle weakness (generalized): Secondary | ICD-10-CM | POA: Diagnosis not present

## 2024-01-11 DIAGNOSIS — Z7409 Other reduced mobility: Secondary | ICD-10-CM | POA: Diagnosis not present

## 2024-01-14 DIAGNOSIS — L309 Dermatitis, unspecified: Secondary | ICD-10-CM | POA: Diagnosis not present

## 2024-01-14 DIAGNOSIS — L853 Xerosis cutis: Secondary | ICD-10-CM | POA: Diagnosis not present

## 2024-01-14 DIAGNOSIS — L299 Pruritus, unspecified: Secondary | ICD-10-CM | POA: Diagnosis not present

## 2024-01-14 DIAGNOSIS — Z7409 Other reduced mobility: Secondary | ICD-10-CM | POA: Diagnosis not present

## 2024-01-14 DIAGNOSIS — M6281 Muscle weakness (generalized): Secondary | ICD-10-CM | POA: Diagnosis not present

## 2024-01-14 DIAGNOSIS — Z741 Need for assistance with personal care: Secondary | ICD-10-CM | POA: Diagnosis not present

## 2024-01-17 DIAGNOSIS — L853 Xerosis cutis: Secondary | ICD-10-CM | POA: Diagnosis not present

## 2024-01-17 DIAGNOSIS — Z7409 Other reduced mobility: Secondary | ICD-10-CM | POA: Diagnosis not present

## 2024-01-17 DIAGNOSIS — M6281 Muscle weakness (generalized): Secondary | ICD-10-CM | POA: Diagnosis not present

## 2024-01-17 DIAGNOSIS — Z741 Need for assistance with personal care: Secondary | ICD-10-CM | POA: Diagnosis not present

## 2024-01-17 DIAGNOSIS — L299 Pruritus, unspecified: Secondary | ICD-10-CM | POA: Diagnosis not present

## 2024-01-17 DIAGNOSIS — L309 Dermatitis, unspecified: Secondary | ICD-10-CM | POA: Diagnosis not present
